# Patient Record
Sex: Female | Born: 1965
Health system: Southern US, Community
[De-identification: ages and names within clinical notes are randomized; demographics above are authoritative.]

## PROBLEM LIST (undated history)

## (undated) DIAGNOSIS — M329 Systemic lupus erythematosus, unspecified: Secondary | ICD-10-CM

## (undated) DIAGNOSIS — M069 Rheumatoid arthritis, unspecified: Secondary | ICD-10-CM

## (undated) DIAGNOSIS — D135 Benign neoplasm of extrahepatic bile ducts: Secondary | ICD-10-CM

## (undated) DIAGNOSIS — G43009 Migraine without aura, not intractable, without status migrainosus: Principal | ICD-10-CM

## (undated) DIAGNOSIS — R51 Headache: Secondary | ICD-10-CM

## (undated) DIAGNOSIS — M1990 Unspecified osteoarthritis, unspecified site: Secondary | ICD-10-CM

## (undated) DIAGNOSIS — R209 Unspecified disturbances of skin sensation: Secondary | ICD-10-CM

## (undated) DIAGNOSIS — R519 Headache, unspecified: Secondary | ICD-10-CM

## (undated) DIAGNOSIS — IMO0002 Reserved for concepts with insufficient information to code with codable children: Secondary | ICD-10-CM

## (undated) HISTORY — DX: Headache: R51

## (undated) HISTORY — PX: CARPAL TUNNEL RELEASE: SHX101

## (undated) HISTORY — DX: Unspecified osteoarthritis, unspecified site: M19.90

## (undated) HISTORY — DX: Headache, unspecified: R51.9

## (undated) HISTORY — DX: Rheumatoid arthritis, unspecified: M06.9

## (undated) HISTORY — DX: Migraine without aura, not intractable, without status migrainosus: G43.009

## (undated) HISTORY — DX: Benign neoplasm of extrahepatic bile ducts: D13.5

## (undated) HISTORY — DX: Unspecified disturbances of skin sensation: R20.9

---

## 2005-01-09 ENCOUNTER — Encounter: Admission: RE | Admit: 2005-01-09 | Discharge: 2005-01-09 | Payer: Self-pay | Admitting: Otolaryngology

## 2008-04-28 ENCOUNTER — Other Ambulatory Visit: Admission: RE | Admit: 2008-04-28 | Discharge: 2008-04-28 | Payer: Self-pay | Admitting: Gynecology

## 2012-09-19 ENCOUNTER — Ambulatory Visit (INDEPENDENT_AMBULATORY_CARE_PROVIDER_SITE_OTHER): Payer: PRIVATE HEALTH INSURANCE | Admitting: Family Medicine

## 2012-09-19 VITALS — BP 116/74 | HR 64 | Temp 97.6°F | Resp 16 | Ht 62.0 in | Wt 106.0 lb

## 2012-09-19 DIAGNOSIS — B009 Herpesviral infection, unspecified: Secondary | ICD-10-CM

## 2012-09-19 DIAGNOSIS — R21 Rash and other nonspecific skin eruption: Secondary | ICD-10-CM

## 2012-09-19 MED ORDER — VALACYCLOVIR HCL 1 G PO TABS: ORAL_TABLET | ORAL | Status: DC

## 2012-09-19 NOTE — Progress Notes (Signed)
  [facility]:  Office Visit  Chief Complaint:  Chief Complaint  Patient presents with  . Rash    bumps on lips x 2 days; painful    HPI: Kathleen Costa is a 47 y.o. female who complains of : Needs forms filled out for employment for health screening but declines bloodwork, scared of needles.  Painful vesicles, blisters on lips x 2 day. Started as 1 vesicles and now is all over upper and lower lips. Has had canker sores before inside mouth. Denies any genital herpes.   History reviewed. No pertinent past medical history. History reviewed. No pertinent past surgical history. History   Social History  . Marital Status: Single    Spouse Name: N/A    Number of Children: N/A  . Years of Education: N/A   Social History Main Topics  . Smoking status: Never Smoker   . Smokeless tobacco: None  . Alcohol Use: No  . Drug Use: No  . Sexually Active: None   Other Topics Concern  . None   Social History Narrative  . None   History reviewed. No pertinent family history. No Known Allergies Prior to Admission medications   Not on File     ROS: The patient denies fevers, chills, night sweats, unintentional weight loss, chest pain, palpitations, wheezing, dyspnea on exertion, nausea, vomiting, abdominal pain, dysuria, hematuria, melena, numbness, weakness, or tingling.   All other systems have been reviewed and were otherwise negative with the exception of those mentioned in the HPI and as above.    PHYSICAL EXAM: Filed Vitals:   09/19/12 1727  BP: 116/74  Pulse: 64  Temp: 97.6 F (36.4 C)  Resp: 16   Filed Vitals:   09/19/12 1727  Height: 5\' 2"  (1.575 m)  Weight: 106 lb (48.081 kg)   Body mass index is 19.39 kg/(m^2).  General: Alert, no acute distress HEENT:  Normocephalic, atraumatic, oropharynx patent.  Cardiovascular:  Regular rate and rhythm, no rubs murmurs or gallops.  No Carotid bruits, radial pulse intact. No pedal edema.  Respiratory:  Clear to auscultation bilaterally.  No wheezes, rales, or rhonchi.  No cyanosis, no use of accessory musculature GI: No organomegaly, abdomen is soft and non-tender, positive bowel sounds.  No masses. Skin: + fever blisters on upper and lower lip Neurologic: Facial musculature symmetric. Psychiatric: Patient is appropriate throughout our interaction. Lymphatic: No cervical lymphadenopathy Musculoskeletal: Gait intact.   LABS: No results found for this or any previous visit.   EKG/XRAY:   Primary read interpreted by Dr. Conley Rolls at [facility].   ASSESSMENT/PLAN: Encounter Diagnoses  Name Primary?  Marland Kitchen HSV-1 (herpes simplex virus 1) infection Yes  . Rash    Filled out form. Rx Valtrex OTC Abreva F/u prn     LE, THAO PHUONG, DO 09/19/2012 5:50 PM

## 2013-02-02 ENCOUNTER — Ambulatory Visit (INDEPENDENT_AMBULATORY_CARE_PROVIDER_SITE_OTHER): Payer: PRIVATE HEALTH INSURANCE | Admitting: Emergency Medicine

## 2013-02-02 VITALS — BP 90/60 | HR 88 | Temp 98.0°F | Resp 16 | Ht 61.0 in | Wt 99.0 lb

## 2013-02-02 DIAGNOSIS — M654 Radial styloid tenosynovitis [de Quervain]: Secondary | ICD-10-CM

## 2013-02-02 DIAGNOSIS — R112 Nausea with vomiting, unspecified: Secondary | ICD-10-CM

## 2013-02-02 LAB — COMPREHENSIVE METABOLIC PANEL
ALT: 14 U/L (ref 0–35)
AST: 21 U/L (ref 0–37)
Albumin: 4.4 g/dL (ref 3.5–5.2)
Alkaline Phosphatase: 84 U/L (ref 39–117)
BUN: 12 mg/dL (ref 6–23)
CO2: 23 mEq/L (ref 19–32)
Calcium: 9.1 mg/dL (ref 8.4–10.5)
Chloride: 105 mEq/L (ref 96–112)
Creat: 0.65 mg/dL (ref 0.50–1.10)
Glucose, Bld: 105 mg/dL — ABNORMAL HIGH (ref 70–99)
Potassium: 4.2 mEq/L (ref 3.5–5.3)
Sodium: 136 mEq/L (ref 135–145)
Total Bilirubin: 0.7 mg/dL (ref 0.3–1.2)
Total Protein: 8 g/dL (ref 6.0–8.3)

## 2013-02-02 LAB — POCT URINALYSIS DIPSTICK
Bilirubin, UA: NEGATIVE
Blood, UA: NEGATIVE
Glucose, UA: NEGATIVE
Ketones, UA: NEGATIVE
Leukocytes, UA: NEGATIVE
Nitrite, UA: NEGATIVE
Protein, UA: NEGATIVE
Spec Grav, UA: 1.02
Urobilinogen, UA: 0.2
pH, UA: 5.5

## 2013-02-02 LAB — POCT UA - MICROSCOPIC ONLY
Amorphous: POSITIVE
Bacteria, U Microscopic: NEGATIVE
Casts, Ur, LPF, POC: NEGATIVE
Crystals, Ur, HPF, POC: NEGATIVE
RBC, urine, microscopic: NEGATIVE
Yeast, UA: NEGATIVE

## 2013-02-02 LAB — POCT CBC
Granulocyte percent: 84.1 %G — AB (ref 37–80)
HCT, POC: 39.4 % (ref 37.7–47.9)
Hemoglobin: 12.4 g/dL (ref 12.2–16.2)
Lymph, poc: 1 (ref 0.6–3.4)
MCH, POC: 29.7 pg (ref 27–31.2)
MCHC: 31.5 g/dL — AB (ref 31.8–35.4)
MCV: 94.2 fL (ref 80–97)
MID (cbc): 0.3 (ref 0–0.9)
MPV: 8.6 fL (ref 0–99.8)
POC Granulocyte: 6.7 (ref 2–6.9)
POC LYMPH PERCENT: 12.6 %L (ref 10–50)
POC MID %: 3.3 %M (ref 0–12)
Platelet Count, POC: 212 10*3/uL (ref 142–424)
RBC: 4.18 M/uL (ref 4.04–5.48)
RDW, POC: 14 %
WBC: 8 10*3/uL (ref 4.6–10.2)

## 2013-02-02 LAB — TSH: TSH: 0.995 u[IU]/mL (ref 0.350–4.500)

## 2013-02-02 MED ORDER — MELOXICAM 15 MG PO TABS: 15.0000 mg | ORAL_TABLET | Freq: Every day | ORAL | Status: DC

## 2013-02-02 MED ORDER — ONDANSETRON 8 MG PO TBDP: 8.0000 mg | ORAL_TABLET | Freq: Three times a day (TID) | ORAL | Status: DC | PRN

## 2013-02-02 NOTE — Progress Notes (Signed)
[facility] [address] [place] [address] [phone]  Date:  02/02/2013   Name:  Kathleen Costa   DOB:  Mar 20, 1966   MRN:  [id number]  PCP:  No primary provider on file.    Chief Complaint: Nausea, Dizziness and Fatigue   History of Present Illness:  Kathleen Costa is a 47 y.o. very pleasant female patient who presents with the following:  Multiple concerns.  Has 3 week history of weakness, nausea, vomiting and fatigue.  No fever, chills, no food intolerance or indigestion.  No cough, wheezing or shortness of breath. No overuse of NSAID.  No abdominal distension or bloating.   Has pain in both wrists with lifting seen in past by :hand" specialist and given cortisone injections.  Now has pain in radial wrist with lifting.  No history of injury.  There are no active problems to display for this patient.   No past medical history on file.  No past surgical history on file.  History  Substance Use Topics  . Smoking status: Never Smoker   . Smokeless tobacco: Not on file  . Alcohol Use: No    No family history on file.  No Known Allergies  Medication list has been reviewed and updated.  Current Outpatient Prescriptions on File Prior to Visit  Medication Sig Dispense Refill  . valACYclovir (VALTREX) 1000 MG tablet Take 2 tabs PO every 12 hours, then stop.  4 tablet  0   No current facility-administered medications on file prior to visit.    Review of Systems:  As per HPI, otherwise negative.    Physical Examination: Filed Vitals:   02/02/13 1419  BP: 90/60  Pulse: 88  Temp: 98 F (36.7 C)  Resp: 16   Filed Vitals:   02/02/13 1419  Height: 5\' 1"  (1.549 m)  Weight: 99 lb (44.906 kg)   Body mass index is 18.72 kg/(m^2). Ideal Body Weight: Weight in (lb) to have BMI = 25: 132  GEN: WDWN, NAD, Non-toxic, A & O x 3 HEENT: Atraumatic, Normocephalic. Neck supple. No masses, No LAD. Ears and Nose: No external deformity. CV: RRR,  No M/G/R. No JVD. No thrill. No extra heart sounds. PULM: CTA B, no wheezes, crackles, rhonchi. No retractions. No resp. distress. No accessory muscle use. ABD: S, NT, ND, +BS. No rebound. No HSM. EXTR: No c/c/e.  Bilateral Positive Finklestein test. NEURO Normal gait.  PSYCH: Normally interactive. Conversant. Not depressed or anxious appearing.  Calm demeanor.    Assessment and Plan: De quervain's tenosynovitis Fatigue and weakness Labs Mobic   Signed,  Phillips Odor, MD   Results for orders placed in visit on 02/02/13  POCT CBC      Result Value Range   WBC 8.0  4.6 - 10.2 K/uL   Lymph, poc 1.0  0.6 - 3.4   POC LYMPH PERCENT 12.6  10 - 50 %L   MID (cbc) 0.3  0 - 0.9   POC MID % 3.3  0 - 12 %M   POC Granulocyte 6.7  2 - 6.9   Granulocyte percent 84.1 (*) 37 - 80 %G   RBC 4.18  4.04 - 5.48 M/uL   Hemoglobin 12.4  12.2 - 16.2 g/dL   HCT, POC 29.5  62.1 - 47.9 %   MCV 94.2  80 - 97 fL   MCH, POC 29.7  27 - 31.2 pg   MCHC 31.5 (*) 31.8 - 35.4 g/dL   RDW, POC 14.0  Platelet Count, POC 212  142 - 424 K/uL   MPV 8.6  0 - 99.8 fL  POCT UA - MICROSCOPIC ONLY      Result Value Range   WBC, Ur, HPF, POC 1-3     RBC, urine, microscopic neg     Bacteria, U Microscopic neg     Mucus, UA large     Epithelial cells, urine per micros 2-4     Crystals, Ur, HPF, POC neg     Casts, Ur, LPF, POC neg     Yeast, UA neg     Amorphous positive    POCT URINALYSIS DIPSTICK      Result Value Range   Color, UA yellow     Clarity, UA clear     Glucose, UA neg     Bilirubin, UA neg     Ketones, UA neg     Spec Grav, UA 1.020     Blood, UA neg     pH, UA 5.5     Protein, UA neg     Urobilinogen, UA 0.2     Nitrite, UA neg     Leukocytes, UA Negative

## 2013-02-02 NOTE — Patient Instructions (Addendum)
De Quervain's Tenosynovitis  De Quervain's tenosynovitis involves inflammation of one or two tendon linings (sheaths) or strain of one or two tendons to the thumb: extensor pollicis brevis (EPB), or abductor pollicis longus (APL). This causes pain on the side of the wrist and base of the thumb. Tendon sheaths secrete a fluid that lubricates the tendon, allowing the tendon to move smoothly. When the sheath becomes inflamed, the tendon cannot move freely in the sheath. Both the EPB and APL tendons are important for proper use of the hand. The EPB tendon is important for straightening the thumb. The APL tendon is important for moving the thumb away from the index finger (abducting). The two tendons pass through a small tube (canal) in the wrist, near the base of the thumb. When the tendons become inflamed, pain is usually felt in this area.  SYMPTOMS   · Pain, tenderness, swelling, warmth, or redness over the base of the thumb and thumb side of the wrist.  · Pain that gets worse when straightening the thumb.  · Pain that gets worse when moving the thumb away from the index finger, against resistance.  · Pain with pinching or gripping.  · Locking or catching of the thumb.  · Limited motion of the thumb.  · Crackling sound (crepitation) when the tendon or thumb is moved or touched.  · Fluid-filled cyst in the area of the base of the thumb.  CAUSES   · Tenosynovitis is often linked with overuse of the wrist.  · Tenosynovitis may be caused by repeated injury to the thumb muscle and tendon units, and with repeated motions of the hand and wrist, due to friction of the tendon within the lining (sheath).  · Tenosynovitis may also be due to a sudden increase in activity or change in activity.  RISK INCREASES WITH:  · Sports that involve repeated hand and wrist motions (golf, bowling, tennis, squash, racquetball).  · Heavy labor.  · Poor physical wrist strength and flexibility.  · Failure to warm up properly before practice or  play.  · Female gender.  · New mothers who hold their baby's head for long periods or lift infants with thumbs in the infant's armpit (axilla).  PREVENTION  · Warm up and stretch properly before practice or competition.  · Allow enough time for rest and recovery between practices and competition.  · Maintain appropriate conditioning:  · Cardiovascular fitness.  · Forearm, wrist, and hand flexibility.  · Muscle strength and endurance.  · Use proper exercise technique.  PROGNOSIS   This condition is usually curable within 6 weeks, if treated properly with non-surgical treatment and resting of the affected area.   RELATED COMPLICATIONS   · Longer healing time if not properly treated or if not given enough time to heal.  · Chronic inflammation, causing recurring symptoms of tenosynovitis. Permanent pain or restriction of movement.  · Risks of surgery: infection, bleeding, injury to nerves (numbness of the thumb), continued pain, incomplete release of the tendon sheath, recurring symptoms, cutting of the tendons, tendons sliding out of position, weakness of the thumb, thumb stiffness.  TREATMENT   First, treatment involves the use of medicine and ice, to reduce pain and inflammation. Patients are encouraged to stop or modify activities that aggravate the injury. Stretching and strengthening exercises may be advised. Exercises may be completed at home or with a therapist. You may be fitted with a brace or splint, to limit motion and allow the injury to heal. Your caregiver   may also choose to give you a corticosteroid injection, to reduce the pain and inflammation. If non-surgical treatment is not successful, surgery may be needed. Most tenosynovitis surgeries are done as outpatient procedures (you go home the same day). Surgery may involve local, regional (whole arm), or general anesthesia.   MEDICATION   · If pain medicine is needed, nonsteroidal anti-inflammatory medicines (aspirin and ibuprofen), or other minor pain  relievers (acetaminophen), are often advised.  · Do not take pain medicine for 7 days before surgery.  · Prescription pain relievers are often prescribed only after surgery. Use only as directed and only as much as you need.  · Corticosteroid injections may be given if your caregiver thinks they are needed. There is a limited number of times these injections may be given.  COLD THERAPY   · Cold treatment (icing) should be applied for 10 to 15 minutes every 2 to 3 hours for inflammation and pain, and immediately after activity that aggravates your symptoms. Use ice packs or an ice massage.  SEEK MEDICAL CARE IF:   · Symptoms get worse or do not improve in 2 to 4 weeks, despite treatment.  · You experience pain, numbness, or coldness in the hand.  · Blue, gray, or dark color appears in the fingernails.  · Any of the following occur after surgery: increased pain, swelling, redness, drainage of fluids, bleeding in the affected area, or signs of infection.  · New, unexplained symptoms develop. (Drugs used in treatment may produce side effects.)  Document Released: 08/13/2005 Document Revised: 11/05/2011 Document Reviewed: 11/25/2008  Exit[employer]® Patient Information ©2014 [employer].

## 2013-02-03 ENCOUNTER — Encounter: Payer: Self-pay | Admitting: *Deleted

## 2014-01-04 ENCOUNTER — Ambulatory Visit (INDEPENDENT_AMBULATORY_CARE_PROVIDER_SITE_OTHER): Payer: 59 | Admitting: Physician Assistant

## 2014-01-04 VITALS — BP 98/60 | HR 69 | Temp 98.1°F | Resp 16 | Ht 61.5 in | Wt 109.0 lb

## 2014-01-04 DIAGNOSIS — L237 Allergic contact dermatitis due to plants, except food: Secondary | ICD-10-CM

## 2014-01-04 DIAGNOSIS — L299 Pruritus, unspecified: Secondary | ICD-10-CM

## 2014-01-04 DIAGNOSIS — L255 Unspecified contact dermatitis due to plants, except food: Secondary | ICD-10-CM

## 2014-01-04 MED ORDER — METHYLPREDNISOLONE ACETATE 80 MG/ML IJ SUSP
80.0000 mg | Freq: Once | INTRAMUSCULAR | Status: AC
  Administered 2014-01-04: 80 mg via INTRAMUSCULAR

## 2014-01-04 NOTE — Progress Notes (Signed)
   Subjective:    Patient ID: Kathleen Costa, female    DOB: [birth year], 48 y.o.   MRN: [id number]  HPI Primary Physician: No primary provider on file.  Chief Complaint: Rash x 2 day  HPI: 48 y.o. female with history below presents with 2 day history of pruritic rash along the left inferior mandible, right abdomen, and left lateral chest wall. Patient was helping to pull weeds 2 days ago while a tree service was grinding a tree stump. Patient states she is known for a strong reaction to poison ivy and the only thing that works for her is "the shot." Has tried OTC lotions without success. Afebrile. No chills.    Past Medical History  Diagnosis Date  . Arthritis      Home Meds: Prior to Admission medications   Medication Sig Start Date End Date Taking? Authorizing Provider                       Enbrel  -She is uncertain of her dose  -Every 14 days  Allergies: No Known Allergies  History   Social History  . Marital Status: Single    Spouse Name: N/A    Number of Children: N/A  . Years of Education: N/A   Occupational History  . Not on file.   Social History Main Topics  . Smoking status: Never Smoker   . Smokeless tobacco: Not on file  . Alcohol Use: No  . Drug Use: No  . Sexual Activity: Not on file   Other Topics Concern  . Not on file   Social History Narrative  . No narrative on file     Review of Systems  Constitutional: Negative for fever, chills and fatigue.  Eyes: Negative for visual disturbance.  Respiratory: Negative for cough and choking.   Skin: Positive for rash. Negative for pallor and wound.       Objective:   Physical Exam  Physical Exam: Blood pressure 98/60, pulse 69, temperature 98.1 F (36.7 C), temperature source Oral, resp. rate 16, height 5' 1.5" (1.562 m), weight 109 lb (49.442 kg), SpO2 100.00%., Body mass index is 20.26 kg/(m^2). General: Well developed, well nourished, in no acute distress. Head: Normocephalic, atraumatic,  eyes without discharge, sclera non-icteric, nares are without discharge.    Neck: Supple. Full ROM. No lymphadenopathy. Lungs: Clear bilaterally to auscultation without wheezes, rales, or rhonchi. Breathing is unlabored. Heart: RRR with S1 S2. No murmurs, rubs, or gallops appreciated. Msk:  Strength and tone normal for age. Extremities/Skin: Warm and dry. No clubbing or cyanosis. No edema. Mildly erythematous rash along the left inferior aspect of the left mandible, right abdomen, and left lateral chest wall. No vesicles or pustules are present. No secondary infection.  Neuro: Alert and oriented X 3. Moves all extremities spontaneously. Gait is normal. CNII-XII grossly in tact. Psych:  Responds to questions appropriately with a normal affect.        Assessment & Plan:  48 year old female with poison ivy -DepoMedrol 80 mg IM -She has already washed herself and all clothes and linens  -RTC precautions -RTC prn   Christell Faith, MHS, [name] [facility] [address] [place], [place] [address] [place] 01/04/2014 6:14 PM

## 2014-02-25 ENCOUNTER — Encounter: Payer: Self-pay | Admitting: Podiatry

## 2014-02-25 ENCOUNTER — Ambulatory Visit (INDEPENDENT_AMBULATORY_CARE_PROVIDER_SITE_OTHER): Payer: 59 | Admitting: Podiatry

## 2014-02-25 ENCOUNTER — Telehealth: Payer: Self-pay | Admitting: Podiatry

## 2014-02-25 VITALS — Ht 61.0 in | Wt 108.0 lb

## 2014-02-25 DIAGNOSIS — L608 Other nail disorders: Secondary | ICD-10-CM

## 2014-02-25 NOTE — Telephone Encounter (Signed)
Mycotic nails sent to [facility] for a fungal culture/pas tain  [facility] [address]  [place], [place]  [phone]

## 2014-02-25 NOTE — Progress Notes (Signed)
   Subjective:    Patient ID: Kathleen Costa, female    DOB: 1965/11/21, 48 y.o.   MRN: [id number]  HPI Comments: Both great toenails are black, just noticed it after taken the nail polish off of it, its been a long time      Review of Systems  Musculoskeletal: Positive for back pain.  All other systems reviewed and are negative.      Objective:   Physical Exam: I have reviewed her past medical history medications allergies surgeries social history and review of systems. Pulses are palpable bilateral. Neurologic sensorium is intact per since once the monofilament. Deep tendon reflexes are intact bilateral. Muscle strength is 5 over 5 dorsiflexors plantar flexors inverters everters all intrinsic musculature is intact. Orthopedic evaluation demonstrates all joints distal to the ankle a full range of motion without crepitation. Cutaneous evaluation demonstrates dystrophic nails hallux bilateral. She has a artificial nail on the right hallux which does obscure were the underlying infection. The left foot demonstrates a greenish brown discoloration with a normal thickness nail plate.        Assessment & Plan:  Assessment: Dystrophic nails hallux bilateral rule out onychomycosis.  Plan: Samples of the skin a nails were taken today and sent for pathologic evaluation

## 2014-03-11 ENCOUNTER — Encounter: Payer: Self-pay | Admitting: Podiatry

## 2014-03-23 ENCOUNTER — Ambulatory Visit (INDEPENDENT_AMBULATORY_CARE_PROVIDER_SITE_OTHER): Payer: 59 | Admitting: Podiatry

## 2014-03-23 DIAGNOSIS — Z79899 Other long term (current) drug therapy: Secondary | ICD-10-CM

## 2014-03-23 LAB — HEPATIC FUNCTION PANEL
ALT: 21 U/L (ref 0–35)
AST: 26 U/L (ref 0–37)
Albumin: 4.2 g/dL (ref 3.5–5.2)
Alkaline Phosphatase: 79 U/L (ref 39–117)
Bilirubin, Direct: 0.1 mg/dL (ref 0.0–0.3)
Indirect Bilirubin: 0.4 mg/dL (ref 0.2–1.2)
Total Bilirubin: 0.5 mg/dL (ref 0.2–1.2)
Total Protein: 7.6 g/dL (ref 6.0–8.3)

## 2014-03-23 LAB — CBC WITH DIFFERENTIAL/PLATELET
Basophils Absolute: 0 10*3/uL (ref 0.0–0.1)
Basophils Relative: 1 % (ref 0–1)
Eosinophils Absolute: 0 10*3/uL (ref 0.0–0.7)
Eosinophils Relative: 1 % (ref 0–5)
HCT: 36.7 % (ref 36.0–46.0)
Hemoglobin: 12.5 g/dL (ref 12.0–15.0)
Lymphocytes Relative: 47 % — ABNORMAL HIGH (ref 12–46)
Lymphs Abs: 1.6 10*3/uL (ref 0.7–4.0)
MCH: 30 pg (ref 26.0–34.0)
MCHC: 34.1 g/dL (ref 30.0–36.0)
MCV: 88 fL (ref 78.0–100.0)
Monocytes Absolute: 0.3 10*3/uL (ref 0.1–1.0)
Monocytes Relative: 9 % (ref 3–12)
Neutro Abs: 1.5 10*3/uL — ABNORMAL LOW (ref 1.7–7.7)
Neutrophils Relative %: 42 % — ABNORMAL LOW (ref 43–77)
Platelets: 229 10*3/uL (ref 150–400)
RBC: 4.17 MIL/uL (ref 3.87–5.11)
RDW: 14.4 % (ref 11.5–15.5)
WBC: 3.5 10*3/uL — ABNORMAL LOW (ref 4.0–10.5)

## 2014-03-23 MED ORDER — TERBINAFINE HCL 250 MG PO TABS: 250.0000 mg | ORAL_TABLET | Freq: Every day | ORAL | Status: DC

## 2014-03-23 NOTE — Progress Notes (Signed)
She presents today for positive fungal culture.  Objective: Onychomycosis.  Assessment: Onychomycosis.  Plan: Start her on Lamisil 250 mg tablets 1 by mouth daily after liver profile was performed. I will followup with her should her blood work come back abnormal. Otherwise I will see her in one month.

## 2014-03-30 ENCOUNTER — Telehealth: Payer: Self-pay | Admitting: *Deleted

## 2014-03-30 NOTE — Telephone Encounter (Signed)
I called and left the patient a message with her daughter.  I asked her to tell her mother that her labwork was okay, continue current medication per Dr. Milinda Pointer.

## 2014-03-30 NOTE — Telephone Encounter (Signed)
Message copied by Lolita Rieger on Tue Mar 30, 2014  9:06 AM ------      Message from: Clovis Riley E      Created: Mon Mar 29, 2014  4:44 PM                   ----- Message -----         From: Garrel Ridgel, DPM         Sent: 03/24/2014   6:43 AM           To: Avanell Shackleton Prevette, PMAC            Blood work looks good and may continue current medication schedule. ------

## 2014-04-20 ENCOUNTER — Ambulatory Visit (INDEPENDENT_AMBULATORY_CARE_PROVIDER_SITE_OTHER): Payer: 59 | Admitting: Podiatry

## 2014-04-20 ENCOUNTER — Encounter: Payer: Self-pay | Admitting: *Deleted

## 2014-04-20 VITALS — BP 108/68 | HR 76 | Resp 16

## 2014-04-20 DIAGNOSIS — Z79899 Other long term (current) drug therapy: Secondary | ICD-10-CM

## 2014-04-20 MED ORDER — TERBINAFINE HCL 250 MG PO TABS: 250.0000 mg | ORAL_TABLET | Freq: Every day | ORAL | Status: DC

## 2014-04-20 NOTE — Progress Notes (Signed)
She presents today for followup of her Lamisil therapy states she had no problems taking the medication.  Objective: Vital systems is alert and oriented x3 onychomycosis.  Assessment onychomycosis.  Plan: Discussed etiology pathology conservative versus surgical therapies we sent her out for another liver profile and CBC. Lamisil was prescribed 250 mg tablets #91 by mouth daily. I will followup with her in 4 months.

## 2014-04-21 LAB — HEPATIC FUNCTION PANEL
ALT: 14 U/L (ref 0–35)
AST: 24 U/L (ref 0–37)
Albumin: 4.2 g/dL (ref 3.5–5.2)
Alkaline Phosphatase: 70 U/L (ref 39–117)
Bilirubin, Direct: 0.1 mg/dL (ref 0.0–0.3)
Indirect Bilirubin: 0.5 mg/dL (ref 0.2–1.2)
Total Bilirubin: 0.6 mg/dL (ref 0.2–1.2)
Total Protein: 7.4 g/dL (ref 6.0–8.3)

## 2014-04-22 ENCOUNTER — Telehealth: Payer: Self-pay | Admitting: *Deleted

## 2014-04-22 ENCOUNTER — Encounter: Payer: Self-pay | Admitting: *Deleted

## 2014-04-22 ENCOUNTER — Ambulatory Visit (INDEPENDENT_AMBULATORY_CARE_PROVIDER_SITE_OTHER): Payer: 59 | Admitting: Neurology

## 2014-04-22 ENCOUNTER — Encounter: Payer: Self-pay | Admitting: Neurology

## 2014-04-22 VITALS — BP 114/73 | HR 66 | Ht 62.0 in | Wt 104.0 lb

## 2014-04-22 DIAGNOSIS — G43009 Migraine without aura, not intractable, without status migrainosus: Secondary | ICD-10-CM

## 2014-04-22 DIAGNOSIS — G43019 Migraine without aura, intractable, without status migrainosus: Secondary | ICD-10-CM | POA: Insufficient documentation

## 2014-04-22 HISTORY — DX: Migraine without aura, not intractable, without status migrainosus: G43.009

## 2014-04-22 MED ORDER — ELETRIPTAN HYDROBROMIDE 40 MG PO TABS: 40.0000 mg | ORAL_TABLET | Freq: Two times a day (BID) | ORAL | Status: DC | PRN

## 2014-04-22 MED ORDER — ZONISAMIDE 50 MG PO CAPS: ORAL_CAPSULE | ORAL | Status: DC

## 2014-04-22 NOTE — Patient Instructions (Signed)

## 2014-04-22 NOTE — Progress Notes (Signed)
Reason for visit: Migraine headache  Kathleen Costa is a 48 y.o. female  History of present illness:  Kathleen Costa is a 48 year old Asian female right-handed, with a history of migraine headaches that began approximately one year ago. The patient denies any pre-existing history of headaches. The patient had 3 headaches last year, but over the last month or so, her headaches have become much more frequent, occurring 3 or 4 times a month. The patient indicates that the headaches begin in the right occipital area and then move forward. She denies any nausea or vomiting, but she does have photophobia and phonophobia, and she may have some blurring of vision with the headache. The patient denies any focal numbness or weakness of the face, arms, or legs. The patient works as a Glass blower/designer, and she indicates that the loud noises at work bother her, and can bring on the headache. The patient has a very prominent history of migraine running in the family, with headaches in the mother, and least 3 or 4 siblings. The patient has been given a trial on Topamax, but she could not tolerate this as it made her "tremor". The patient takes Relpax if the headache comes on, and this is helpful. The patient has had MRI evaluation of the brain which is brought for my review. The brain itself appears to be unremarkable. The scan was read out as having "empty sella syndrome".    Past Medical History  Diagnosis Date  . Arthritis   . Headache   . Disturbance of skin sensation   . Rheumatoid arthritis   . Migraine without aura, without mention of intractable migraine without mention of status migrainosus 04/22/2014    History reviewed. No pertinent past surgical history.  Family History  Problem Relation Age of Onset  . Migraines Mother   . Migraines Sister   . Dementia Brother   . Migraines Brother   . Migraines Brother   . Cancer - Lung Brother     Social history:  reports that she has never smoked. She  has never used smokeless tobacco. She reports that she does not drink alcohol or use illicit drugs.  Medications:  Current Outpatient Prescriptions on File Prior to Visit  Medication Sig Dispense Refill  . terbinafine (LAMISIL) 250 MG tablet Take 1 tablet (250 mg total) by mouth daily.  90 tablet  0  . valACYclovir (VALTREX) 1000 MG tablet Take 2 tabs PO every 12 hours, then stop.  4 tablet  0   No current facility-administered medications on file prior to visit.     No Known Allergies  ROS:  Out of a complete 14 system review of symptoms, the patient complains only of the following symptoms, and all other reviewed systems are negative.  Blurred vision Memory loss, headache, dizziness  Blood pressure 114/73, pulse 66, height 5\' 2"  (1.575 m), weight 104 lb (47.174 kg).  Physical Exam  General: The patient is alert and cooperative at the time of the examination.  Eyes: Pupils are equal, round, and reactive to light. Discs are flat bilaterally.  Neck: The neck is supple, no carotid bruits are noted.  Respiratory: The respiratory examination is clear.  Cardiovascular: The cardiovascular examination reveals a regular rate and rhythm, no obvious murmurs or rubs are noted.  Skin: Extremities are without significant edema.  Neurologic Exam  Mental status: The patient is alert and oriented x 3 at the time of the examination. The patient has apparent normal recent and remote memory,  with an apparently normal attention span and concentration ability.  Cranial nerves: Facial symmetry is present. There is good sensation of the face to pinprick and soft touch bilaterally. The strength of the facial muscles and the muscles to head turning and shoulder shrug are normal bilaterally. Speech is well enunciated, no aphasia or dysarthria is noted. Extraocular movements are full. Visual fields are full. The tongue is midline, and the patient has symmetric elevation of the soft palate. No obvious  hearing deficits are noted.  Motor: The motor testing reveals 5 over 5 strength of all 4 extremities. Good symmetric motor tone is noted throughout.  Sensory: Sensory testing is intact to pinprick, soft touch, vibration sensation, and position sense on all 4 extremities. No evidence of extinction is noted.  Coordination: Cerebellar testing reveals good finger-nose-finger and heel-to-shin bilaterally.  Gait and station: Gait is normal. Tandem gait is normal. Romberg is negative. No drift is seen.  Reflexes: Deep tendon reflexes are symmetric and normal bilaterally. Toes are downgoing bilaterally.   Assessment/Plan:  One. Migraine headache  The patient has a typical history for migraine, and she has a very prominent family history of migraine headache as well. The patient could not tolerate the Topamax. The patient will be given a trial on Zonegran. If the patient is already on a beta blocker, atenolol. He will followup in about 3 months. The patient appears to need better ear protection while at work to prevent the loud noises from bother her. The empty sella syndrome seen by MRI of the brain is likely of no significance.  Kathleen Alexanders MD 04/22/2014 8:08 PM  [facility] [address] [place], [place] [address]  Phone [phone] Fax [phone]

## 2014-04-22 NOTE — Telephone Encounter (Signed)
Message copied by Lolita Rieger on Thu Apr 22, 2014 12:17 PM ------      Message from: Clovis Riley E      Created: Wed Apr 21, 2014  5:08 PM                   ----- Message -----         From: Garrel Ridgel, DPM         Sent: 04/21/2014  12:07 PM           To: Avanell Shackleton Prevette, PMAC            Blood work looks good.  Continue medication. ------

## 2014-04-22 NOTE — Telephone Encounter (Signed)
I called and left her a message that labs look good, okay to continue medication.

## 2014-09-08 ENCOUNTER — Ambulatory Visit: Payer: 59 | Admitting: Neurology

## 2014-09-08 ENCOUNTER — Telehealth: Payer: Self-pay | Admitting: Neurology

## 2014-09-08 NOTE — Telephone Encounter (Signed)
This patient did not show for a revisit appointment today. 

## 2015-02-14 ENCOUNTER — Ambulatory Visit (INDEPENDENT_AMBULATORY_CARE_PROVIDER_SITE_OTHER): Payer: 59 | Admitting: Adult Health

## 2015-02-14 ENCOUNTER — Encounter: Payer: Self-pay | Admitting: Adult Health

## 2015-02-14 VITALS — BP 100/64 | HR 80 | Ht 62.0 in | Wt 105.0 lb

## 2015-02-14 DIAGNOSIS — G43019 Migraine without aura, intractable, without status migrainosus: Secondary | ICD-10-CM | POA: Diagnosis not present

## 2015-02-14 MED ORDER — NORTRIPTYLINE HCL 10 MG PO CAPS: 10.0000 mg | ORAL_CAPSULE | Freq: Every day | ORAL | Status: DC

## 2015-02-14 NOTE — Progress Notes (Signed)
PATIENT: Kathleen Costa DOB: [birth year]  REASON FOR VISIT: follow up- Migraine HISTORY FROM: patient  HISTORY OF PRESENT ILLNESS Kathleen Costa is a 49 year old Asian female with a history of migraines. She returns today for follow-up. The patient is currently taken zonegran 100 mg at bedtime. She states this has helped her migraines however she is very sleepy on this medication. She states that she has 2 headaches a week. Her headaches normally are associated with nausea and vomiting. She also has photophobia and phonophobia. In the environment that she works in the [place] make a very loud noises which makes her headaches occur or worse. She states that she does wear ear plugs but does not find that beneficial. She states that she will also have a buzzing that will occur in both ears intermittently. She is not sure if this is due to the noise level at her job. She states that she would like to try a different medication of than zonegran to help with her headaches. She returns today for an evaluation  HISTORY 04/22/14 [facility]): Kathleen Costa is a 49 year old Asian female right-handed, with a history of migraine headaches that began approximately one year ago. The patient denies any pre-existing history of headaches. The patient had 3 headaches last year, but over the last month or so, her headaches have become much more frequent, occurring 3 or 4 times a month. The patient indicates that the headaches begin in the right occipital area and then move forward. She denies any nausea or vomiting, but she does have photophobia and phonophobia, and she may have some blurring of vision with the headache. The patient denies any focal numbness or weakness of the face, arms, or legs. The patient works as a Glass blower/designer, and she indicates that the loud noises at work bother her, and can bring on the headache. The patient has a very prominent history of migraine running in the family, with headaches in the mother, and  least 3 or 4 siblings. The patient has been given a trial on Topamax, but she could not tolerate this as it made her "tremor". The patient takes Relpax if the headache comes on, and this is helpful. The patient has had MRI evaluation of the brain which is brought for my review. The brain itself appears to be unremarkable. The scan was read out as having "empty sella syndrome".  REVIEW OF SYSTEMS: Out of a complete 14 system review of symptoms, the patient complains only of the following symptoms, and all other reviewed systems are negative.  See history of present illness  ALLERGIES: No Known Allergies  HOME MEDICATIONS: Outpatient Prescriptions Prior to Visit  Medication Sig Dispense Refill  . Adalimumab 40 MG/0.8ML PSKT Inject 40 mg into the skin once.    Marland Kitchen atenolol (TENORMIN) 25 MG tablet Take 25 mg by mouth daily.    Marland Kitchen eletriptan (RELPAX) 40 MG tablet Take 1 tablet (40 mg total) by mouth 2 (two) times daily as needed. 10 tablet 5  . HYDROcodone-acetaminophen (NORCO/VICODIN) 5-325 MG per tablet Take 1 tablet by mouth every 6 (six) hours.    Marland Kitchen leflunomide (ARAVA) 20 MG tablet Take 20 mg by mouth daily.    Marland Kitchen oxyCODONE-acetaminophen (PERCOCET/ROXICET) 5-325 MG per tablet Take 1 tablet by mouth every 6 (six) hours.    . predniSONE (DELTASONE) 5 MG tablet Take 5 mg by mouth 2 (two) times daily with a meal.    . terbinafine (LAMISIL) 250 MG tablet Take 1 tablet (250 mg total)  by mouth daily. 90 tablet 0  . valACYclovir (VALTREX) 1000 MG tablet Take 2 tabs PO every 12 hours, then stop. 4 tablet 0  . zonisamide (ZONEGRAN) 50 MG capsule One capsule at night for 2 weeks, then take 2 tablet at night 60 capsule 3   No facility-administered medications prior to visit.    PAST MEDICAL HISTORY: Past Medical History  Diagnosis Date  . Arthritis   . Headache   . Disturbance of skin sensation   . Rheumatoid arthritis   . Migraine without aura, without mention of intractable migraine without mention  of status migrainosus 04/22/2014    PAST SURGICAL HISTORY: History reviewed. No pertinent past surgical history.  FAMILY HISTORY: Family History  Problem Relation Age of Onset  . Migraines Mother   . Migraines Sister   . Dementia Brother   . Migraines Brother   . Migraines Brother   . Cancer - Lung Brother     SOCIAL HISTORY: History   Social History  . Marital Status: Single    Spouse Name: N/A  . Number of Children: 2  . Years of Education: college-hs   Occupational History  . Airline pilot   Social History Main Topics  . Smoking status: Never Smoker   . Smokeless tobacco: Never Used  . Alcohol Use: No  . Drug Use: No  . Sexual Activity: Not on file   Other Topics Concern  . Not on file   Social History Narrative      PHYSICAL EXAM  Filed Vitals:   02/14/15 1511  BP: 100/64  Pulse: 80  Height: 5\' 2"  (1.575 m)  Weight: 105 lb (47.628 kg)   Body mass index is 19.2 kg/(m^2).  Generalized: Well developed, in no acute distress   Neurological examination  Mentation: Alert oriented to time, place, history taking. Follows all commands speech and language fluent Cranial nerve II-XII: Pupils were equal round reactive to light. Extraocular movements were full, visual field were full on confrontational test. Facial sensation and strength were normal. Uvula tongue midline. Head turning and shoulder shrug  were normal and symmetric. Motor: The motor testing reveals 5 over 5 strength of all 4 extremities. Good symmetric motor tone is noted throughout.  Sensory: Sensory testing is intact to soft touch on all 4 extremities. No evidence of extinction is noted.  Coordination: Cerebellar testing reveals good finger-nose-finger and heel-to-shin bilaterally.  Gait and station: Gait is normal. Tandem gait is normal. Romberg is negative. No drift is seen.  Reflexes: Deep tendon reflexes are symmetric and normal bilaterally.    DIAGNOSTIC DATA (LABS,  IMAGING, TESTING) - I reviewed patient records, labs, notes, testing and imaging myself where available.     ASSESSMENT AND PLAN 49 y.o. year old female  has a past medical history of Arthritis; Headache; Disturbance of skin sensation; Rheumatoid arthritis; and Migraine without aura, without mention of intractable migraine without mention of status migrainosus (04/22/2014). here with:  1. Migraine  Patient is having migraines twice a week. She states that the Anna makes her very sleepy. At this time I will wean the patient off the [place] and started her on nortriptyline. She will take nortriptyline 10 mg 1 tablet at bedtime. I have advised the patient to avoid taking nortriptyline with her pain medication. Have also advised the patient that if she experiences constipation she should let us know. Patient verbalized understanding. I also reviewed the side effects of nortriptyline with the patient and provided her with a handout. Patient  advised that if her symptoms do not improve she she'll let us know. Otherwise she will follow-up in 3 months with Dr. Jannifer Franklin.   Ward Givens, MSN, NP-C 02/14/2015, 3:15 PM [facility] [address], [place], [place] [address] [phone]  Note: This document was prepared with digital dictation and possible smart phrase technology. Any transcriptional errors that result from this process are unintentional.

## 2015-02-14 NOTE — Patient Instructions (Signed)
Wean off Zonegran- Decrease to 1 tablet at night for 1 week then stop this medication.  Start Nortriptyline once you have stopped Zonegran.  Avoid taking pain medication at the same time as nortriptyline.  If constipation occurs let us know.   Nortriptyline capsules What is this medicine? NORTRIPTYLINE (nor TRIP ti leen) is used to treat depression. This medicine may be used for other purposes; ask your health care provider or pharmacist if you have questions. COMMON BRAND NAME(S): Aventyl, Pamelor What should I tell my health care provider before I take this medicine? They need to know if you have any of these conditions: -an alcohol problem -bipolar disorder or schizophrenia -difficulty passing urine, prostate trouble -glaucoma -heart disease or recent heart attack -liver disease -over active thyroid -seizures -thoughts or plans of suicide or a previous suicide attempt or family history of suicide attempt -an unusual or allergic reaction to nortriptyline, other medicines, foods, dyes, or preservatives -pregnant or trying to get pregnant -breast-feeding How should I use this medicine? Take this medicine by mouth with a glass of water. Follow the directions on the prescription label. Take your doses at regular intervals. Do not take it more often than directed. Do not stop taking this medicine suddenly except upon the advice of your doctor. Stopping this medicine too quickly may cause serious side effects or your condition may worsen. A special MedGuide will be given to you by the pharmacist with each prescription and refill. Be sure to read this information carefully each time. Talk to your pediatrician regarding the use of this medicine in children. Special care may be needed. Overdosage: If you think you have taken too much of this medicine contact a poison control center or emergency room at once. NOTE: This medicine is only for you. Do not share this medicine with others. What if I  miss a dose? If you miss a dose, take it as soon as you can. If it is almost time for your next dose, take only that dose. Do not take double or extra doses. What may interact with this medicine? Do not take this medicine with any of the following medications: -arsenic trioxide -certain medicines medicines for irregular heart beat -cisapride -halofantrine -linezolid -MAOIs like Carbex, Eldepryl, Marplan, Nardil, and Parnate -methylene blue (injected into a vein) -other medicines for mental depression -phenothiazines like perphenazine, thioridazine and chlorpromazine -pimozide -probucol -procarbazine -sparfloxacin -St. John's Wort -ziprasidone This medicine may also interact with any of the following medications: -atropine and related drugs like hyoscyamine, scopolamine, tolterodine and others -barbiturate medicines for inducing sleep or treating seizures, such as phenobarbital -cimetidine -medicines for diabetes -medicines for seizures like carbamazepine or phenytoin -reserpine -thyroid medicine This list may not describe all possible interactions. Give your health care provider a list of all the medicines, herbs, non-prescription drugs, or dietary supplements you use. Also tell them if you smoke, drink alcohol, or use illegal drugs. Some items may interact with your medicine. What should I watch for while using this medicine? Tell your doctor if your symptoms do not get better or if they get worse. Visit your doctor or health care professional for regular checks on your progress. Because it may take several weeks to see the full effects of this medicine, it is important to continue your treatment as prescribed by your doctor. Patients and their families should watch out for new or worsening thoughts of suicide or depression. Also watch out for sudden changes in feelings such as feeling anxious, agitated, panicky, irritable,  hostile, aggressive, impulsive, severely restless, overly  excited and hyperactive, or not being able to sleep. If this happens, especially at the beginning of treatment or after a change in dose, call your health care professional. Dennis Bast may get drowsy or dizzy. Do not drive, use machinery, or do anything that needs mental alertness until you know how this medicine affects you. Do not stand or sit up quickly, especially if you are an older patient. This reduces the risk of dizzy or fainting spells. Alcohol may interfere with the effect of this medicine. Avoid alcoholic drinks. Do not treat yourself for coughs, colds, or allergies without asking your doctor or health care professional for advice. Some ingredients can increase possible side effects. Your mouth may get dry. Chewing sugarless gum or sucking hard candy, and drinking plenty of water may help. Contact your doctor if the problem does not go away or is severe. This medicine may cause dry eyes and blurred vision. If you wear contact lenses you may feel some discomfort. Lubricating drops may help. See your eye doctor if the problem does not go away or is severe. This medicine can cause constipation. Try to have a bowel movement at least every 2 to 3 days. If you do not have a bowel movement for 3 days, call your doctor or health care professional. This medicine can make you more sensitive to the sun. Keep out of the sun. If you cannot avoid being in the sun, wear protective clothing and use sunscreen. Do not use sun lamps or tanning beds/booths. What side effects may I notice from receiving this medicine? Side effects that you should report to your doctor or health care professional as soon as possible: -allergic reactions like skin rash, itching or hives, swelling of the face, lips, or tongue -abnormal production of milk in females -breast enlargement in both males and females -breathing problems -confusion, hallucinations -fever with increased sweating -irregular or fast, pounding heartbeat -muscle  stiffness, or spasms -pain or difficulty passing urine, loss of bladder control -seizures -suicidal thoughts or other mood changes -swelling of the testicles -tingling, pain, or numbness in the feet or hands -yellowing of the eyes or skin Side effects that usually do not require medical attention (report to your doctor or health care professional if they continue or are bothersome): -change in sex drive or performance -diarrhea -nausea, vomiting -weight gain or loss This list may not describe all possible side effects. Call your doctor for medical advice about side effects. You may report side effects to FDA at 1-800-FDA-1088. Where should I keep my medicine? Keep out of the reach of children. Store at room temperature between 15 and 30 degrees C (59 and 86 degrees F). Keep container tightly closed. Throw away any unused medicine after the expiration date. NOTE: This sheet is a summary. It may not cover all possible information. If you have questions about this medicine, talk to your doctor, pharmacist, or health care provider.  2015, [employer]. (2011-12-31 13:57:12)

## 2015-02-22 NOTE — Progress Notes (Signed)
I have reviewed and agreed above plan. 

## 2016-07-16 ENCOUNTER — Emergency Department (HOSPITAL_COMMUNITY)
Admission: EM | Admit: 2016-07-16 | Discharge: 2016-07-16 | Disposition: A | Discharge status: Home / Self Care | Payer: No Typology Code available for payment source | Attending: Emergency Medicine | Admitting: Emergency Medicine

## 2016-07-16 ENCOUNTER — Encounter (HOSPITAL_COMMUNITY): Payer: Self-pay | Admitting: Vascular Surgery

## 2016-07-16 DIAGNOSIS — Y999 Unspecified external cause status: Secondary | ICD-10-CM | POA: Insufficient documentation

## 2016-07-16 DIAGNOSIS — Y92410 Unspecified street and highway as the place of occurrence of the external cause: Secondary | ICD-10-CM | POA: Insufficient documentation

## 2016-07-16 DIAGNOSIS — Z79899 Other long term (current) drug therapy: Secondary | ICD-10-CM | POA: Diagnosis not present

## 2016-07-16 DIAGNOSIS — Y9389 Activity, other specified: Secondary | ICD-10-CM | POA: Diagnosis not present

## 2016-07-16 DIAGNOSIS — R51 Headache: Secondary | ICD-10-CM | POA: Diagnosis not present

## 2016-07-16 DIAGNOSIS — S199XXA Unspecified injury of neck, initial encounter: Secondary | ICD-10-CM | POA: Insufficient documentation

## 2016-07-16 DIAGNOSIS — S3992XA Unspecified injury of lower back, initial encounter: Secondary | ICD-10-CM | POA: Insufficient documentation

## 2016-07-16 MED ORDER — METHOCARBAMOL 500 MG PO TABS: 500.0000 mg | ORAL_TABLET | Freq: Two times a day (BID) | ORAL | 0 refills | Status: DC

## 2016-07-16 MED ORDER — NAPROXEN 500 MG PO TABS: 500.0000 mg | ORAL_TABLET | Freq: Two times a day (BID) | ORAL | 0 refills | Status: DC
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
## 2016-07-16 NOTE — Discharge Instructions (Signed)
Take your medications as prescribed as needed for pain relief. I also recommend applying ice and/or heat to affected area for 15-20 minutes 3-4 times daily for additional pain relief. Follow-up with your primary care provider in the next week if your symptoms have not improved. Please return to the Emergency Department if symptoms worsen or new onset of fever, numbness, tingling, saddle anesthesia, loss of bowel or bladder, weakness, abdominal pain, chest pain, difficulty breathing.

## 2016-07-16 NOTE — ED Notes (Signed)
D/C papers reviewed and patient verbalizes understanding

## 2017-08-29 ENCOUNTER — Encounter (HOSPITAL_COMMUNITY): Payer: Self-pay | Admitting: Emergency Medicine

## 2017-08-29 ENCOUNTER — Emergency Department (HOSPITAL_COMMUNITY)
Admission: EM | Admit: 2017-08-29 | Discharge: 2017-08-30 | Disposition: A | Discharge status: Home / Self Care | Payer: BLUE CROSS/BLUE SHIELD | Attending: Emergency Medicine | Admitting: Emergency Medicine

## 2017-08-29 DIAGNOSIS — Z79899 Other long term (current) drug therapy: Secondary | ICD-10-CM | POA: Insufficient documentation

## 2017-08-29 DIAGNOSIS — R51 Headache: Secondary | ICD-10-CM | POA: Insufficient documentation

## 2017-08-29 DIAGNOSIS — R519 Headache, unspecified: Secondary | ICD-10-CM

## 2017-08-29 DIAGNOSIS — H5712 Ocular pain, left eye: Secondary | ICD-10-CM | POA: Diagnosis present

## 2017-08-29 MED ORDER — DEXAMETHASONE SODIUM PHOSPHATE 10 MG/ML IJ SOLN
10.0000 mg | Freq: Once | INTRAMUSCULAR | Status: AC
  Administered 2017-08-29: 10 mg via INTRAVENOUS
  Filled 2017-08-29: qty 1

## 2017-08-29 MED ORDER — FLUORESCEIN SODIUM 1 MG OP STRP
1.0000 | ORAL_STRIP | Freq: Once | OPHTHALMIC | Status: AC
  Administered 2017-08-29: 1 via OPHTHALMIC
  Filled 2017-08-29: qty 1

## 2017-08-29 MED ORDER — TETRACAINE HCL 0.5 % OP SOLN
2.0000 [drp] | Freq: Once | OPHTHALMIC | Status: AC
  Administered 2017-08-29: 2 [drp] via OPHTHALMIC
  Filled 2017-08-29: qty 4

## 2017-08-29 MED ORDER — DIPHENHYDRAMINE HCL 50 MG/ML IJ SOLN
25.0000 mg | Freq: Once | INTRAMUSCULAR | Status: AC
  Administered 2017-08-29: 25 mg via INTRAVENOUS
  Filled 2017-08-29: qty 1

## 2017-08-29 MED ORDER — HYDROMORPHONE HCL 1 MG/ML IJ SOLN
1.0000 mg | Freq: Once | INTRAMUSCULAR | Status: AC
  Administered 2017-08-29: 1 mg via INTRAVENOUS
  Filled 2017-08-29: qty 1

## 2017-08-29 MED ORDER — IBUPROFEN 400 MG PO TABS
400.0000 mg | ORAL_TABLET | Freq: Once | ORAL | Status: AC
  Administered 2017-08-29: 400 mg via ORAL
  Filled 2017-08-29: qty 1

## 2017-08-29 MED ORDER — SODIUM CHLORIDE 0.9 % IV BOLUS (SEPSIS)
1000.0000 mL | Freq: Once | INTRAVENOUS | Status: AC
  Administered 2017-08-29: 1000 mL via INTRAVENOUS

## 2017-08-29 MED ORDER — KETOROLAC TROMETHAMINE 30 MG/ML IJ SOLN
30.0000 mg | Freq: Once | INTRAMUSCULAR | Status: AC
  Administered 2017-08-29: 30 mg via INTRAVENOUS
  Filled 2017-08-29: qty 1

## 2017-08-29 MED ORDER — TETRACAINE HCL 0.5 % OP SOLN
2.0000 [drp] | Freq: Once | OPHTHALMIC | Status: AC
  Administered 2017-08-29: 2 [drp] via OPHTHALMIC

## 2017-08-29 MED ORDER — PROCHLORPERAZINE EDISYLATE 5 MG/ML IJ SOLN
10.0000 mg | Freq: Once | INTRAMUSCULAR | Status: AC
  Administered 2017-08-29: 10 mg via INTRAVENOUS
  Filled 2017-08-29: qty 2

## 2017-08-29 NOTE — ED Provider Notes (Signed)
[place] EMERGENCY DEPARTMENT Provider Note   CSN: [id number] Arrival date & time: 08/29/17  1258     History   Chief Complaint Chief Complaint  Patient presents with  . Headache  . Eye Pain    HPI [name] is a 52 y.o. female with pmh migraine headaches and rheumatoid arthritis who presents with complaint of severe left eye pain and pain across her scalp.  This has been ongoing for the past 2 days.  She has associated photophobia, tearing and severe sharp pain across the entire left scalp.  It does not involve the right side.  She denies a history of problems with her eye.  She denies any contact lens wearing.  She states that this is nothing like her migraine headaches.  HPI  Past Medical History:  Diagnosis Date  . Arthritis   . Disturbance of skin sensation   . Headache   . Migraine without aura, without mention of intractable migraine without mention of status migrainosus 04/22/2014  . Rheumatoid arthritis [facility])     Patient Active Problem List   Diagnosis Date Noted  . Migraine without aura, without mention of intractable migraine without mention of status migrainosus 04/22/2014    History reviewed. No pertinent surgical history.  OB History    No data available       Home Medications    Prior to Admission medications   Medication Sig Start Date End Date Taking? Authorizing Provider  aspirin-acetaminophen-caffeine (EXCEDRIN MIGRAINE) [id number] MG tablet Take 2 tablets by mouth every 6 (six) hours as needed for headache.   Yes [provider]  doxycycline (VIBRAMYCIN) 100 MG capsule Take 100 mg by mouth 2 (two) times daily.   Yes [provider]  predniSONE (DELTASONE) 5 MG tablet Take by mouth See admin instructions. Prednisone 5mg  dose pack #48 pills 6 tablets for 4 days, 4 tablets for 4 days, 2 tablets for 4 days.   Yes [provider]  SUMAtriptan (IMITREX) 50 MG tablet Take 50 mg by mouth daily as  needed for headache. 1 tablet at onset then repeat in 2 hours No more than 2 tablets in 24 hours 07/29/17  Yes [provider]  tetrahydrozoline 0.05 % ophthalmic solution Place 1 drop into both eyes daily as needed (redness).   Yes [provider]  topiramate (TOPAMAX) 50 MG tablet Take 50 mg by mouth at bedtime. 07/28/17  Yes [provider]  atenolol (TENORMIN) 25 MG tablet Take 25 mg by mouth daily. 03/26/14 03/26/15  [provider]  eletriptan (RELPAX) 40 MG tablet Take 1 tablet (40 mg total) by mouth 2 (two) times daily as needed. Patient not taking: Reported on 08/29/2017 04/22/14   [name], MD  methocarbamol (ROBAXIN) 500 MG tablet Take 1 tablet (500 mg total) by mouth 2 (two) times daily. Patient not taking: Reported on 08/29/2017 07/16/16   Nona Dell, [name]  naproxen (NAPROSYN) 500 MG tablet Take 1 tablet (500 mg total) by mouth 2 (two) times daily. Patient not taking: Reported on 08/29/2017 07/16/16   Nona Dell, [name]  nortriptyline (PAMELOR) 10 MG capsule Take 1 capsule (10 mg total) by mouth at bedtime. Patient not taking: Reported on 08/29/2017 02/14/15   Ward Givens, NP  terbinafine (LAMISIL) 250 MG tablet Take 1 tablet (250 mg total) by mouth daily. Patient not taking: Reported on 08/29/2017 04/20/14   Tyson Dense T, DPM  valACYclovir (VALTREX) 1000 MG tablet Take 2 tabs PO every 12  hours, then stop. Patient not taking: Reported on 08/29/2017 09/19/12   Glenford Bayley, DO    Family History Family History  Problem Relation Age of Onset  . Migraines Mother   . Migraines Sister   . Dementia Brother   . Migraines Brother   . Migraines Brother   . Cancer - Lung Brother     Social History Social History   Tobacco Use  . Smoking status: Never Smoker  . Smokeless tobacco: Never Used  Substance Use Topics  . Alcohol use: No  . Drug use: No     Allergies   Patient has no known allergies.   Review of  Systems Review of Systems   Physical Exam Updated Vital Signs BP (!) 132/53 (BP Location: Right Arm)   Pulse 63   Temp 98.6 F (37 C) (Oral)   Resp 18   SpO2 99%   Physical Exam  Constitutional: She is oriented to person, place, and time. She appears well-developed and well-nourished. No distress.  HENT:  Head: Normocephalic and atraumatic.  Mouth/Throat: Oropharynx is clear and moist.  Eyes: EOM and lids are normal. Pupils are equal, round, and reactive to light. Right eye exhibits no chemosis. Left eye exhibits no chemosis. Right conjunctiva is not injected. Left conjunctiva is injected. No scleral icterus. Right eye exhibits normal extraocular motion. Left eye exhibits normal extraocular motion.  Slit lamp exam:      The right eye shows no corneal abrasion, no corneal flare, no foreign body and no fluorescein uptake.       The left eye shows no corneal abrasion, no corneal flare, no foreign body and no fluorescein uptake.  No horizontal, vertical or rotational nystagmus Pressure: OD: 14 OS 16  No dendritic lesions  No ciliary flush No evidence of Abrasion No pain with direct or indirect light exam  Neck: Normal range of motion. Neck supple.  Full active and passive ROM without pain No midline or paraspinal tenderness No nuchal rigidity or meningeal signs  Cardiovascular: Normal rate, regular rhythm and intact distal pulses.  Pulmonary/Chest: Effort normal and breath sounds normal. No respiratory distress. She has no wheezes. She has no rales.  Abdominal: Soft. Bowel sounds are normal. There is no tenderness. There is no rebound and no guarding.  Musculoskeletal: Normal range of motion.  Lymphadenopathy:    She has no cervical adenopathy.  Neurological: She is alert and oriented to person, place, and time. No cranial nerve deficit. She exhibits normal muscle tone. Coordination normal. GCS eye subscore is 4. GCS verbal subscore is 5. GCS motor subscore is 6.  Mental Status:   Alert, oriented, thought content appropriate. Speech fluent without evidence of aphasia. Able to follow 2 step commands without difficulty.  Cranial Nerves:  II:  Peripheral visual fields grossly normal, pupils equal, round, reactive to light III,IV, VI: ptosis not present, extra-ocular motions intact bilaterally  V,VII: smile symmetric, facial light touch sensation equal VIII: hearing grossly normal bilaterally  IX,X: midline uvula rise  XI: bilateral shoulder shrug equal and strong XII: midline tongue extension  Motor:  5/5 in upper and lower extremities bilaterally including strong and equal grip strength and dorsiflexion/plantar flexion Sensory: Pinprick and light touch normal in all extremities.  Cerebellar: normal finger-to-nose with bilateral upper extremities Gait: normal gait and balance CV: distal pulses palpable throughout   Skin: Skin is warm and dry. No rash noted. She is not diaphoretic.  Psychiatric: She has a normal mood and affect. Her behavior is  normal. Judgment and thought content normal.  Nursing note and vitals reviewed.    ED Treatments / Results  Labs (all labs ordered are listed, but only abnormal results are displayed) Labs Reviewed - No data to display  EKG  EKG Interpretation None       Radiology No results found.  Procedures Procedures (including critical care time)  Medications Ordered in ED Medications  ibuprofen (ADVIL,MOTRIN) tablet 400 mg (400 mg Oral Given 08/29/17 1402)     Initial Impression / Assessment and Plan / ED Course  I have reviewed the triage vital signs and the nursing notes.  Pertinent labs & imaging results that were available during my care of the patient were reviewed by me and considered in my medical decision making (see chart for details).  Clinical Course as of Aug 29 2329  Thu Aug 29, 2017  2330 Patient is now stating that her pain feels like her typical migraine headache.  Patient began vomiting is also  typical of her migraine.  [AH]    Clinical Course User Index [AH] [name], [name]   Patient's headache and nausea resolved.  Her eye continues to have some mild pain.  Although my examination does not reveal any abnormalities patient will be treated for corneal abrasion with erythromycin ointment as well as Valtrex for potential herpetic infection given the distribution of her pain.  Other differentials include uveitis given her history of rheumatoid arthritis.  Pressures are normal in the eye and I doubt acute angle-closure glaucoma.  Patient is advised to follow-up with her neurologist as well as ophthalmology.  She appears appropriate for discharge at this time  Final Clinical Impressions(s) / ED Diagnoses   Final diagnoses:  None    ED Discharge Orders    None       [name], [name] 08/30/17 0101    Malvin Johns, MD 08/31/17 [phone]

## 2017-08-29 NOTE — ED Notes (Addendum)
Pt c/o L eye pain, states that it feels like pins and needles sticking her in her eye; pt states that it started yesterday; pt states that pain 9/10, states that it "hurts really, really bad." Pt states that pain is in left side of her head as well.  Pt states that she attempted to flush eye but it hurt too bad when water hit eye

## 2017-08-29 NOTE — ED Triage Notes (Signed)
Pt presents to ED for assessment of left eye pain with associated headache.  No redness noted.  Hx of migraines.  No neuro deficits noted in triage, PERRL.  Pt c/o blurred vision in the left eye.

## 2017-08-30 MED ORDER — VALACYCLOVIR HCL 1 G PO TABS: 1000.0000 mg | ORAL_TABLET | Freq: Three times a day (TID) | ORAL | 0 refills | Status: DC

## 2017-08-30 MED ORDER — ERYTHROMYCIN 5 MG/GM OP OINT: TOPICAL_OINTMENT | OPHTHALMIC | 0 refills | Status: DC

## 2017-08-30 NOTE — ED Notes (Signed)
Pt given crackers and ginger ale; pt tolerated well

## 2017-08-30 NOTE — ED Notes (Signed)
Pt discharged from ED; instructions provided and scripts given; Pt encouraged to return to ED if symptoms worsen and to f/u with PCP; Pt verbalized understanding of all instructions 

## 2017-08-30 NOTE — Discharge Instructions (Signed)

## 2017-09-17 ENCOUNTER — Encounter: Payer: Self-pay | Admitting: Neurology

## 2017-09-17 ENCOUNTER — Ambulatory Visit: Payer: BLUE CROSS/BLUE SHIELD | Admitting: Neurology

## 2017-09-17 ENCOUNTER — Other Ambulatory Visit: Payer: Self-pay

## 2017-09-17 VITALS — BP 105/72 | HR 85 | Ht 60.0 in | Wt 104.0 lb

## 2017-09-17 DIAGNOSIS — G43019 Migraine without aura, intractable, without status migrainosus: Secondary | ICD-10-CM | POA: Diagnosis not present

## 2017-09-17 MED ORDER — PREDNISONE 5 MG PO TABS: ORAL_TABLET | ORAL | 0 refills | Status: DC

## 2017-09-17 MED ORDER — AMITRIPTYLINE HCL 25 MG PO TABS: 25.0000 mg | ORAL_TABLET | Freq: Every day | ORAL | 4 refills | Status: DC

## 2017-09-17 MED ORDER — SUMATRIPTAN SUCCINATE 100 MG PO TABS: 100.0000 mg | ORAL_TABLET | Freq: Two times a day (BID) | ORAL | 5 refills | Status: DC | PRN

## 2017-09-17 NOTE — Patient Instructions (Signed)
   We will increase the amitriptyline to 20 mg at night for one week, then start the 25 mg tablets, one at night.  We will start a prednisone taper for the next 6 days.  Call for any dose adjustments of the medications.

## 2017-09-17 NOTE — Progress Notes (Signed)
Reason for visit: Migraine headache  Referring physician: Dr. Velora Mediate Kathleen Costa is a 52 y.o. female  History of present illness:  Kathleen Costa is a 52 year old right-handed Asian female with a history of migraine headaches.  The patient was seen 3.5 years ago for intractable headaches.  The patient at that time indicated that she could not tolerate Topamax, she was switched to Zonegran but eventually was taken off this medication as well.  She was switched to amitriptyline in low-dose taking 10 mg at night.  She continues on this medication, she was having about 2 headaches a week in 2016, she returns at this point indicating that her headaches have converted to daily in nature over the last month or so.  The headaches are all over the head at times, sometimes just on the left side.  The patient may have some numb sensations in the neck.  She does have photophobia and phonophobia, she may have nausea and vomiting at times.  At times she has a unusual noises in her ears.  She has blurring of vision that may be significant.  She denies any numbness or weakness of the extremities.  She does have trouble with concentration and cognitive processing during the headache.  In the past she has had MRI evaluation of the brain that was essentially normal.  She returns to this office for an evaluation.  She is taking Imitrex for the headache with some benefit.  Past Medical History:  Diagnosis Date  . Arthritis   . Disturbance of skin sensation   . Headache   . Migraine without aura, without mention of intractable migraine without mention of status migrainosus 04/22/2014  . Rheumatoid arthritis ([place])     History reviewed. No pertinent surgical history.  Family History  Problem Relation Age of Onset  . Migraines Mother   . Migraines Sister   . Dementia Brother   . Migraines Brother   . Migraines Brother   . Cancer - Lung Brother     Social history:  reports that  has never smoked. she has  never used smokeless tobacco. She reports that she does not drink alcohol or use drugs.  Medications:  Prior to Admission medications   Medication Sig Start Date End Date Taking? Authorizing Provider  aspirin-acetaminophen-caffeine (EXCEDRIN MIGRAINE) [id number] MG tablet Take 2 tablets by mouth every 6 (six) hours as needed for headache.   Yes [provider]  doxycycline (VIBRAMYCIN) 100 MG capsule Take 100 mg by mouth 2 (two) times daily.   Yes [provider]  eletriptan (RELPAX) 40 MG tablet Take 1 tablet (40 mg total) by mouth 2 (two) times daily as needed. 04/22/14  Yes [name], MD  erythromycin ophthalmic ointment Place a 1/2 inch ribbon of ointment into the lower eyelid of the left eye for 5 days 2 x a day. 08/30/17  Yes Harris, Abigail, [name]  methocarbamol (ROBAXIN) 500 MG tablet Take 1 tablet (500 mg total) by mouth 2 (two) times daily. 07/16/16  Yes Nona Dell, [name]  naproxen (NAPROSYN) 500 MG tablet Take 1 tablet (500 mg total) by mouth 2 (two) times daily. 07/16/16  Yes Nona Dell, [name]  nortriptyline (PAMELOR) 10 MG capsule Take 1 capsule (10 mg total) by mouth at bedtime. 02/14/15  Yes Ward Givens, NP  predniSONE (DELTASONE) 5 MG tablet Take by mouth See admin instructions. Prednisone 5mg  dose pack #48 pills 6 tablets for 4 days, 4 tablets for 4 days, 2 tablets  for 4 days.   Yes [provider]  SUMAtriptan (IMITREX) 50 MG tablet Take 50 mg by mouth daily as needed for headache. 1 tablet at onset then repeat in 2 hours No more than 2 tablets in 24 hours 07/29/17  Yes [provider]  terbinafine (LAMISIL) 250 MG tablet Take 1 tablet (250 mg total) by mouth daily. 04/20/14  Yes Hyatt, Max T, DPM  tetrahydrozoline 0.05 % ophthalmic solution Place 1 drop into both eyes daily as needed (redness).   Yes [provider]  topiramate (TOPAMAX) 50 MG tablet Take 50 mg by mouth at bedtime. 07/28/17  Yes [provider]  valACYclovir (VALTREX) 1000 MG tablet Take 2 tabs PO every 12 hours, then stop. 09/19/12  Yes Le, [name], DO  valACYclovir (VALTREX) 1000 MG tablet Take 1 tablet (1,000 mg total) by mouth 3 (three) times daily. For 10 days 08/30/17  Yes Harris, Abigail, [name]  atenolol (TENORMIN) 25 MG tablet Take 25 mg by mouth daily. 03/26/14 03/26/15  [provider]     No Known Allergies  ROS:  Out of a complete 14 system review of symptoms, the patient complains only of the following symptoms, and all other reviewed systems are negative.  Joint pain Memory loss, headache  Blood pressure 105/72, pulse 85, height 5' (1.524 m), weight 104 lb (47.2 kg).  Physical Exam  General: The patient is alert and cooperative at the time of the examination.  Eyes: Pupils are equal, round, and reactive to light. Discs are flat bilaterally.  Neck: The neck is supple, no carotid bruits are noted.  Respiratory: The respiratory examination is clear.  Cardiovascular: The cardiovascular examination reveals a regular rate and rhythm, no obvious murmurs or rubs are noted.  Neuromuscular: Range of movement the neck is full.  The patient has some crepitus in the left temporomandibular joint.  Skin: Extremities are without significant edema.  Neurologic Exam  Mental status: The patient is alert and oriented x 3 at the time of the examination. The patient has apparent normal recent and remote memory, with an apparently normal attention span and concentration ability.  Cranial nerves: Facial symmetry is present. There is good sensation of the face to pinprick and soft touch bilaterally. The strength of the facial muscles and the muscles to head turning and shoulder shrug are normal bilaterally. Speech is well enunciated, no aphasia or dysarthria is noted. Extraocular movements are full. Visual fields are full. The tongue is midline, and the patient has symmetric elevation of the soft palate. No  obvious hearing deficits are noted.  Motor: The motor testing reveals 5 over 5 strength of all 4 extremities. Good symmetric motor tone is noted throughout.  Sensory: Sensory testing is intact to pinprick, soft touch, vibration sensation, and position sense on all 4 extremities. No evidence of extinction is noted.  Coordination: Cerebellar testing reveals good finger-nose-finger and heel-to-shin bilaterally.  Gait and station: Gait is normal. Tandem gait is normal. Romberg is negative. No drift is seen.  Reflexes: Deep tendon reflexes are symmetric and normal bilaterally. Toes are downgoing bilaterally.   Assessment/Plan:  1.  Migraine headache with converted migraine  The patient is now having daily headaches at this time.  We will put the patient on a prednisone Dosepak over the next 6 days, she will be increased on the amitriptyline up to 25 mg over the next week.  She was given a prescription for the Imitrex tablets, the 100 mg tablets.  If headaches  persist, she is contact our office, we will go up on the Topamax to 75 mg at night.  Otherwise, she will follow-up in 3 months.  Jill Alexanders MD 09/17/2017 3:13 PM  [facility] [address] [place], [place] [address]  Phone [phone] Fax [phone]

## 2017-09-28 ENCOUNTER — Encounter (HOSPITAL_COMMUNITY): Payer: Self-pay

## 2017-09-28 ENCOUNTER — Emergency Department (HOSPITAL_COMMUNITY): Payer: BLUE CROSS/BLUE SHIELD

## 2017-09-28 ENCOUNTER — Inpatient Hospital Stay (HOSPITAL_COMMUNITY): Payer: BLUE CROSS/BLUE SHIELD

## 2017-09-28 ENCOUNTER — Inpatient Hospital Stay (HOSPITAL_COMMUNITY)
Admission: EM | Admit: 2017-09-28 | Discharge: 2017-10-03 | DRG: 208 | Disposition: A | Discharge status: Home / Self Care | Payer: BLUE CROSS/BLUE SHIELD | Attending: Internal Medicine | Admitting: Internal Medicine

## 2017-09-28 DIAGNOSIS — R1084 Generalized abdominal pain: Secondary | ICD-10-CM | POA: Diagnosis not present

## 2017-09-28 DIAGNOSIS — D72819 Decreased white blood cell count, unspecified: Secondary | ICD-10-CM

## 2017-09-28 DIAGNOSIS — R202 Paresthesia of skin: Secondary | ICD-10-CM | POA: Diagnosis present

## 2017-09-28 DIAGNOSIS — R109 Unspecified abdominal pain: Secondary | ICD-10-CM

## 2017-09-28 DIAGNOSIS — E876 Hypokalemia: Secondary | ICD-10-CM | POA: Diagnosis present

## 2017-09-28 DIAGNOSIS — G43019 Migraine without aura, intractable, without status migrainosus: Secondary | ICD-10-CM | POA: Diagnosis present

## 2017-09-28 DIAGNOSIS — R7401 Elevation of levels of liver transaminase levels: Secondary | ICD-10-CM

## 2017-09-28 DIAGNOSIS — R410 Disorientation, unspecified: Secondary | ICD-10-CM | POA: Diagnosis not present

## 2017-09-28 DIAGNOSIS — R740 Nonspecific elevation of levels of transaminase and lactic acid dehydrogenase [LDH]: Secondary | ICD-10-CM | POA: Diagnosis present

## 2017-09-28 DIAGNOSIS — M329 Systemic lupus erythematosus, unspecified: Secondary | ICD-10-CM | POA: Diagnosis present

## 2017-09-28 DIAGNOSIS — R590 Localized enlarged lymph nodes: Secondary | ICD-10-CM | POA: Diagnosis present

## 2017-09-28 DIAGNOSIS — G9341 Metabolic encephalopathy: Secondary | ICD-10-CM | POA: Diagnosis not present

## 2017-09-28 DIAGNOSIS — B349 Viral infection, unspecified: Secondary | ICD-10-CM | POA: Diagnosis present

## 2017-09-28 DIAGNOSIS — J9601 Acute respiratory failure with hypoxia: Principal | ICD-10-CM | POA: Diagnosis present

## 2017-09-28 DIAGNOSIS — T398X5A Adverse effect of other nonopioid analgesics and antipyretics, not elsewhere classified, initial encounter: Secondary | ICD-10-CM | POA: Diagnosis present

## 2017-09-28 DIAGNOSIS — M791 Myalgia, unspecified site: Secondary | ICD-10-CM | POA: Diagnosis not present

## 2017-09-28 DIAGNOSIS — I959 Hypotension, unspecified: Secondary | ICD-10-CM | POA: Diagnosis present

## 2017-09-28 DIAGNOSIS — R748 Abnormal levels of other serum enzymes: Secondary | ICD-10-CM | POA: Diagnosis present

## 2017-09-28 DIAGNOSIS — R7982 Elevated C-reactive protein (CRP): Secondary | ICD-10-CM | POA: Diagnosis present

## 2017-09-28 DIAGNOSIS — Z9289 Personal history of other medical treatment: Secondary | ICD-10-CM

## 2017-09-28 DIAGNOSIS — R4182 Altered mental status, unspecified: Secondary | ICD-10-CM | POA: Diagnosis not present

## 2017-09-28 DIAGNOSIS — D696 Thrombocytopenia, unspecified: Secondary | ICD-10-CM | POA: Diagnosis present

## 2017-09-28 DIAGNOSIS — R21 Rash and other nonspecific skin eruption: Secondary | ICD-10-CM | POA: Diagnosis present

## 2017-09-28 DIAGNOSIS — A419 Sepsis, unspecified organism: Secondary | ICD-10-CM | POA: Diagnosis present

## 2017-09-28 DIAGNOSIS — M7989 Other specified soft tissue disorders: Secondary | ICD-10-CM | POA: Diagnosis present

## 2017-09-28 DIAGNOSIS — D709 Neutropenia, unspecified: Secondary | ICD-10-CM

## 2017-09-28 DIAGNOSIS — Y92009 Unspecified place in unspecified non-institutional (private) residence as the place of occurrence of the external cause: Secondary | ICD-10-CM

## 2017-09-28 DIAGNOSIS — M0600 Rheumatoid arthritis without rheumatoid factor, unspecified site: Secondary | ICD-10-CM | POA: Diagnosis present

## 2017-09-28 DIAGNOSIS — R591 Generalized enlarged lymph nodes: Secondary | ICD-10-CM

## 2017-09-28 DIAGNOSIS — R0603 Acute respiratory distress: Secondary | ICD-10-CM

## 2017-09-28 DIAGNOSIS — E872 Acidosis: Secondary | ICD-10-CM | POA: Diagnosis present

## 2017-09-28 DIAGNOSIS — T43015A Adverse effect of tricyclic antidepressants, initial encounter: Secondary | ICD-10-CM | POA: Diagnosis present

## 2017-09-28 DIAGNOSIS — J9690 Respiratory failure, unspecified, unspecified whether with hypoxia or hypercapnia: Secondary | ICD-10-CM

## 2017-09-28 DIAGNOSIS — M130 Polyarthritis, unspecified: Secondary | ICD-10-CM | POA: Diagnosis present

## 2017-09-28 DIAGNOSIS — G92 Toxic encephalopathy: Secondary | ICD-10-CM | POA: Diagnosis present

## 2017-09-28 DIAGNOSIS — M069 Rheumatoid arthritis, unspecified: Secondary | ICD-10-CM

## 2017-09-28 DIAGNOSIS — T404X5A Adverse effect of other synthetic narcotics, initial encounter: Secondary | ICD-10-CM | POA: Diagnosis present

## 2017-09-28 DIAGNOSIS — Z01818 Encounter for other preprocedural examination: Secondary | ICD-10-CM

## 2017-09-28 DIAGNOSIS — K828 Other specified diseases of gallbladder: Secondary | ICD-10-CM

## 2017-09-28 LAB — CBC
HCT: 33.4 % — ABNORMAL LOW (ref 36.0–46.0)
Hemoglobin: 11.3 g/dL — ABNORMAL LOW (ref 12.0–15.0)
MCH: 30.8 pg (ref 26.0–34.0)
MCHC: 33.8 g/dL (ref 30.0–36.0)
MCV: 91 fL (ref 78.0–100.0)
Platelets: 147 10*3/uL — ABNORMAL LOW (ref 150–400)
RBC: 3.67 MIL/uL — ABNORMAL LOW (ref 3.87–5.11)
RDW: 15.3 % (ref 11.5–15.5)
WBC: 4.2 10*3/uL (ref 4.0–10.5)

## 2017-09-28 LAB — CREATININE, SERUM
Creatinine, Ser: 0.59 mg/dL (ref 0.44–1.00)
GFR calc Af Amer: >60 mL/min (ref >60)
GFR calc non Af Amer: >60 mL/min (ref >60)

## 2017-09-28 LAB — BLOOD GAS, ARTERIAL
Acid-base deficit: 6.6 mmol/L — ABNORMAL HIGH (ref 0.0–2.0)
Bicarbonate: 17.4 mmol/L — ABNORMAL LOW (ref 20.0–28.0)
Drawn by: 33147
FIO2: 100
MECHVT: 360 mL
O2 Saturation: 99.5 %
PEEP: 5 cmH2O
Patient temperature: 102.9
RATE: 24 resp/min
pCO2 arterial: 35 mmHg (ref 32.0–48.0)
pH, Arterial: 7.331 — ABNORMAL LOW (ref 7.350–7.450)
pO2, Arterial: 454 mmHg — ABNORMAL HIGH (ref 83.0–108.0)

## 2017-09-28 LAB — I-STAT CG4 LACTIC ACID, ED
Lactic Acid, Venous: 3.06 mmol/L (ref 0.5–1.9)
Lactic Acid, Venous: 2.77 mmol/L (ref 0.5–1.9)

## 2017-09-28 LAB — CSF CELL COUNT WITH DIFFERENTIAL
RBC Count, CSF: 3950 /mm3 — ABNORMAL HIGH
RBC Count, CSF: 740 /mm3 — ABNORMAL HIGH
Tube #: 1
Tube #: 4
WBC, CSF: 2 /mm3 (ref 0–5)
WBC, CSF: 2 /mm3 (ref 0–5)

## 2017-09-28 LAB — PROTEIN, CSF: Total  Protein, CSF: 31 mg/dL (ref 15–45)

## 2017-09-28 LAB — URINALYSIS, ROUTINE W REFLEX MICROSCOPIC
Bacteria, UA: NONE SEEN
Bilirubin Urine: NEGATIVE
Glucose, UA: NEGATIVE mg/dL
Hgb urine dipstick: NEGATIVE
Ketones, ur: NEGATIVE mg/dL
Nitrite: NEGATIVE
Protein, ur: NEGATIVE mg/dL
Specific Gravity, Urine: 1.005 (ref 1.005–1.030)
pH: 6 (ref 5.0–8.0)

## 2017-09-28 LAB — COMPREHENSIVE METABOLIC PANEL
ALT: 253 U/L — ABNORMAL HIGH (ref 14–54)
AST: 315 U/L — ABNORMAL HIGH (ref 15–41)
Albumin: 3.9 g/dL (ref 3.5–5.0)
Alkaline Phosphatase: 176 U/L — ABNORMAL HIGH (ref 38–126)
Anion gap: 9 (ref 5–15)
BUN: 13 mg/dL (ref 6–20)
CO2: 24 mmol/L (ref 22–32)
Calcium: 9.1 mg/dL (ref 8.9–10.3)
Chloride: 102 mmol/L (ref 101–111)
Creatinine, Ser: 0.76 mg/dL (ref 0.44–1.00)
GFR calc Af Amer: >60 mL/min (ref >60)
GFR calc non Af Amer: >60 mL/min (ref >60)
Glucose, Bld: 128 mg/dL — ABNORMAL HIGH (ref 65–99)
Potassium: 4 mmol/L (ref 3.5–5.1)
Sodium: 135 mmol/L (ref 135–145)
Total Bilirubin: 1.4 mg/dL — ABNORMAL HIGH (ref 0.3–1.2)
Total Protein: 7.7 g/dL (ref 6.5–8.1)

## 2017-09-28 LAB — CK
Total CK: 119 U/L (ref 38–234)
Total CK: 365 U/L — ABNORMAL HIGH (ref 38–234)

## 2017-09-28 LAB — INFLUENZA PANEL BY PCR (TYPE A & B)
Influenza A By PCR: NEGATIVE
Influenza B By PCR: NEGATIVE

## 2017-09-28 LAB — CBC WITH DIFFERENTIAL/PLATELET
Basophils Absolute: 0 10*3/uL (ref 0.0–0.1)
Basophils Relative: 0 %
Eosinophils Absolute: 0 10*3/uL (ref 0.0–0.7)
Eosinophils Relative: 0 %
HCT: 39.2 % (ref 36.0–46.0)
Hemoglobin: 13.4 g/dL (ref 12.0–15.0)
Lymphocytes Relative: 36 %
Lymphs Abs: 0.7 10*3/uL (ref 0.7–4.0)
MCH: 32.1 pg (ref 26.0–34.0)
MCHC: 34.2 g/dL (ref 30.0–36.0)
MCV: 93.8 fL (ref 78.0–100.0)
Monocytes Absolute: 0 10*3/uL — ABNORMAL LOW (ref 0.1–1.0)
Monocytes Relative: 2 %
Neutro Abs: 1.1 10*3/uL — ABNORMAL LOW (ref 1.7–7.7)
Neutrophils Relative %: 62 %
Platelets: 203 10*3/uL (ref 150–400)
RBC: 4.18 MIL/uL (ref 3.87–5.11)
RDW: 15.6 % — ABNORMAL HIGH (ref 11.5–15.5)
WBC: 1.8 10*3/uL — ABNORMAL LOW (ref 4.0–10.5)

## 2017-09-28 LAB — SALICYLATE LEVEL: Salicylate Lvl: <7 mg/dL (ref 2.8–30.0)

## 2017-09-28 LAB — GLUCOSE, CSF: Glucose, CSF: 75 mg/dL — ABNORMAL HIGH (ref 40–70)

## 2017-09-28 LAB — D-DIMER, QUANTITATIVE: D-Dimer, Quant: 5.18 ug/mL-FEU — ABNORMAL HIGH (ref 0.00–0.50)

## 2017-09-28 LAB — TSH: TSH: 1.419 u[IU]/mL (ref 0.350–4.500)

## 2017-09-28 LAB — MAGNESIUM: Magnesium: 1.8 mg/dL (ref 1.7–2.4)

## 2017-09-28 LAB — T4, FREE: Free T4: 0.99 ng/dL (ref 0.61–1.12)

## 2017-09-28 LAB — I-STAT TROPONIN, ED: Troponin i, poc: 0 ng/mL (ref 0.00–0.08)

## 2017-09-28 LAB — ETHANOL: Alcohol, Ethyl (B): <10 mg/dL (ref <10)

## 2017-09-28 LAB — MRSA PCR SCREENING: MRSA by PCR: NEGATIVE

## 2017-09-28 LAB — ACETAMINOPHEN LEVEL: Acetaminophen (Tylenol), Serum: <10 ug/mL — ABNORMAL LOW (ref 10–30)

## 2017-09-28 MED ORDER — MIDAZOLAM HCL 2 MG/2ML IJ SOLN: 2.0000 mg | INTRAMUSCULAR | Status: DC | PRN

## 2017-09-28 MED ORDER — SODIUM CHLORIDE 0.9 % IV SOLN
0.0000 ug/h | INTRAVENOUS | Status: DC
  Administered 2017-09-28: 50 ug/h via INTRAVENOUS
  Filled 2017-09-28: qty 50

## 2017-09-28 MED ORDER — MIDAZOLAM HCL 2 MG/2ML IJ SOLN
2.0000 mg | INTRAMUSCULAR | Status: AC | PRN
  Administered 2017-09-28 (×3): 2 mg via INTRAVENOUS
  Filled 2017-09-28 (×2): qty 2

## 2017-09-28 MED ORDER — ASPIRIN 81 MG PO CHEW: 324.0000 mg | CHEWABLE_TABLET | ORAL | Status: AC

## 2017-09-28 MED ORDER — DEXTROSE 5 % IV SOLN
1.0000 g | INTRAVENOUS | Status: DC
  Administered 2017-09-28 – 2017-09-29 (×2): 1 g via INTRAVENOUS
  Filled 2017-09-28 (×2): qty 10

## 2017-09-28 MED ORDER — MIDAZOLAM HCL 2 MG/2ML IJ SOLN
INTRAMUSCULAR | Status: AC
  Administered 2017-09-28: 2 mg via INTRAVENOUS
  Filled 2017-09-28: qty 2

## 2017-09-28 MED ORDER — SODIUM CHLORIDE 0.9 % IV BOLUS (SEPSIS)
1000.0000 mL | Freq: Once | INTRAVENOUS | Status: AC
  Administered 2017-09-28: 1000 mL via INTRAVENOUS

## 2017-09-28 MED ORDER — VANCOMYCIN HCL IN DEXTROSE 1-5 GM/200ML-% IV SOLN
1000.0000 mg | INTRAVENOUS | Status: DC
  Administered 2017-09-29: 1000 mg via INTRAVENOUS
  Filled 2017-09-28 (×2): qty 200

## 2017-09-28 MED ORDER — FENTANYL CITRATE (PF) 100 MCG/2ML IJ SOLN
75.0000 ug | Freq: Once | INTRAMUSCULAR | Status: AC
  Administered 2017-09-28: 75 ug via INTRAVENOUS
  Filled 2017-09-28: qty 2

## 2017-09-28 MED ORDER — ASPIRIN 300 MG RE SUPP
300.0000 mg | RECTAL | Status: AC
  Filled 2017-09-28: qty 1

## 2017-09-28 MED ORDER — LACTATED RINGERS IV SOLN
INTRAVENOUS | Status: DC
  Administered 2017-09-28 – 2017-09-29 (×2): via INTRAVENOUS

## 2017-09-28 MED ORDER — LACTATED RINGERS IV BOLUS (SEPSIS)
500.0000 mL | Freq: Once | INTRAVENOUS | Status: AC
  Administered 2017-09-28: 500 mL via INTRAVENOUS

## 2017-09-28 MED ORDER — ENOXAPARIN SODIUM 30 MG/0.3ML ~~LOC~~ SOLN
30.0000 mg | SUBCUTANEOUS | Status: DC
  Administered 2017-09-28 – 2017-10-01 (×3): 30 mg via SUBCUTANEOUS
  Filled 2017-09-28 (×3): qty 0.3

## 2017-09-28 MED ORDER — HYDROCORTISONE NA SUCCINATE PF 100 MG IJ SOLR
50.0000 mg | Freq: Four times a day (QID) | INTRAMUSCULAR | Status: DC
  Administered 2017-09-28 – 2017-09-30 (×7): 50 mg via INTRAVENOUS
  Filled 2017-09-28 (×7): qty 2

## 2017-09-28 MED ORDER — FENTANYL CITRATE (PF) 100 MCG/2ML IJ SOLN
25.0000 ug | Freq: Once | INTRAMUSCULAR | Status: AC
  Administered 2017-09-28: 25 ug via INTRAVENOUS
  Filled 2017-09-28: qty 2

## 2017-09-28 MED ORDER — PIPERACILLIN-TAZOBACTAM 3.375 G IVPB 30 MIN
3.3750 g | Freq: Once | INTRAVENOUS | Status: AC
  Administered 2017-09-28: 3.375 g via INTRAVENOUS
  Filled 2017-09-28: qty 50

## 2017-09-28 MED ORDER — ORAL CARE MOUTH RINSE
15.0000 mL | Freq: Four times a day (QID) | OROMUCOSAL | Status: DC
  Administered 2017-09-28 – 2017-09-29 (×4): 15 mL via OROMUCOSAL

## 2017-09-28 MED ORDER — SODIUM CHLORIDE 0.9 % IV SOLN: 250.0000 mL | INTRAVENOUS | Status: DC | PRN

## 2017-09-28 MED ORDER — ACETAMINOPHEN 160 MG/5ML PO SOLN
650.0000 mg | Freq: Four times a day (QID) | ORAL | Status: DC | PRN
Start: 2017-09-28 — End: 2017-09-30
  Administered 2017-09-28: 650 mg

## 2017-09-28 MED ORDER — PANTOPRAZOLE SODIUM 40 MG IV SOLR
40.0000 mg | Freq: Every day | INTRAVENOUS | Status: DC
  Administered 2017-09-28 – 2017-09-29 (×2): 40 mg via INTRAVENOUS
  Filled 2017-09-28 (×2): qty 40

## 2017-09-28 MED ORDER — IOPAMIDOL (ISOVUE-370) INJECTION 76%
INTRAVENOUS | Status: AC
  Filled 2017-09-28: qty 100

## 2017-09-28 MED ORDER — FENTANYL CITRATE (PF) 100 MCG/2ML IJ SOLN
50.0000 ug | Freq: Once | INTRAMUSCULAR | Status: AC
  Administered 2017-09-28: 50 ug via INTRAVENOUS
  Filled 2017-09-28: qty 2

## 2017-09-28 MED ORDER — IBUPROFEN 100 MG/5ML PO SUSP
600.0000 mg | Freq: Once | ORAL | Status: AC
  Administered 2017-09-28: 600 mg via ORAL
  Filled 2017-09-28: qty 30

## 2017-09-28 MED ORDER — VANCOMYCIN HCL IN DEXTROSE 1-5 GM/200ML-% IV SOLN
1000.0000 mg | Freq: Once | INTRAVENOUS | Status: AC
  Administered 2017-09-28: 1000 mg via INTRAVENOUS
  Filled 2017-09-28: qty 200

## 2017-09-28 MED ORDER — SODIUM CHLORIDE 0.9 % IV SOLN
6.0000 mg/h | INTRAVENOUS | Status: DC
  Administered 2017-09-28: 2 mg/h via INTRAVENOUS
  Administered 2017-09-28: 6 mg/h via INTRAVENOUS
  Filled 2017-09-28 (×3): qty 10

## 2017-09-28 MED ORDER — FENTANYL BOLUS VIA INFUSION
50.0000 ug | INTRAVENOUS | Status: DC | PRN
  Administered 2017-09-29: 50 ug via INTRAVENOUS
  Filled 2017-09-28: qty 50

## 2017-09-28 MED ORDER — IOPAMIDOL (ISOVUE-370) INJECTION 76%
100.0000 mL | Freq: Once | INTRAVENOUS | Status: AC | PRN
  Administered 2017-09-28: 100 mL via INTRAVENOUS

## 2017-09-28 MED ORDER — CHLORHEXIDINE GLUCONATE 0.12% ORAL RINSE (MEDLINE KIT)
15.0000 mL | Freq: Two times a day (BID) | OROMUCOSAL | Status: DC
  Administered 2017-09-28 – 2017-09-29 (×3): 15 mL via OROMUCOSAL

## 2017-09-28 MED ORDER — SODIUM CHLORIDE 0.9 % IV SOLN
25.0000 ug/h | INTRAVENOUS | Status: DC
  Administered 2017-09-28: 25 ug/h via INTRAVENOUS
  Filled 2017-09-28: qty 50

## 2017-09-28 NOTE — Progress Notes (Signed)
Placed pt on previous settings from [facility] long hospital of Vt 360, RR 24, fio2 40% and PEEP +5.

## 2017-09-28 NOTE — H&P (Signed)
PULMONARY / CRITICAL CARE MEDICINE   Name: Kathleen Costa MRN: [id number] DOB: 06-21-1966    ADMISSION DATE:  09/28/2017 CONSULTATION DATE: September 28, 2017  REFERRING MD: ED staff  CHIEF COMPLAINT: Altered mental status generalized weakness and sepsis  HISTORY OF PRESENT ILLNESS:   History was obtained by the family patient is intubated very agitated not able to give any history 52 years old with history of rheumatoid arthritis migraine was twisting to her 3 grandkids called her daughter around 2 in the morning after she took amitriptyline she started feeling very sick and became unresponsive was brought to the ED with severe shortness of breath agitation was intubated for airway protection patient had a history of sinusitis 2 weeks ago she was given Valtrex according to the daughter patient had a history of rheumatoid arthritis has been controlled family is denying that the patient has history of alcohol drug abuse.  There was no prodrome before the patient went to sleep.  She is migrated from [place] 30 years ago  Advance :  She  has a past medical history of Arthritis, Disturbance of skin sensation, Headache, Migraine without aura, without mention of intractable migraine without mention of status migrainosus (04/22/2014), and Rheumatoid arthritis ([place]).  PAST SURGICAL HISTORY: She  has no past surgical history on file.  No Known Allergies  No current facility-administered medications on file prior to encounter.    Current Outpatient Medications on File Prior to Encounter  Medication Sig  . amitriptyline (ELAVIL) 25 MG tablet Take 1 tablet (25 mg total) by mouth at bedtime.  Marland Kitchen aspirin-acetaminophen-caffeine (EXCEDRIN MIGRAINE) 250-250-65 MG tablet Take 2 tablets by mouth every 6 (six) hours as needed for headache.  . Chlorphen-PE-Acetaminophen (NOREL AD) 4-10-325 MG TABS Take 1 tablet by mouth daily as needed (headache).  . eletriptan (RELPAX) 40 MG tablet Take 1 tablet  (40 mg total) by mouth 2 (two) times daily as needed. (Patient taking differently: Take 40 mg by mouth 2 (two) times daily as needed for migraine. )  . erythromycin ophthalmic ointment Place a 1/2 inch ribbon of ointment into the lower eyelid of the left eye for 5 days 2 x a day.  . ibuprofen (ADVIL,MOTRIN) 200 MG tablet Take 400 mg by mouth every 6 (six) hours as needed for mild pain.  . predniSONE (STERAPRED UNI-PAK 21 TAB) 10 MG (21) TBPK tablet Take 20 mg by mouth daily.  . SUMAtriptan (IMITREX) 50 MG tablet Take 50 mg by mouth every 2 (two) hours as needed for migraine. May repeat in 2 hours if headache persists or recurs.  . methocarbamol (ROBAXIN) 500 MG tablet Take 1 tablet (500 mg total) by mouth 2 (two) times daily. (Patient not taking: Reported on 09/28/2017)  . naproxen (NAPROSYN) 500 MG tablet Take 1 tablet (500 mg total) by mouth 2 (two) times daily. (Patient not taking: Reported on 09/28/2017)  . predniSONE (DELTASONE) 5 MG tablet Begin taking 6 tablets daily, taper by one tablet daily until off the medication. (Patient not taking: Reported on 09/28/2017)  . SUMAtriptan (IMITREX) 100 MG tablet Take 1 tablet (100 mg total) by mouth 2 (two) times daily as needed for migraine. (Patient not taking: Reported on 09/28/2017)  . terbinafine (LAMISIL) 250 MG tablet Take 1 tablet (250 mg total) by mouth daily. (Patient not taking: Reported on 09/28/2017)  . valACYclovir (VALTREX) 1000 MG tablet Take 2 tabs PO every 12 hours, then stop. (Patient not taking: Reported on 09/28/2017)  . valACYclovir (VALTREX) 1000 MG  tablet Take 1 tablet (1,000 mg total) by mouth 3 (three) times daily. For 10 days (Patient not taking: Reported on 09/28/2017)    FAMILY HISTORY:  Her indicated that her mother is deceased. She indicated that her father is deceased. She indicated that her sister is alive. She indicated that three of her four brothers are alive.   SOCIAL HISTORY: She  reports that  has never smoked. she has never  used smokeless tobacco. She reports that she does not drink alcohol or use drugs.  REVIEW OF SYSTEMS:   Unable to obtain due to patient's conditions  SUBJECTIVE:  Unable to obtain due to patient's conditions  VITAL SIGNS: BP 106/69   Pulse (!) 148   Temp (!) 102.9 F (39.4 C) (Rectal)   Resp (!) 22   SpO2 100%   HEMODYNAMICS:    VENTILATOR SETTINGS: Vent Mode: PRVC FiO2 (%):  [100 %] 100 % Set Rate:  [24 bmp] 24 bmp Vt Set:  [360 mL] 360 mL PEEP:  [5 cmH20] 5 cmH20 Plateau Pressure:  [24 cmH20] 24 cmH20  INTAKE / OUTPUT: No intake/output data recorded.  PHYSICAL EXAMINATION: General: Very agitated intubated unresponsive in severe distress Neuro: Moving upper and lower extremities without limitation HEENT:  atraumatic , no jaundice , dry mucous membranes  Cardiovascular:  Irregular irregular , ESM 2/6 in the aortic area  Lungs:  CTA bilateral , no wheezing or crackles  Abdomen:  Soft lax +BS , no tenderness . Musculoskeletal:  WNL , normal pulses  Skin:  N generalized hyperemic rash blanchable   LABS:  BMET Recent Labs  Lab 09/28/17 0745  NA 135  K 4.0  CL 102  CO2 24  BUN 13  CREATININE 0.76  GLUCOSE 128*    Electrolytes Recent Labs  Lab 09/28/17 0745  CALCIUM 9.1  MG 1.8    CBC Recent Labs  Lab 09/28/17 0745  WBC 1.8*  HGB 13.4  HCT 39.2  PLT 203    Coag's No results for input(s): APTT, INR in the last 168 hours.  Sepsis Markers Recent Labs  Lab 09/28/17 0802 09/28/17 0946  LATICACIDVEN 3.06* 2.77*    ABG No results for input(s): PHART, PCO2ART, PO2ART in the last 168 hours.  Liver Enzymes Recent Labs  Lab 09/28/17 0745  AST 315*  ALT 253*  ALKPHOS 176*  BILITOT 1.4*  ALBUMIN 3.9    Cardiac Enzymes No results for input(s): TROPONINI, PROBNP in the last 168 hours.  Glucose No results for input(s): GLUCAP in the last 168 hours.  Imaging Ct Angio Chest Pe W And/or Wo Contrast  Result Date: 09/28/2017 CLINICAL  DATA:  Sudden weakness. Elevated D-dimer. Severe headache. EXAM: CT ANGIOGRAPHY CHEST CT ABDOMEN AND PELVIS WITH CONTRAST TECHNIQUE: Multidetector CT imaging of the chest was performed using the standard protocol during bolus administration of intravenous contrast. Multiplanar CT image reconstructions and MIPs were obtained to evaluate the vascular anatomy. Multidetector CT imaging of the abdomen and pelvis was performed using the standard protocol during bolus administration of intravenous contrast. CONTRAST:  119mL ISOVUE-370 IOPAMIDOL (ISOVUE-370) INJECTION 76% COMPARISON:  None. FINDINGS: CTA CHEST FINDINGS Cardiovascular: Satisfactory opacification of the pulmonary arteries to the segmental level. No evidence of pulmonary embolism. Normal heart size. No pericardial effusion. Mediastinum/Nodes: No enlarged mediastinal, or hilar lymph nodes. Thyroid gland, trachea, and esophagus demonstrate no significant findings. Bilateral prominent axillary lymph nodes. Lungs/Pleura: Lungs are clear. No pleural effusion or pneumothorax. Musculoskeletal: No chest wall abnormality. No acute or significant osseous  findings. Review of the MIP images confirms the above findings. CT ABDOMEN and PELVIS FINDINGS Hepatobiliary: Normal appearance of the liver. Focal hyperenhancement and wall thickening of the counter dependent inferior portion of the gallbladder. Pancreas: Unremarkable. No pancreatic ductal dilatation or surrounding inflammatory changes. Spleen: Normal in size without focal abnormality. Adrenals/Urinary Tract: Adrenal glands are unremarkable. Kidneys are normal, without renal calculi, focal lesion, or hydronephrosis. Bladder is unremarkable. Stomach/Bowel: Stomach is within normal limits. Appendix appears normal. No evidence of bowel wall thickening, distention, or inflammatory changes. Vascular/Lymphatic: No significant vascular findings are present. No enlarged abdominal or pelvic lymph nodes. Reproductive: Uterus and  bilateral adnexa are unremarkable. Other: No abdominal wall hernia or abnormality. No abdominopelvic ascites. Musculoskeletal: No acute or significant osseous findings. Review of the MIP images confirms the above findings. IMPRESSION: No evidence of pulmonary embolus. Moderate bilateral axillary lymphadenopathy, left greater than right. Further evaluation with focused bilateral axillary ultrasound may be considered to better characterize the morphology of the lymph nodes. Focal gallbladder wall thickening of the apex of the gallbladder. Right upper quadrant ultrasound may be considered for visualization of the gallbladder. Electronically Signed   By: Fidela Salisbury M.D.   On: 09/28/2017 10:35   Ct Abdomen Pelvis W Contrast  Result Date: 09/28/2017 CLINICAL DATA:  Sudden weakness. Elevated D-dimer. Severe headache. EXAM: CT ANGIOGRAPHY CHEST CT ABDOMEN AND PELVIS WITH CONTRAST TECHNIQUE: Multidetector CT imaging of the chest was performed using the standard protocol during bolus administration of intravenous contrast. Multiplanar CT image reconstructions and MIPs were obtained to evaluate the vascular anatomy. Multidetector CT imaging of the abdomen and pelvis was performed using the standard protocol during bolus administration of intravenous contrast. CONTRAST:  128mL ISOVUE-370 IOPAMIDOL (ISOVUE-370) INJECTION 76% COMPARISON:  None. FINDINGS: CTA CHEST FINDINGS Cardiovascular: Satisfactory opacification of the pulmonary arteries to the segmental level. No evidence of pulmonary embolism. Normal heart size. No pericardial effusion. Mediastinum/Nodes: No enlarged mediastinal, or hilar lymph nodes. Thyroid gland, trachea, and esophagus demonstrate no significant findings. Bilateral prominent axillary lymph nodes. Lungs/Pleura: Lungs are clear. No pleural effusion or pneumothorax. Musculoskeletal: No chest wall abnormality. No acute or significant osseous findings. Review of the MIP images confirms the above  findings. CT ABDOMEN and PELVIS FINDINGS Hepatobiliary: Normal appearance of the liver. Focal hyperenhancement and wall thickening of the counter dependent inferior portion of the gallbladder. Pancreas: Unremarkable. No pancreatic ductal dilatation or surrounding inflammatory changes. Spleen: Normal in size without focal abnormality. Adrenals/Urinary Tract: Adrenal glands are unremarkable. Kidneys are normal, without renal calculi, focal lesion, or hydronephrosis. Bladder is unremarkable. Stomach/Bowel: Stomach is within normal limits. Appendix appears normal. No evidence of bowel wall thickening, distention, or inflammatory changes. Vascular/Lymphatic: No significant vascular findings are present. No enlarged abdominal or pelvic lymph nodes. Reproductive: Uterus and bilateral adnexa are unremarkable. Other: No abdominal wall hernia or abnormality. No abdominopelvic ascites. Musculoskeletal: No acute or significant osseous findings. Review of the MIP images confirms the above findings. IMPRESSION: No evidence of pulmonary embolus. Moderate bilateral axillary lymphadenopathy, left greater than right. Further evaluation with focused bilateral axillary ultrasound may be considered to better characterize the morphology of the lymph nodes. Focal gallbladder wall thickening of the apex of the gallbladder. Right upper quadrant ultrasound may be considered for visualization of the gallbladder. Electronically Signed   By: Fidela Salisbury M.D.   On: 09/28/2017 10:35   Dg Chest Port 1 View  Result Date: 09/28/2017 CLINICAL DATA:  Post intubation. EXAM: PORTABLE CHEST 1 VIEW COMPARISON:  CT of the  chest 09/28/2017 FINDINGS: Post intubation. Endotracheal tube 2.6 cm above the carina. Enteric catheter with tip within the expected location of gastric body, side hole however slightly above the GE junction. Cardiomediastinal silhouette is normal. Mediastinal contours appear intact. There is no evidence of focal airspace  consolidation, pleural effusion or pneumothorax. Osseous structures are without acute abnormality. Soft tissues are grossly normal. IMPRESSION: Post intubation and appropriate positioning of the endotracheal tube. Enteric catheter side hole slightly above the expected location of the GE junction. Advancement may be considered. Electronically Signed   By: Fidela Salisbury M.D.   On: 09/28/2017 13:37   US Abdomen Limited Ruq  Result Date: 09/28/2017 CLINICAL DATA:  Right upper quadrant abdomen, chest and back pain since 3 a.m. today. Focal wall thickening and enhancement involving the apex of the gallbladder on an abdomen and pelvis CT earlier today. EXAM: ULTRASOUND ABDOMEN LIMITED RIGHT UPPER QUADRANT COMPARISON:  Abdomen and pelvis CT obtained earlier today. FINDINGS: Gallbladder: Diffuse gallbladder wall thickening with areas of comet tail shadowing. The maximum wall thickness is 3.5 mm. No gallstones or pericholecystic fluid. No sonographic Murphy sign. Common bile duct: Diameter: 2.6 mm Liver: No focal lesion identified. Within normal limits in parenchymal echogenicity. Portal vein is patent on color Doppler imaging with normal direction of blood flow towards the liver. IMPRESSION: Diffuse gallbladder wall thickening with, diffuse gallbladder wall thickening with areas of comet tail shadowing. This is compatible with adenomyomatosis. Otherwise, normal examination. Electronically Signed   By: Claudie Revering M.D.   On: 09/28/2017 13:16     STUDIES:  CT scan head pending CT scan abdomen pelvis chest with axillary lymphadenopathy gallbladder thickening no cholecystitis  CULTURES: Pending after the CT scan planning to do LP  ANTIBIOTICS: Broad-spectrum antibiotic target coverage for meningitis  SIGNIFICANT EVENTS: Intubated affect her sudden weakness  LINES/TUBES: Endotracheal tube for NG tube  DISCUSSION: 52 year old Asian female relatively previously healthy with rheumatoid arthritis  migraine had sudden onset of headache and sepsis today with altered mental status and ability to protect her airways for stat CT scan and possible LP  ASSESSMENT / PLAN:  PULMONARY A: Intubated because of respiratory insufficiency respiratory distress P:   VAP protocol no evidence of pneumonia or PE continue the vent support of the patient is responsive  CARDIOVASCULAR Hemodynamically was hypotensive responded to IV fluids  P:  We will hold off pressors for now  RENAL A:   IV hydration avoid nephrotoxic agents P:   Aggressive IV hydration especially with antibiotic use  GASTROINTESTINAL PPI coverage Transaminitis we will check hepatitis profile and Tylenol level  HEMATOLOGIC Leukopenia with  axillary lymphadenopathy HIV RPR and toxoplasmosis titers were sent patient does not have cats need to monitor her blood work this could be due to severe sepsis and viral illness INFECTIOUS Sepsis of unknown etiology CT scan ruled out with LP transferred to [facility] broad-spectrum antibiotic    NEUROLOGIC Picture is concerning for your related to viral illness will do LP after CAT scan she might need EMG or EEG if the picture does not improve we will send protein level for possibility of Guillain-Barr syndrome.  45 minutes.    Pulmonary and [place] Pager: [phone]  09/28/2017, 2:07 PM

## 2017-09-28 NOTE — Progress Notes (Signed)
Pharmacy Antibiotic Note  Kathleen Costa is a 52 y.o. female admitted on 09/28/2017 with sepsis.  Pharmacy has been consulted for Vancomycin dosing. Probable acalculous cholecystitis on CT. Intubated in ED  Patient has received Zosyn 3.375gm x1 at 11:00 Vancomycin 1gm ordered, not yet received  D dimer elevated: 5.18, ASA 324mg  ordered x1 Salicylate/ETOH/Acetaminophen levels ordered Hepatitis panel ordered  Plan: [facility] aware, further abx orders per [facility]     Temp (24hrs), Avg:100.1 F (37.8 C), Min:97.6 F (36.4 C), Max:102.9 F (39.4 C)  Recent Labs  Lab 09/28/17 0745 09/28/17 0802 09/28/17 0946  WBC 1.8*  --   --   CREATININE 0.76  --   --   LATICACIDVEN  --  3.06* 2.77*    Estimated Creatinine Clearance: 58.4 mL/min (by C-G formula based on SCr of 0.76 mg/dL).    No Known Allergies  Antimicrobials this admission: 2/2 Vancomcyin >>  2/2 Zosyn >>   Dose adjustments this admission:  Microbiology results: 2/2 BCx: sent       Flu panel PCR: needs collection       GC/Chlamydia probe: needs collection       Toxoplasma Ab IgG/IgM: needs collection   Thank you for allowing pharmacy to be a part of this patient's care.  Minda Ditto PharmD Pager [phone] 09/28/2017, 2:19 PM

## 2017-09-28 NOTE — ED Notes (Signed)
Pt. Placed on bedpan, when finished family removed bedpan and reported the contents had spilled onto the bed. I offered to change the linens on the bed and clean her but patient family refused saying "we can't move her she is in too much pain. We placed a towel underneath her so I think she is ok for now..."

## 2017-09-28 NOTE — Procedures (Signed)
Procedure lumbar puncture  Indication altered mental status with sepsis with headache generalized weakness   Consent is in the chart obtained by the daughter   reviewed labs CAT scan  Patient was put on her left side with help from the nurse at L4-L5 area identified anatomically needle was introduced into that space CSF return was red amber colored xanthochromia color with a not cloudy pressure was 19 total of 10 cc collected no complications to report patient tolerated the procedure well area was draped and prepped in a sterile fashion for the procedure.  Labs ordered.

## 2017-09-28 NOTE — ED Notes (Signed)
Bed: WA09 Expected date:  Expected time:  Means of arrival:  Comments: Move room 4 to 9

## 2017-09-28 NOTE — ED Notes (Signed)
OG tube advanced 2 cm per x-ray

## 2017-09-28 NOTE — ED Notes (Signed)
[facility] called  

## 2017-09-28 NOTE — Consult Note (Signed)
Neurology Consultation Reason for Consult: muscle pain, ams Referring Physician: Dr Milon Dikes   History is obtained from:Daughter and chart review  HPI: Kathleen Costa is a 52 y.o. female history of rheumatoid arthritis not on any immunomodulating medications, migraine headaches on amitriptyline presented with weakness, numbness and severe pain all over her body.  Daughter states at around midnight she got a call and patient states she was not feeling well, had numbness of both legs. Around 3 am daughter was asked to come over and patient complained of severe body pain and was taken to ER. Pain includes all over limbs on movement and also chest and abdominal pain. Vitals stable on arrival. CT abd and [place] Abd showed possible cholecystitis.  ED course  She then started to deteriorate after becoming febrile,tachycardic and tachypneic and hypotensive. Intubated for airway protection. Started on Vanc, Zosyn. Gen surgery consulted - no acute intervention, recommended HIDA scan and patient admitted to ICU. Marland Kitchen Patient underwent LP.     ROS:  Unable to obtain due to altered mental status.   Past Medical History:  Diagnosis Date  . Arthritis   . Disturbance of skin sensation   . Headache   . Migraine without aura, without mention of intractable migraine without mention of status migrainosus 04/22/2014  . Rheumatoid arthritis ([place])      Family History  Problem Relation Age of Onset  . Migraines Mother   . Migraines Sister   . Dementia Brother   . Migraines Brother   . Migraines Brother   . Cancer - Lung Brother     Social History:  reports that  has never smoked. she has never used smokeless tobacco. She reports that she does not drink alcohol or use drugs.   Exam: Current vital signs: BP (!) 100/58   Pulse (!) 135   Temp (!) 101.3 F (38.5 C)   Resp 19   Ht 5' (1.524 m)   Wt 53.6 kg (118 lb 2.7 oz)   SpO2 100%   BMI 23.08 kg/m  Vital signs in last 24 hours: Temp:  [97.6 F  (36.4 C)-102.9 F (39.4 C)] 101.3 F (38.5 C) (02/02 1745) Pulse Rate:  [77-150] 135 (02/02 1745) Resp:  [0-42] 19 (02/02 1745) BP: (87-145)/(53-86) 100/58 (02/02 1745) SpO2:  [89 %-100 %] 100 % (02/02 1745) FiO2 (%):  [40 %-100 %] 40 % (02/02 1645) Weight:  [53.6 kg (118 lb 2.7 oz)] 53.6 kg (118 lb 2.7 oz) (02/02 1722)   Physical Exam  Constitutional: Appears well-developed and well-nourished.  Psych: Affect appropriate to situation Eyes: No scleral injection HENT: No OP obstrucion Head: Normocephalic.  Cardiovascular: Normal rate and regular rhythm.  Respiratory: Effort normal, non-labored breathing GI: Soft.  No distension. There is no tenderness.  Skin: WDI  Neuro: intubated and on sedation with versed Mental Status: Patient does not respond to verbal stimuli.  Opens eyes to deep sternal rub.  Does not follow commands.   Cranial Nerves: II: patient does not respond confrontation bilaterally, pupils right bilaterally equal and reactive III,IV,VI: doll's present V,VII: corneal reflex: did not assess VIII: patient does not respond to verbal stimuli IX,X: gag reflex + XII: tongue strength unable to test Motor: Localizes and moves briskly to all 4 extremities Sensory:  respond to noxious stimuli in all extremities Deep Tendon Reflexes:  2+ biceps bilaterally, unable to elicit patellar and ankle reflexes Plantars: mute b/l Cerebellar: Unable to perform      I have reviewed labs in epic and the  results pertinent to this consultation are:   I have reviewed the images obtained:CT head    ASSESSMENT AND PLAN  12y F who presented with severe pain and numbness all over her body and arrived at 5 am to ER. Subsequently developed fever, tachycardia, tachypnea around 8-9 am,  initially was hypertensive but then hypotensive. Intubated around 12 pm and started on sedation. Receiving antibiotics for septic shock, patient also appears hyperemic. New medications include Relpax,  patient also on prednisone, amitripyline and ? Valtrex.   On examination, patient on sedation however withdrawing briskly to all four extremities, reflexes in lower extremities diminished possibly from sedation.    Impression:  Differential broad at this time and includes Septic Shock  Meningitis - LP results pending Serotonin syndrome from drug interactions ( amitriptyline, Relpax) - however not hypertensive, reflexes not brisk and normal CK Drug adverse reaction   Recommendation F/u LP results Rexam off sedation If mentation does not improve, will need MRI brain w/wo contrast F/u blood cx

## 2017-09-28 NOTE — ED Notes (Signed)
Family at bedside. 

## 2017-09-28 NOTE — Progress Notes (Signed)
Pt intubated in ED for WOB.

## 2017-09-28 NOTE — ED Triage Notes (Signed)
Pt's daughter states that the patient suddenly stated that she couldn't feel her arms or legs and everything was numb, pt is normally very mobile and independent Pt also reports a severe headache

## 2017-09-28 NOTE — Progress Notes (Signed)
A consult was received from an ED physician for Zosyn per pharmacy dosing.  The patient's profile has been reviewed for ht/wt/allergies/indication/available labs.   A one time order has been placed for Zosyn 3.375g.    Further antibiotics/pharmacy consults should be ordered by admitting physician if indicated.                       Thank you, Peggyann Juba, PharmD, BCPS 09/28/2017  10:49 AM

## 2017-09-28 NOTE — Consult Note (Signed)
Reason for Consult:  Possible acalculous cholecystitis Referring Physician: [place] - EDP  Kathleen Costa is an 52 y.o. female.  HPI: This is a 52 yo Asian female with a history of migraine headaches and rheumatoid arthritis who presented this morning with decreased function and sensation in her extremities, severe headache, and diffuse pain.  She was being evaluated by the EDP when she required intubation for increased work of breathing.  She had a CT scan of the chest/ abd/ pelvis that raised the question of some thickening of the gallbladder wall.  Kathleen Costa was then performed.  The patient has a very low WBC and slightly elevated LFT's.  She has some unexplained lymphadenopathy as well.  She is being evaluated by CCM and they are pursuing a neurologic work-up beginning with a head CT scan.  Past Medical History:  Diagnosis Date  . Arthritis   . Disturbance of skin sensation   . Headache   . Migraine without aura, without mention of intractable migraine without mention of status migrainosus 04/22/2014  . Rheumatoid arthritis ([place])     History reviewed. No pertinent surgical history.  Family History  Problem Relation Age of Onset  . Migraines Mother   . Migraines Sister   . Dementia Brother   . Migraines Brother   . Migraines Brother   . Cancer - Lung Brother     Social History:  reports that  has never smoked. she has never used smokeless tobacco. She reports that she does not drink alcohol or use drugs.  Allergies: No Known Allergies  Medications:  Prior to Admission medications   Medication Sig Start Date End Date Taking? Authorizing Provider  amitriptyline (ELAVIL) 25 MG tablet Take 1 tablet (25 mg total) by mouth at bedtime. 09/17/17  Yes [name], MD  aspirin-acetaminophen-caffeine (EXCEDRIN MIGRAINE) [phone] MG tablet Take 2 tablets by mouth every 6 (six) hours as needed for headache.   Yes [provider]  Chlorphen-PE-Acetaminophen (NOREL AD) 4-10-325 MG  TABS Take 1 tablet by mouth daily as needed (headache).   Yes [provider]  eletriptan (RELPAX) 40 MG tablet Take 1 tablet (40 mg total) by mouth 2 (two) times daily as needed. Patient taking differently: Take 40 mg by mouth 2 (two) times daily as needed for migraine.  04/22/14  Yes [name], MD  erythromycin ophthalmic ointment Place a 1/2 inch ribbon of ointment into the lower eyelid of the left eye for 5 days 2 x a day. 08/30/17  Yes Harris, Abigail, [name]  ibuprofen (ADVIL,MOTRIN) 200 MG tablet Take 400 mg by mouth every 6 (six) hours as needed for mild pain.   Yes [provider]  predniSONE (STERAPRED UNI-PAK 21 TAB) 10 MG (21) TBPK tablet Take 20 mg by mouth daily. 09/05/17  Yes [provider]  SUMAtriptan (IMITREX) 50 MG tablet Take 50 mg by mouth every 2 (two) hours as needed for migraine. May repeat in 2 hours if headache persists or recurs.   Yes [provider]  methocarbamol (ROBAXIN) 500 MG tablet Take 1 tablet (500 mg total) by mouth 2 (two) times daily. Patient not taking: Reported on 09/28/2017 07/16/16   Kathleen Dell, [name]  naproxen (NAPROSYN) 500 MG tablet Take 1 tablet (500 mg total) by mouth 2 (two) times daily. Patient not taking: Reported on 09/28/2017 07/16/16   Kathleen Dell, [name]  predniSONE (DELTASONE) 5 MG tablet Begin taking 6 tablets daily, taper by one tablet daily until off the medication. Patient  not taking: Reported on 09/28/2017 09/17/17   [name], MD  SUMAtriptan (IMITREX) 100 MG tablet Take 1 tablet (100 mg total) by mouth 2 (two) times daily as needed for migraine. Patient not taking: Reported on 09/28/2017 09/17/17   [name], MD  terbinafine (LAMISIL) 250 MG tablet Take 1 tablet (250 mg total) by mouth daily. Patient not taking: Reported on 09/28/2017 04/20/14   Garrel Ridgel, DPM  valACYclovir (VALTREX) 1000 MG tablet Take 2 tabs PO every 12 hours, then stop. Patient not taking: Reported  on 09/28/2017 09/19/12   [name], [name], DO  valACYclovir (VALTREX) 1000 MG tablet Take 1 tablet (1,000 mg total) by mouth 3 (three) times daily. For 10 days Patient not taking: Reported on 09/28/2017 08/30/17   Kathleen Mail, [name]     Results for orders placed or performed during the hospital encounter of 09/28/17 (from the past 48 hour(s))  CBC with Differential/Platelet     Status: Abnormal   Collection Time: 09/28/17  7:45 AM  Result Value Ref Range   WBC 1.8 (L) 4.0 - 10.5 K/uL   RBC 4.18 3.87 - 5.11 MIL/uL   Hemoglobin 13.4 12.0 - 15.0 g/dL   HCT 39.2 36.0 - 46.0 %   MCV 93.8 78.0 - 100.0 fL   MCH 32.1 26.0 - 34.0 pg   MCHC 34.2 30.0 - 36.0 g/dL   RDW 15.6 (H) 11.5 - 15.5 %   Platelets 203 150 - 400 K/uL   Neutrophils Relative % 62 %   Neutro Abs 1.1 (L) 1.7 - 7.7 K/uL   Lymphocytes Relative 36 %   Lymphs Abs 0.7 0.7 - 4.0 K/uL   Monocytes Relative 2 %   Monocytes Absolute 0.0 (L) 0.1 - 1.0 K/uL   Eosinophils Relative 0 %   Eosinophils Absolute 0.0 0.0 - 0.7 K/uL   Basophils Relative 0 %   Basophils Absolute 0.0 0.0 - 0.1 K/uL    Comment: Performed at [facility], [place] [address], [place], [place] [address]  Comprehensive metabolic panel     Status: Abnormal   Collection Time: 09/28/17  7:45 AM  Result Value Ref Range   Sodium 135 135 - 145 mmol/L   Potassium 4.0 3.5 - 5.1 mmol/L   Chloride 102 101 - 111 mmol/L   CO2 24 22 - 32 mmol/L   Glucose, Bld 128 (H) 65 - 99 mg/dL   BUN 13 6 - 20 mg/dL   Creatinine, Ser 0.76 0.44 - 1.00 mg/dL   Calcium 9.1 8.9 - 10.3 mg/dL   Total Protein 7.7 6.5 - 8.1 g/dL   Albumin 3.9 3.5 - 5.0 g/dL   AST 315 (H) 15 - 41 U/L   ALT 253 (H) 14 - 54 U/L   Alkaline Phosphatase 176 (H) 38 - 126 U/L   Total Bilirubin 1.4 (H) 0.3 - 1.2 mg/dL   GFR calc non Af Amer >60 >60 mL/min   GFR calc Af Amer >60 >60 mL/min    Comment: (NOTE) The eGFR has been calculated using the CKD EPI equation. This calculation has not been validated in  all clinical situations. eGFR's persistently <60 mL/min signify possible Chronic Kidney Disease.    Anion gap 9 5 - 15    Comment: Performed at [facility], [place] [address] [place], [place] [address]  Magnesium     Status: None   Collection Time: 09/28/17  7:45 AM  Result Value Ref Range   Magnesium 1.8  1.7 - 2.4 mg/dL    Comment: Performed at [facility], [place] [address] [place], [place] [address]  Urinalysis, Routine w reflex microscopic     Status: Abnormal   Collection Time: 09/28/17  7:45 AM  Result Value Ref Range   Color, Urine STRAW (A) YELLOW   APPearance CLEAR CLEAR   Specific Gravity, Urine 1.005 1.005 - 1.030   pH 6.0 5.0 - 8.0   Glucose, UA NEGATIVE NEGATIVE mg/dL   Hgb urine dipstick NEGATIVE NEGATIVE   Bilirubin Urine NEGATIVE NEGATIVE   Ketones, ur NEGATIVE NEGATIVE mg/dL   Protein, ur NEGATIVE NEGATIVE mg/dL   Nitrite NEGATIVE NEGATIVE   Leukocytes, UA SMALL (A) NEGATIVE   RBC / HPF 0-5 0 - 5 RBC/hpf   WBC, UA 0-5 0 - 5 WBC/hpf   Bacteria, UA NONE SEEN NONE SEEN   Squamous Epithelial / LPF 0-5 (A) NONE SEEN    Comment: Performed at [facility], [place] [address], [place], [place] [address]  D-dimer, quantitative (not at [facility])     Status: Abnormal   Collection Time: 09/28/17  7:45 AM  Result Value Ref Range   D-Dimer, Quant 5.18 (H) 0.00 - 0.50 ug/mL-FEU    Comment: (NOTE) At the manufacturer cut-off of 0.50 ug/mL FEU, this assay has been documented to exclude PE with a sensitivity and negative predictive value of 97 to 99%.  At this time, this assay has not been approved by the FDA to exclude DVT/VTE. Results should be correlated with clinical presentation. Performed at [facility], [place] [address], [place], [place] [address]   CK     Status: None   Collection Time: 09/28/17  7:45 AM  Result Value Ref Range   Total CK 119 38 - 234 U/L    Comment: Performed at [facility], [place] [address], [place], [place] [address]  I-Stat Troponin, ED (not at [facility])     Status: None   Collection Time: 09/28/17  8:00 AM  Result Value Ref Range   Troponin i, poc 0.00 0.00 - 0.08 ng/mL   Comment 3            Comment: Due to the release kinetics of cTnI, a negative result within the first hours of the onset of symptoms does not rule out myocardial infarction with certainty. If myocardial infarction is still suspected, repeat the test at appropriate intervals.   TSH     Status: None   Collection Time: 09/28/17  8:00 AM  Result Value Ref Range   TSH 1.419 0.350 - 4.500 uIU/mL    Comment: Performed by a 3rd Generation assay with a functional sensitivity of <=0.01 uIU/mL. Performed at [facility], [place] [address] [place], [place] [address]   I-Stat CG4 Lactic Acid, ED     Status: Abnormal   Collection Time: 09/28/17  8:02 AM  Result Value Ref Range   Lactic Acid, Venous 3.06 (HH) 0.5 - 1.9 mmol/L   Comment NOTIFIED PHYSICIAN   I-Stat CG4 Lactic Acid, ED     Status: Abnormal   Collection Time: 09/28/17  9:46 AM  Result Value Ref Range   Lactic Acid, Venous 2.77 (HH) 0.5 - 1.9 mmol/L   Comment NOTIFIED PHYSICIAN     Ct Angio Chest Pe W And/or Wo Contrast  Result Date: 09/28/2017 CLINICAL DATA:  Sudden weakness. Elevated D-dimer. Severe headache. EXAM: CT ANGIOGRAPHY CHEST CT ABDOMEN AND PELVIS WITH CONTRAST TECHNIQUE: Multidetector CT imaging  of the chest was performed using the standard protocol during bolus administration of intravenous contrast. Multiplanar CT image reconstructions and MIPs were obtained to evaluate the vascular anatomy. Multidetector CT imaging of the abdomen and pelvis was performed using the standard protocol during bolus administration of intravenous contrast. CONTRAST:  176m ISOVUE-370 IOPAMIDOL (ISOVUE-370) INJECTION 76% COMPARISON:  None. FINDINGS: CTA CHEST FINDINGS Cardiovascular: Satisfactory opacification of  the pulmonary arteries to the segmental level. No evidence of pulmonary embolism. Normal heart size. No pericardial effusion. Mediastinum/Nodes: No enlarged mediastinal, or hilar lymph nodes. Thyroid gland, trachea, and esophagus demonstrate no significant findings. Bilateral prominent axillary lymph nodes. Lungs/Pleura: Lungs are clear. No pleural effusion or pneumothorax. Musculoskeletal: No chest wall abnormality. No acute or significant osseous findings. Review of the MIP images confirms the above findings. CT ABDOMEN and PELVIS FINDINGS Hepatobiliary: Normal appearance of the liver. Focal hyperenhancement and wall thickening of the counter dependent inferior portion of the gallbladder. Pancreas: Unremarkable. No pancreatic ductal dilatation or surrounding inflammatory changes. Spleen: Normal in size without focal abnormality. Adrenals/Urinary Tract: Adrenal glands are unremarkable. Kidneys are normal, without renal calculi, focal lesion, or hydronephrosis. Bladder is unremarkable. Stomach/Bowel: Stomach is within normal limits. Appendix appears normal. No evidence of bowel wall thickening, distention, or inflammatory changes. Vascular/Lymphatic: No significant vascular findings are present. No enlarged abdominal or pelvic lymph nodes. Reproductive: Uterus and bilateral adnexa are unremarkable. Other: No abdominal wall hernia or abnormality. No abdominopelvic ascites. Musculoskeletal: No acute or significant osseous findings. Review of the MIP images confirms the above findings. IMPRESSION: No evidence of pulmonary embolus. Moderate bilateral axillary lymphadenopathy, left greater than right. Further evaluation with focused bilateral axillary ultrasound may be considered to better characterize the morphology of the lymph nodes. Focal gallbladder wall thickening of the apex of the gallbladder. Right upper quadrant ultrasound may be considered for visualization of the gallbladder. Electronically Signed   By:  DFidela SalisburyM.D.   On: 09/28/2017 10:35   Ct Abdomen Pelvis W Contrast  Result Date: 09/28/2017 CLINICAL DATA:  Sudden weakness. Elevated D-dimer. Severe headache. EXAM: CT ANGIOGRAPHY CHEST CT ABDOMEN AND PELVIS WITH CONTRAST TECHNIQUE: Multidetector CT imaging of the chest was performed using the standard protocol during bolus administration of intravenous contrast. Multiplanar CT image reconstructions and MIPs were obtained to evaluate the vascular anatomy. Multidetector CT imaging of the abdomen and pelvis was performed using the standard protocol during bolus administration of intravenous contrast. CONTRAST:  1080mISOVUE-370 IOPAMIDOL (ISOVUE-370) INJECTION 76% COMPARISON:  None. FINDINGS: CTA CHEST FINDINGS Cardiovascular: Satisfactory opacification of the pulmonary arteries to the segmental level. No evidence of pulmonary embolism. Normal heart size. No pericardial effusion. Mediastinum/Nodes: No enlarged mediastinal, or hilar lymph nodes. Thyroid gland, trachea, and esophagus demonstrate no significant findings. Bilateral prominent axillary lymph nodes. Lungs/Pleura: Lungs are clear. No pleural effusion or pneumothorax. Musculoskeletal: No chest wall abnormality. No acute or significant osseous findings. Review of the MIP images confirms the above findings. CT ABDOMEN and PELVIS FINDINGS Hepatobiliary: Normal appearance of the liver. Focal hyperenhancement and wall thickening of the counter dependent inferior portion of the gallbladder. Pancreas: Unremarkable. No pancreatic ductal dilatation or surrounding inflammatory changes. Spleen: Normal in size without focal abnormality. Adrenals/Urinary Tract: Adrenal glands are unremarkable. Kidneys are normal, without renal calculi, focal lesion, or hydronephrosis. Bladder is unremarkable. Stomach/Bowel: Stomach is within normal limits. Appendix appears normal. No evidence of bowel wall thickening, distention, or inflammatory changes. Vascular/Lymphatic:  No significant vascular findings are present. No enlarged abdominal or pelvic lymph nodes. Reproductive:  Uterus and bilateral adnexa are unremarkable. Other: No abdominal wall hernia or abnormality. No abdominopelvic ascites. Musculoskeletal: No acute or significant osseous findings. Review of the MIP images confirms the above findings. IMPRESSION: No evidence of pulmonary embolus. Moderate bilateral axillary lymphadenopathy, left greater than right. Further evaluation with focused bilateral axillary ultrasound may be considered to better characterize the morphology of the lymph nodes. Focal gallbladder wall thickening of the apex of the gallbladder. Right upper quadrant ultrasound may be considered for visualization of the gallbladder. Electronically Signed   By: Fidela Salisbury M.D.   On: 09/28/2017 10:35   Dg Chest Port 1 View  Result Date: 09/28/2017 CLINICAL DATA:  Post intubation. EXAM: PORTABLE CHEST 1 VIEW COMPARISON:  CT of the chest 09/28/2017 FINDINGS: Post intubation. Endotracheal tube 2.6 cm above the carina. Enteric catheter with tip within the expected location of gastric body, side hole however slightly above the GE junction. Cardiomediastinal silhouette is normal. Mediastinal contours appear intact. There is no evidence of focal airspace consolidation, pleural effusion or pneumothorax. Osseous structures are without acute abnormality. Soft tissues are grossly normal. IMPRESSION: Post intubation and appropriate positioning of the endotracheal tube. Enteric catheter side hole slightly above the expected location of the GE junction. Advancement may be considered. Electronically Signed   By: Fidela Salisbury M.D.   On: 09/28/2017 13:37   Kathleen Costa Abdomen Limited Ruq  Result Date: 09/28/2017 CLINICAL DATA:  Right upper quadrant abdomen, chest and back pain since 3 a.m. today. Focal wall thickening and enhancement involving the apex of the gallbladder on an abdomen and pelvis CT earlier today.  EXAM: ULTRASOUND ABDOMEN LIMITED RIGHT UPPER QUADRANT COMPARISON:  Abdomen and pelvis CT obtained earlier today. FINDINGS: Gallbladder: Diffuse gallbladder wall thickening with areas of comet tail shadowing. The maximum wall thickness is 3.5 mm. No gallstones or pericholecystic fluid. No sonographic Murphy sign. Common bile duct: Diameter: 2.6 mm Liver: No focal lesion identified. Within normal limits in parenchymal echogenicity. Portal vein is patent on color Doppler imaging with normal direction of blood flow towards the liver. IMPRESSION: Diffuse gallbladder wall thickening with, diffuse gallbladder wall thickening with areas of comet tail shadowing. This is compatible with adenomyomatosis. Otherwise, normal examination. Electronically Signed   By: Claudie Revering M.D.   On: 09/28/2017 13:16    Review of Systems  Unable to perform ROS: Intubated   Blood pressure (!) 142/73, pulse (!) 144, temperature 99.9 F (37.7 C), temperature source Rectal, resp. rate (!) 39, SpO2 90 %. Physical Exam WDWN, intubated, sedated; no reaction to stimuli Eyes:  Pupils equal, round; sclera anicteric HENT:  Oral mucosa moist; good dentition  Neck:  No masses palpated, no thyromegaly Lungs:  CTA bilaterally; normal respiratory effort CV:  Regular rate and rhythm; no murmurs; extremities well-perfused with no edema Abd:  Soft, no palpable masses; no apparent tenderness to palpation Skin:  Warm, dry; no sign of jaundice Psychiatric - unable to examine due to intubation/ sedation  Assessment/Plan: Unknown etiology of generalized weakness, pain No sign of cholelithiasis Diffuse GB wall thickening - HIDA scan would help rule out acute acalculous cholecystitis, but I would not rush to get this test today.  Continue neurologic/ sepsis work-up per CCM.  We will follow along.    Imogene Burn Tsuei 09/28/2017, 1:47 PM

## 2017-09-28 NOTE — ED Provider Notes (Signed)
[facility] DEPT Provider Note  CSN: [id number] Arrival date & time: 09/28/17 1505  Chief Complaint(s) Weakness  HPI Kathleen Costa is a 52 y.o. female with a history of rheumatoid arthritis not on any immunomodulating medications, migraine headaches on amitriptyline and triptan's who presents to the emergency department with progressively worsening generalized myalgia that began several hours ago.  Patient and family report that she intermittently gets migraine headaches with lower extremity paresthesias that tend to resolve within hours to a day.  This episode is the most severe episode that she has had.  She is endorsing severe upper and lower extremity pain, chest pain, abdominal pain.  Pain is exacerbated with movement and palpation of her entire body.  Alleviated by lying still.  No other alleviating or aggravating factors.  She is also endorsing typical migraine headache.  She denies any recent ingestions other than her prescribed medications which she takes as directed.  She endorses use of Tylenol but only takes 1-2 tablets a day.  She also takes 1-2 tablets of Excedrin a day.  She denies any trauma.  Endorses working out in the yard but does not handle soil.  Denies any use of organophosphates.  She denies any recent infections.  Denies any fevers, nausea, vomiting, diarrhea.  HPI  Past Medical History Past Medical History:  Diagnosis Date  . Arthritis   . Disturbance of skin sensation   . Headache   . Migraine without aura, without mention of intractable migraine without mention of status migrainosus 04/22/2014  . Rheumatoid arthritis [facility])    Patient Active Problem List   Diagnosis Date Noted  . Sepsis ([place]) 09/28/2017  . Leukopenia 09/28/2017  . Lymphadenopathy 09/28/2017  . Altered mental status 09/28/2017  . Abdominal pain   . Common migraine with intractable migraine 04/22/2014   Home Medication(s) Prior to Admission medications     Medication Sig Start Date End Date Taking? Authorizing Provider  amitriptyline (ELAVIL) 25 MG tablet Take 1 tablet (25 mg total) by mouth at bedtime. 09/17/17  Yes [name], MD  aspirin-acetaminophen-caffeine (EXCEDRIN MIGRAINE) [phone] MG tablet Take 2 tablets by mouth every 6 (six) hours as needed for headache.   Yes [provider]  Chlorphen-PE-Acetaminophen (NOREL AD) 4-10-325 MG TABS Take 1 tablet by mouth daily as needed (headache).   Yes [provider]  eletriptan (RELPAX) 40 MG tablet Take 1 tablet (40 mg total) by mouth 2 (two) times daily as needed. Patient taking differently: Take 40 mg by mouth 2 (two) times daily as needed for migraine.  04/22/14  Yes [name], MD  erythromycin ophthalmic ointment Place a 1/2 inch ribbon of ointment into the lower eyelid of the left eye for 5 days 2 x a day. 08/30/17  Yes Harris, Abigail, [name]  ibuprofen (ADVIL,MOTRIN) 200 MG tablet Take 400 mg by mouth every 6 (six) hours as needed for mild pain.   Yes [provider]  predniSONE (STERAPRED UNI-PAK 21 TAB) 10 MG (21) TBPK tablet Take 20 mg by mouth daily. 09/05/17  Yes [provider]  SUMAtriptan (IMITREX) 50 MG tablet Take 50 mg by mouth every 2 (two) hours as needed for migraine. May repeat in 2 hours if headache persists or recurs.   Yes [provider]  methocarbamol (ROBAXIN) 500 MG tablet Take 1 tablet (500 mg total) by mouth 2 (two) times daily. Patient not taking: Reported on 09/28/2017 07/16/16   Nona Dell, [name]  naproxen (NAPROSYN) 500 MG tablet  Take 1 tablet (500 mg total) by mouth 2 (two) times daily. Patient not taking: Reported on 09/28/2017 07/16/16   Nona Dell, [name]  predniSONE (DELTASONE) 5 MG tablet Begin taking 6 tablets daily, taper by one tablet daily until off the medication. Patient not taking: Reported on 09/28/2017 09/17/17   [name], MD  SUMAtriptan (IMITREX) 100 MG tablet Take 1  tablet (100 mg total) by mouth 2 (two) times daily as needed for migraine. Patient not taking: Reported on 09/28/2017 09/17/17   [name], MD  terbinafine (LAMISIL) 250 MG tablet Take 1 tablet (250 mg total) by mouth daily. Patient not taking: Reported on 09/28/2017 04/20/14   Garrel Ridgel, DPM  valACYclovir (VALTREX) 1000 MG tablet Take 2 tabs PO every 12 hours, then stop. Patient not taking: Reported on 09/28/2017 09/19/12   [name], [name], DO  valACYclovir (VALTREX) 1000 MG tablet Take 1 tablet (1,000 mg total) by mouth 3 (three) times daily. For 10 days Patient not taking: Reported on 09/28/2017 08/30/17   [name], [name]                                                                                                                                    Past Surgical History History reviewed. No pertinent surgical history. Family History Family History  Problem Relation Age of Onset  . Migraines Mother   . Migraines Sister   . Dementia Brother   . Migraines Brother   . Migraines Brother   . Cancer - Lung Brother     Social History Social History   Tobacco Use  . Smoking status: Never Smoker  . Smokeless tobacco: Never Used  Substance Use Topics  . Alcohol use: No  . Drug use: No   Allergies Patient has no known allergies.  Review of Systems Review of Systems All other systems are reviewed and are negative for acute change except as noted in the HPI  Physical Exam Vital Signs  I have reviewed the triage vital signs BP (!) 87/62  Pulse 77  Temp 99.9 F (37.7 C) (Rectal)   Resp (!) 22  SpO2 99%   Physical Exam  Constitutional: She is oriented to person, place, and time. She appears well-developed and well-nourished. No distress.  In obvious discomfort  HENT:  Head: Normocephalic and atraumatic.  Nose: Nose normal.  Eyes: Conjunctivae and EOM are normal. Pupils are equal, round, and reactive to light. Right eye exhibits no discharge. Left eye exhibits no discharge.  No scleral icterus.  Neck: Normal range of motion. Neck supple.  Cardiovascular: Normal rate and regular rhythm. Exam reveals no gallop and no friction rub.  No murmur heard. Pulmonary/Chest: Effort normal and breath sounds normal. No stridor. No respiratory distress. She has no rales.  Abdominal: Soft. She exhibits no distension. There is no tenderness.  Musculoskeletal: She exhibits no edema or tenderness.  Pain out of proportion,  and tenderness to palpation throughout her entire body.  Patient is able to move her extremities but it is painful.  No evidence of spasms.  Neurological: She is alert and oriented to person, place, and time.  Skin: Skin is warm and dry. No rash noted. She is not diaphoretic. No erythema.  Psychiatric: She has a normal mood and affect.  Vitals reviewed.   ED Results and Treatments Labs (all labs ordered are listed, but only abnormal results are displayed) Labs Reviewed  CBC WITH DIFFERENTIAL/PLATELET - Abnormal; Notable for the following components:      Result Value   WBC 1.8 (*)    RDW 15.6 (*)    Neutro Abs 1.1 (*)    Monocytes Absolute 0.0 (*)    All other components within normal limits  COMPREHENSIVE METABOLIC PANEL - Abnormal; Notable for the following components:   Glucose, Bld 128 (*)    AST 315 (*)    ALT 253 (*)    Alkaline Phosphatase 176 (*)    Total Bilirubin 1.4 (*)    All other components within normal limits  URINALYSIS, ROUTINE W REFLEX MICROSCOPIC - Abnormal; Notable for the following components:   Color, Urine STRAW (*)    Leukocytes, UA SMALL (*)    Squamous Epithelial / LPF 0-5 (*)    All other components within normal limits  D-DIMER, QUANTITATIVE (NOT AT [facility]) - Abnormal; Notable for the following components:   D-Dimer, Quant 5.18 (*)    All other components within normal limits  BLOOD GAS, ARTERIAL - Abnormal; Notable for the following components:   pH, Arterial 7.331 (*)    pO2, Arterial 454 (*)    Bicarbonate 17.4 (*)     Acid-base deficit 6.6 (*)    All other components within normal limits  ACETAMINOPHEN LEVEL - Abnormal; Notable for the following components:   Acetaminophen (Tylenol), Serum <10 (*)    All other components within normal limits  CBC - Abnormal; Notable for the following components:   RBC 3.67 (*)    Hemoglobin 11.3 (*)    HCT 33.4 (*)    Platelets 147 (*)    All other components within normal limits  I-STAT CG4 LACTIC ACID, ED - Abnormal; Notable for the following components:   Lactic Acid, Venous 3.06 (*)    All other components within normal limits  I-STAT CG4 LACTIC ACID, ED - Abnormal; Notable for the following components:   Lactic Acid, Venous 2.77 (*)    All other components within normal limits  CULTURE, BLOOD (ROUTINE X 2)  CULTURE, BLOOD (ROUTINE X 2)  MAGNESIUM  CK  TSH  ETHANOL  SALICYLATE LEVEL  CREATININE, SERUM  T4, FREE  INFLUENZA PANEL BY PCR (TYPE A & B)  TOXOPLASMA ANTIBODIES- IGG AND  IGM  RPR  HEPATITIS PANEL, ACUTE  HIV ANTIBODY (ROUTINE TESTING)  I-STAT TROPONIN, ED  GC/CHLAMYDIA PROBE AMP ([place]) NOT AT [facility]  EKG  EKG Interpretation  Date/Time:  Saturday September 28 2017 07:17:34 EST Ventricular Rate:  84 PR Interval:    QRS Duration: 87 QT Interval:  367 QTC Calculation: 434 R Axis:   34 Text Interpretation:  Sinus rhythm Borderline short PR interval NO STEMI No old tracing to compare Confirmed by Addison Lank [phone]) on 09/28/2017 9:08:21 AM      Radiology Ct Head Wo Contrast  Result Date: 09/28/2017 CLINICAL DATA:  ams  Had contrasted scan this am EXAM: CT HEAD WITHOUT CONTRAST TECHNIQUE: Contiguous axial images were obtained from the base of the skull through the vertex without intravenous contrast. COMPARISON:  None. FINDINGS: Brain: There is no intra or extra-axial fluid collection or mass lesion. The basilar  cisterns and ventricles have a normal appearance. There is no CT evidence for acute infarction or hemorrhage. There are bilateral basal ganglia calcifications. Vascular: No hyperdense vessel or unexpected calcification. Skull: Normal. Negative for fracture or focal lesion. Sinuses/Orbits: There is dense opacity throughout the paranasal sinuses. Mastoid air cells are normally aerated. Orbits are unremarkable. Other: None IMPRESSION: 1.  No evidence for acute intracranial abnormality. 2. Bilateral basal ganglia calcifications. 3. Paranasal sinus disease, likely chronic. Electronically Signed   By: Nolon Nations M.D.   On: 09/28/2017 15:59   Ct Angio Chest Pe W And/or Wo Contrast  Result Date: 09/28/2017 CLINICAL DATA:  Sudden weakness. Elevated D-dimer. Severe headache. EXAM: CT ANGIOGRAPHY CHEST CT ABDOMEN AND PELVIS WITH CONTRAST TECHNIQUE: Multidetector CT imaging of the chest was performed using the standard protocol during bolus administration of intravenous contrast. Multiplanar CT image reconstructions and MIPs were obtained to evaluate the vascular anatomy. Multidetector CT imaging of the abdomen and pelvis was performed using the standard protocol during bolus administration of intravenous contrast. CONTRAST:  124m ISOVUE-370 IOPAMIDOL (ISOVUE-370) INJECTION 76% COMPARISON:  None. FINDINGS: CTA CHEST FINDINGS Cardiovascular: Satisfactory opacification of the pulmonary arteries to the segmental level. No evidence of pulmonary embolism. Normal heart size. No pericardial effusion. Mediastinum/Nodes: No enlarged mediastinal, or hilar lymph nodes. Thyroid gland, trachea, and esophagus demonstrate no significant findings. Bilateral prominent axillary lymph nodes. Lungs/Pleura: Lungs are clear. No pleural effusion or pneumothorax. Musculoskeletal: No chest wall abnormality. No acute or significant osseous findings. Review of the MIP images confirms the above findings. CT ABDOMEN and PELVIS FINDINGS  Hepatobiliary: Normal appearance of the liver. Focal hyperenhancement and wall thickening of the counter dependent inferior portion of the gallbladder. Pancreas: Unremarkable. No pancreatic ductal dilatation or surrounding inflammatory changes. Spleen: Normal in size without focal abnormality. Adrenals/Urinary Tract: Adrenal glands are unremarkable. Kidneys are normal, without renal calculi, focal lesion, or hydronephrosis. Bladder is unremarkable. Stomach/Bowel: Stomach is within normal limits. Appendix appears normal. No evidence of bowel wall thickening, distention, or inflammatory changes. Vascular/Lymphatic: No significant vascular findings are present. No enlarged abdominal or pelvic lymph nodes. Reproductive: Uterus and bilateral adnexa are unremarkable. Other: No abdominal wall hernia or abnormality. No abdominopelvic ascites. Musculoskeletal: No acute or significant osseous findings. Review of the MIP images confirms the above findings. IMPRESSION: No evidence of pulmonary embolus. Moderate bilateral axillary lymphadenopathy, left greater than right. Further evaluation with focused bilateral axillary ultrasound may be considered to better characterize the morphology of the lymph nodes. Focal gallbladder wall thickening of the apex of the gallbladder. Right upper quadrant ultrasound may be considered for visualization of the gallbladder. Electronically Signed   By: DFidela SalisburyM.D.   On: 09/28/2017 10:35   Ct Abdomen Pelvis W Contrast  Result Date:  09/28/2017 CLINICAL DATA:  Sudden weakness. Elevated D-dimer. Severe headache. EXAM: CT ANGIOGRAPHY CHEST CT ABDOMEN AND PELVIS WITH CONTRAST TECHNIQUE: Multidetector CT imaging of the chest was performed using the standard protocol during bolus administration of intravenous contrast. Multiplanar CT image reconstructions and MIPs were obtained to evaluate the vascular anatomy. Multidetector CT imaging of the abdomen and pelvis was performed using the  standard protocol during bolus administration of intravenous contrast. CONTRAST:  186m ISOVUE-370 IOPAMIDOL (ISOVUE-370) INJECTION 76% COMPARISON:  None. FINDINGS: CTA CHEST FINDINGS Cardiovascular: Satisfactory opacification of the pulmonary arteries to the segmental level. No evidence of pulmonary embolism. Normal heart size. No pericardial effusion. Mediastinum/Nodes: No enlarged mediastinal, or hilar lymph nodes. Thyroid gland, trachea, and esophagus demonstrate no significant findings. Bilateral prominent axillary lymph nodes. Lungs/Pleura: Lungs are clear. No pleural effusion or pneumothorax. Musculoskeletal: No chest wall abnormality. No acute or significant osseous findings. Review of the MIP images confirms the above findings. CT ABDOMEN and PELVIS FINDINGS Hepatobiliary: Normal appearance of the liver. Focal hyperenhancement and wall thickening of the counter dependent inferior portion of the gallbladder. Pancreas: Unremarkable. No pancreatic ductal dilatation or surrounding inflammatory changes. Spleen: Normal in size without focal abnormality. Adrenals/Urinary Tract: Adrenal glands are unremarkable. Kidneys are normal, without renal calculi, focal lesion, or hydronephrosis. Bladder is unremarkable. Stomach/Bowel: Stomach is within normal limits. Appendix appears normal. No evidence of bowel wall thickening, distention, or inflammatory changes. Vascular/Lymphatic: No significant vascular findings are present. No enlarged abdominal or pelvic lymph nodes. Reproductive: Uterus and bilateral adnexa are unremarkable. Other: No abdominal wall hernia or abnormality. No abdominopelvic ascites. Musculoskeletal: No acute or significant osseous findings. Review of the MIP images confirms the above findings. IMPRESSION: No evidence of pulmonary embolus. Moderate bilateral axillary lymphadenopathy, left greater than right. Further evaluation with focused bilateral axillary ultrasound may be considered to better  characterize the morphology of the lymph nodes. Focal gallbladder wall thickening of the apex of the gallbladder. Right upper quadrant ultrasound may be considered for visualization of the gallbladder. Electronically Signed   By: DFidela SalisburyM.D.   On: 09/28/2017 10:35   Dg Chest Port 1 View  Result Date: 09/28/2017 CLINICAL DATA:  Post intubation. EXAM: PORTABLE CHEST 1 VIEW COMPARISON:  CT of the chest 09/28/2017 FINDINGS: Post intubation. Endotracheal tube 2.6 cm above the carina. Enteric catheter with tip within the expected location of gastric body, side hole however slightly above the GE junction. Cardiomediastinal silhouette is normal. Mediastinal contours appear intact. There is no evidence of focal airspace consolidation, pleural effusion or pneumothorax. Osseous structures are without acute abnormality. Soft tissues are grossly normal. IMPRESSION: Post intubation and appropriate positioning of the endotracheal tube. Enteric catheter side hole slightly above the expected location of the GE junction. Advancement may be considered. Electronically Signed   By: DFidela SalisburyM.D.   On: 09/28/2017 13:37   [place]dom[place] Limited Ruq  Result Date: 09/28/2017 CLINICAL DATA:  Right upper quadrant abdomen, chest and back pain since 3 a.m. today. Focal wall thickening and enhancement involving the apex of the gallbladder on an abdomen and pelvis CT earlier today. EXAM: ULTRASOUND ABDOMEN LIMITED RIGHT UPPER QUADRANT COMPARISON:  Abdomen and pelvis CT obtained earlier today. FINDINGS: Gallbladder: Diffuse gallbladder wall thickening with areas of comet tail shadowing. The maximum wall thickness is 3.5 mm. No gallstones or pericholecystic fluid. No sonographic Murphy sign. Common bile duct: Diameter: 2.6 mm Liver: No focal lesion identified. Within normal limits in parenchymal echogenicity. Portal vein is patent on color Doppler imaging  with normal direction of blood flow towards the liver. IMPRESSION:  Diffuse gallbladder wall thickening with, diffuse gallbladder wall thickening with areas of comet tail shadowing. This is compatible with adenomyomatosis. Otherwise, normal examination. Electronically Signed   By: Claudie Revering M.D.   On: 09/28/2017 13:16   Pertinent labs & imaging results that were available during my care of the patient were reviewed by me and considered in my medical decision making (see chart for details).  Medications Ordered in ED Medications  iopamidol (ISOVUE-370) 76 % injection (not administered)  fentaNYL (SUBLIMAZE) bolus via infusion 50 mcg (not administered)  midazolam (VERSED) injection 2 mg (not administered)  midazolam (VERSED) 50 mg in sodium chloride 0.9 % 50 mL (1 mg/mL) infusion (6 mg/hr Intravenous New Bag/Given 09/28/17 1434)  fentaNYL (SUBLIMAZE) 2,500 mcg in sodium chloride 0.9 % 250 mL (10 mcg/mL) infusion (50 mcg/hr Intravenous New Bag/Given 09/28/17 1330)  cefTRIAXone (ROCEPHIN) 1 g in dextrose 5 % 50 mL IVPB (not administered)  0.9 %  sodium chloride infusion (not administered)  aspirin chewable tablet 324 mg (324 mg Oral Not Given 09/28/17 1345)    Or  aspirin suppository 300 mg ( Rectal See Alternative 09/28/17 1345)  pantoprazole (PROTONIX) injection 40 mg (not administered)  enoxaparin (LOVENOX) injection 30 mg (not administered)  lactated ringers infusion ( Intravenous New Bag/Given 09/28/17 1438)  hydrocortisone sodium succinate (SOLU-CORTEF) 100 MG injection 50 mg (50 mg Intravenous Given 09/28/17 1507)  sodium chloride 0.9 % bolus 1,000 mL (0 mLs Intravenous Stopped 09/28/17 0935)  sodium chloride 0.9 % bolus 1,000 mL (0 mLs Intravenous Stopped 09/28/17 0935)  fentaNYL (SUBLIMAZE) injection 25 mcg (25 mcg Intravenous Given 09/28/17 0911)  iopamidol (ISOVUE-370) 76 % injection 100 mL (100 mLs Intravenous Contrast Given 09/28/17 1008)  fentaNYL (SUBLIMAZE) injection 75 mcg (75 mcg Intravenous Given 09/28/17 1051)  sodium chloride 0.9 % bolus 1,000 mL (0 mLs  Intravenous Stopped 09/28/17 1343)  piperacillin-tazobactam (ZOSYN) IVPB 3.375 g (0 g Intravenous Stopped 09/28/17 1138)  fentaNYL (SUBLIMAZE) injection 50 mcg (50 mcg Intravenous Given 09/28/17 1300)  midazolam (VERSED) injection 2 mg (2 mg Intravenous Given 09/28/17 1430)  vancomycin (VANCOCIN) IVPB 1000 mg/200 mL premix (0 mg Intravenous Stopped 09/28/17 1537)  ibuprofen (ADVIL,MOTRIN) 100 MG/5ML suspension 600 mg (600 mg Oral Given 09/28/17 1524)                                                                                                                                    Procedures Procedure Name: Intubation Date/Time: 09/28/2017 12:56 PM Performed by: Fatima Blank, MD Pre-anesthesia Checklist: Patient identified, Patient being monitored, Emergency Drugs available, Timeout performed and Suction available Oxygen Delivery Method: Non-rebreather mask Preoxygenation: Pre-oxygenation with 100% oxygen Induction Type: Rapid sequence Ventilation: Mask ventilation without difficulty Laryngoscope Size: Mac and 4 Grade View: Grade III Tube size: 7.5 mm Number of attempts: 1 Placement Confirmation: ETT inserted through vocal cords under direct vision,  CO2 detector and Breath sounds checked- equal and bilateral Secured at: 22 cm Tube secured with: ETT holder Difficulty Due To: Difficulty was anticipated      (including critical care time)   CRITICAL CARE Performed by: Grayce Sessions Cardama Total critical care time: 85 minutes Critical care time was exclusive of separately billable procedures and treating other patients. Critical care was necessary to treat or prevent imminent or life-threatening deterioration. Critical care was time spent personally by me on the following activities: development of treatment plan with patient and/or surrogate as well as nursing, discussions with consultants, evaluation of patient's response to treatment, examination of patient, obtaining history from  patient or surrogate, ordering and performing treatments and interventions, ordering and review of laboratory studies, ordering and review of radiographic studies, pulse oximetry and re-evaluation of patient's condition.   Medical Decision Making / ED Course I have reviewed the nursing notes for this encounter and the patient's prior records (if available in EHR or on provided paperwork).  Clinical Course as of Sep 29 1619  Sat Sep 28, 2017  0240 Considered amitriptyline or triptan side effect, tetany, toxidrome.  [PC]  0804 Lactic acid elevated. Believe this is due hypovolemia from dehydration. She is afebrile w/o source of infection at this time. Will reassess and expand treatment if needed.  [PC]  0940 Labs with leukopenia and transaminitis. Given the abd pain will order CT. Will also obtain CT PE study due to chest pain and SOB with elevated dimer.  [PC]  1042 CT scan revealed bilateral axillary LAD with gallbladder wall thickening concerning for acute acalculous cholecystitis.  Will start on empiric antibiotics at this time and confirm with a right upper quadrant ultrasound. Code sepsis will be initiated. She has already received 30cc/kg of IVF, and BP has improved. Will continue IVF.  [PC]  1215 Patient continues to deteriorate with regard to increased WOB and tachycardia. Discussed code status and she is a full code.  [PC]  1230 [place] image available and confirms the gallbladder wall thickening.  No stones noted.  This is concerning for acalculous cholecystitis.  Will discuss case with surgery.  Given her deterioration and possible neoplastic features, we will also discuss case with critical care  [PC]  1250 Patient was intubated due to increased work of breathing and respiratory decline.  I discussed the case with intensivist who accepted the patient to the ICU.  Awaiting surgery consult  [PC]  2 Spoke with Dr. Georgette Dover, from general surgery  who will consult on the patient.  His recommendation  at this time is medical resuscitation and antibiotic treatment.  [PC]    Clinical Course User Index [PC] Cardama, Grayce Sessions, MD     Final Clinical Impression(s) / ED Diagnoses Final diagnoses:  Abdominal pain  Myalgia  Transaminitis  Axillary lymphadenopathy  Respiratory distress  Neutropenia, unspecified type ([place])      This chart was dictated using voice recognition software.  Despite best efforts to proofread,  errors can occur which can change the documentation meaning.   Fatima Blank, MD 09/28/17 1622

## 2017-09-29 ENCOUNTER — Inpatient Hospital Stay (HOSPITAL_COMMUNITY): Payer: BLUE CROSS/BLUE SHIELD

## 2017-09-29 DIAGNOSIS — R410 Disorientation, unspecified: Secondary | ICD-10-CM

## 2017-09-29 DIAGNOSIS — R591 Generalized enlarged lymph nodes: Secondary | ICD-10-CM

## 2017-09-29 DIAGNOSIS — D72819 Decreased white blood cell count, unspecified: Secondary | ICD-10-CM

## 2017-09-29 DIAGNOSIS — R1084 Generalized abdominal pain: Secondary | ICD-10-CM

## 2017-09-29 DIAGNOSIS — A419 Sepsis, unspecified organism: Secondary | ICD-10-CM

## 2017-09-29 LAB — COMPREHENSIVE METABOLIC PANEL
ALT: 526 U/L — ABNORMAL HIGH (ref 14–54)
AST: 646 U/L — ABNORMAL HIGH (ref 15–41)
Albumin: 2.2 g/dL — ABNORMAL LOW (ref 3.5–5.0)
Alkaline Phosphatase: 135 U/L — ABNORMAL HIGH (ref 38–126)
Anion gap: 8 (ref 5–15)
BUN: 15 mg/dL (ref 6–20)
CO2: 17 mmol/L — ABNORMAL LOW (ref 22–32)
Calcium: 7.1 mg/dL — ABNORMAL LOW (ref 8.9–10.3)
Chloride: 107 mmol/L (ref 101–111)
Creatinine, Ser: 0.71 mg/dL (ref 0.44–1.00)
GFR calc Af Amer: >60 mL/min (ref >60)
GFR calc non Af Amer: >60 mL/min (ref >60)
Glucose, Bld: 151 mg/dL — ABNORMAL HIGH (ref 65–99)
Potassium: 4.1 mmol/L (ref 3.5–5.1)
Sodium: 132 mmol/L — ABNORMAL LOW (ref 135–145)
Total Bilirubin: 1.8 mg/dL — ABNORMAL HIGH (ref 0.3–1.2)
Total Protein: 5.2 g/dL — ABNORMAL LOW (ref 6.5–8.1)

## 2017-09-29 LAB — GLUCOSE, CAPILLARY
Glucose-Capillary: 106 mg/dL — ABNORMAL HIGH (ref 65–99)
Glucose-Capillary: 110 mg/dL — ABNORMAL HIGH (ref 65–99)
Glucose-Capillary: 114 mg/dL — ABNORMAL HIGH (ref 65–99)
Glucose-Capillary: 115 mg/dL — ABNORMAL HIGH (ref 65–99)
Glucose-Capillary: 118 mg/dL — ABNORMAL HIGH (ref 65–99)
Glucose-Capillary: 139 mg/dL — ABNORMAL HIGH (ref 65–99)

## 2017-09-29 LAB — CBC WITH DIFFERENTIAL/PLATELET
Basophils Absolute: 0 10*3/uL (ref 0.0–0.1)
Basophils Relative: 0 %
Eosinophils Absolute: 0 10*3/uL (ref 0.0–0.7)
Eosinophils Relative: 1 %
HCT: 32.7 % — ABNORMAL LOW (ref 36.0–46.0)
Hemoglobin: 11 g/dL — ABNORMAL LOW (ref 12.0–15.0)
Lymphocytes Relative: 8 %
Lymphs Abs: 0.3 10*3/uL — ABNORMAL LOW (ref 0.7–4.0)
MCH: 31.1 pg (ref 26.0–34.0)
MCHC: 33.6 g/dL (ref 30.0–36.0)
MCV: 92.4 fL (ref 78.0–100.0)
Monocytes Absolute: 0.3 10*3/uL (ref 0.1–1.0)
Monocytes Relative: 7 %
Neutro Abs: 3.1 10*3/uL (ref 1.7–7.7)
Neutrophils Relative %: 84 %
Platelets: 124 10*3/uL — ABNORMAL LOW (ref 150–400)
RBC: 3.54 MIL/uL — ABNORMAL LOW (ref 3.87–5.11)
RDW: 15.5 % (ref 11.5–15.5)
WBC: 3.7 10*3/uL — ABNORMAL LOW (ref 4.0–10.5)

## 2017-09-29 LAB — BLOOD GAS, ARTERIAL
Acid-base deficit: 3.9 mmol/L — ABNORMAL HIGH (ref 0.0–2.0)
Bicarbonate: 20.3 mmol/L (ref 20.0–28.0)
Drawn by: 51191
FIO2: 40
MECHVT: 360 mL
O2 Saturation: 99.4 %
PEEP: 5 cmH2O
Patient temperature: 98.6
RATE: 24 resp/min
pCO2 arterial: 34.9 mmHg (ref 32.0–48.0)
pH, Arterial: 7.382 (ref 7.350–7.450)
pO2, Arterial: 179 mmHg — ABNORMAL HIGH (ref 83.0–108.0)

## 2017-09-29 LAB — HEPATITIS PANEL, ACUTE
HCV Ab: <0.1 s/co ratio (ref 0.0–0.9)
Hep A IgM: NEGATIVE
Hep B C IgM: NEGATIVE
Hepatitis B Surface Ag: NEGATIVE

## 2017-09-29 LAB — STREP PNEUMONIAE URINARY ANTIGEN: Strep Pneumo Urinary Antigen: NEGATIVE

## 2017-09-29 LAB — RESPIRATORY PANEL BY PCR

## 2017-09-29 LAB — TOXOPLASMA ANTIBODIES- IGG AND  IGM
Toxoplasma Antibody- IgM: <3 AU/mL (ref 0.0–7.9)
Toxoplasma IgG Ratio: <3 IU/mL (ref 0.0–7.1)

## 2017-09-29 LAB — PHOSPHORUS: Phosphorus: 2.9 mg/dL (ref 2.5–4.6)

## 2017-09-29 LAB — RPR: RPR Ser Ql: NONREACTIVE

## 2017-09-29 LAB — LACTIC ACID, PLASMA: Lactic Acid, Venous: 1.7 mmol/L (ref 0.5–1.9)

## 2017-09-29 LAB — PROTIME-INR
INR: 1.52
Prothrombin Time: 18.2 seconds — ABNORMAL HIGH (ref 11.4–15.2)

## 2017-09-29 LAB — MAGNESIUM: Magnesium: 1.3 mg/dL — ABNORMAL LOW (ref 1.7–2.4)

## 2017-09-29 LAB — AMMONIA: Ammonia: 51 umol/L — ABNORMAL HIGH (ref 9–35)

## 2017-09-29 LAB — APTT: aPTT: 51 seconds — ABNORMAL HIGH (ref 24–36)

## 2017-09-29 LAB — PROCALCITONIN: Procalcitonin: 21.74 ng/mL

## 2017-09-29 LAB — HIV ANTIBODY (ROUTINE TESTING W REFLEX): HIV Screen 4th Generation wRfx: NONREACTIVE

## 2017-09-29 MED ORDER — FENTANYL CITRATE (PF) 100 MCG/2ML IJ SOLN
50.0000 ug | INTRAMUSCULAR | Status: DC | PRN
  Administered 2017-09-29 (×2): 50 ug via INTRAVENOUS
  Filled 2017-09-29 (×2): qty 2

## 2017-09-29 MED ORDER — INSULIN ASPART 100 UNIT/ML ~~LOC~~ SOLN
2.0000 [IU] | SUBCUTANEOUS | Status: DC
  Administered 2017-09-29: 2 [IU] via SUBCUTANEOUS

## 2017-09-29 MED ORDER — ORAL CARE MOUTH RINSE
15.0000 mL | Freq: Two times a day (BID) | OROMUCOSAL | Status: DC
  Administered 2017-09-30 – 2017-10-03 (×2): 15 mL via OROMUCOSAL

## 2017-09-29 MED ORDER — MAGNESIUM SULFATE 4 GM/100ML IV SOLN
4.0000 g | Freq: Once | INTRAVENOUS | Status: AC
  Administered 2017-09-29: 4 g via INTRAVENOUS
  Filled 2017-09-29: qty 100

## 2017-09-29 NOTE — Progress Notes (Addendum)
E-link made aware of patient's blood pressure; was told MAP above 60 is goal. No new orders received. Will continue to monitor.

## 2017-09-29 NOTE — Progress Notes (Addendum)
Subjective: Patient sedated and fentanyl and Versed and intubated in the ICU.  Not currently following commands but withdraws to noxious stimulation.  Currently not able to follow commands  Exam: Vitals:   09/29/17 1308 09/29/17 1315  BP: 112/68 101/62  Pulse: 97 100  Resp: (!) 24 (!) 24  Temp: 98.6 F (37 C) 98.6 F (37 C)  SpO2: 100% 100%    Physical Exam   HEENT-  Normocephalic, no lesions, without obvious abnormality.   Cardiovascular- S1-S2 audible, pulses palpable throughout   Lungs-ventilation breath sounds -patient currently intubated  abdomen- All 4 quadrants palpated and nontender Extremities- Warm, dry and intact Musculoskeletal-no joint tenderness, deformity or swelling Skin-warm and dry, no hyperpigmentation, vitiligo, or suspicious lesions    Neuro:  Mental Status: Patient intubated and sedated with fentanyl and Versed in the ICU. Opens eyes to voice and follows some commands. Cranial Nerves: II: Discs flat bilaterally; Visual fields grossly normal,  III,IV, VI: pupils sluggish equal, round, reactive to light and accommodation  Motor: moves all 4 extremities with good strength     Tone and bulk:normal tone throughout; no atrophy noted Sensory: Unable to accurately accurately assess  Deep Tendon Reflexes: 1+ and symmetric throughout Plantars: Right: No movement  left: No movement Cerebellar: Unable to accurately assess Gait: normal gait and station    Medications:  Current Facility-Administered Medications:  .  0.9 %  sodium chloride infusion, 250 mL, Intravenous, PRN, Etheleen Nicks, MD .  acetaminophen (TYLENOL) solution 650 mg, 650 mg, Per Tube, Q6H PRN, Etheleen Nicks, MD, 650 mg at 09/28/17 1723 .  cefTRIAXone (ROCEPHIN) 1 g in dextrose 5 % 50 mL IVPB, 1 g, Intravenous, Q24H, Etheleen Nicks, MD, Stopped at 09/28/17 1939 .  chlorhexidine gluconate (MEDLINE KIT) (PERIDEX) 0.12 % solution 15 mL, 15 mL, Mouth Rinse, BID, Etheleen Nicks, MD, 15 mL at  09/29/17 0731 .  enoxaparin (LOVENOX) injection 30 mg, 30 mg, Subcutaneous, Q24H, Etheleen Nicks, MD, 30 mg at 09/28/17 2313 .  fentaNYL (SUBLIMAZE) 2,500 mcg in sodium chloride 0.9 % 250 mL (10 mcg/mL) infusion, 0-400 mcg/hr, Intravenous, Continuous, Etheleen Nicks, MD, Last Rate: 7.5 mL/hr at 09/29/17 1000, 75 mcg/hr at 09/29/17 1000 .  fentaNYL (SUBLIMAZE) bolus via infusion 50 mcg, 50 mcg, Intravenous, Q1H PRN, Cardama, Grayce Sessions, MD, 50 mcg at 09/29/17 0354 .  hydrocortisone sodium succinate (SOLU-CORTEF) 100 MG injection 50 mg, 50 mg, Intravenous, Q6H, Etheleen Nicks, MD, 50 mg at 09/29/17 1006 .  insulin aspart (novoLOG) injection 2-6 Units, 2-6 Units, Subcutaneous, Q4H, Eubanks, Danie Chandler, NP .  lactated ringers infusion, , Intravenous, Continuous, Etheleen Nicks, MD, Last Rate: 100 mL/hr at 09/29/17 1000 .  MEDLINE mouth rinse, 15 mL, Mouth Rinse, QID, Etheleen Nicks, MD, 15 mL at 09/29/17 1255 .  midazolam (VERSED) 50 mg in sodium chloride 0.9 % 50 mL (1 mg/mL) infusion, 6 mg/hr, Intravenous, Continuous, Etheleen Nicks, MD, Last Rate: 1 mL/hr at 09/29/17 1000, 1 mg/hr at 09/29/17 1000 .  midazolam (VERSED) injection 2 mg, 2 mg, Intravenous, Q2H PRN, Cardama, Grayce Sessions, MD .  pantoprazole (PROTONIX) injection 40 mg, 40 mg, Intravenous, QHS, Etheleen Nicks, MD, 40 mg at 09/28/17 2127 .  vancomycin (VANCOCIN) IVPB 1000 mg/200 mL premix, 1,000 mg, Intravenous, Q24H, Etheleen Nicks, MD, Last Rate: 200 mL/hr at 09/29/17 1312, 1,000 mg at 09/29/17 1312  Pertinent Labs/Diagnostics:    Mr Brain Wo Contrast 09/29/2017 IMPRESSION:  1. No acute intracranial  abnormality.  2. Partially empty sella, otherwise unremarkable appearance of the brain  US Abdomen Limited Ruq 09/28/2017  Diffuse gallbladder wall thickening with, diffuse gallbladder wall thickening with areas of comet tail shadowing. This is compatible with adenomyomatosis. Otherwise, normal examination.   Assessment:  1.   Toxic/metabolic encephalopathy: (adverse Interaction of amitriptyline, Relpax) R/o meningitis and Guillian Barr - LP unremarkable with normal WBCs, protein and  supernatant with RBCs in CSF being most consistent with the bloody tap.  MRI brain without contrast did not show any acute intracranial abnormalities. 2.  Cholecystitis-HIDA scan pending 3.  Sepsis  Plan  MRI brain with and without contrast - unremarkable -- Wean sedation and extubate -- F/U liver enzymes, CK  --Blood cultures pending --Continue aggressive critical care/ sepsis management   [facility] Team [phone] 09/29/2017, 2:19 PM  NEUROHOSPITALIST ADDENDUM Seen and examined the patient today. Formulated plan as documented above by ARNP with few edits made.  I agree with recommendations as above. Will follow.  Karena Addison Aroor MD [facility] [phone]  If 7pm to 7am, please call on call as listed on AMION.

## 2017-09-29 NOTE — Progress Notes (Signed)
Pt transported to MRI with no complications noted. Pt returned to 4N20. RT notified of pt's return.

## 2017-09-29 NOTE — Progress Notes (Signed)
CCM made aware of redness of patient's skin and swelling. No new orders received, will continue to monitor.

## 2017-09-29 NOTE — Progress Notes (Signed)
PULMONARY / CRITICAL CARE MEDICINE   Name: Kathleen Costa MRN: [id number] DOB: 23-Jan-1966    ADMISSION DATE:  09/28/2017  CHIEF COMPLAINT:  Altered mental status generalized weakness and sepsis  HISTORY OF PRESENT ILLNESS:   History was obtained by the family patient is intubated very agitated not able to give any history 52 years old with history of rheumatoid arthritis migraine was twisting to her 3 grandkids called her daughter around 2 in the morning after she took amitriptyline she started feeling very sick and became unresponsive was brought to the ED with severe shortness of breath agitation was intubated for airway protection patient had a history of sinusitis 2 weeks ago she was given Valtrex according to the daughter patient had a history of rheumatoid arthritis has been controlled family is denying that the patient has history of alcohol drug abuse.  There was no prodrome before the patient went to sleep.  She is migrated from [place] 30 years ago  Patient underwent an LP yesterday. Results so far are negative. CT head unremarkable. Scheduled for MRI today.  Currently intubated, and sedated. On versed at 1mg /hr, Fentanyl at 23mcg/kg/hour. RASS is -2.  PAST MEDICAL HISTORY :  She  has a past medical history of Arthritis, Disturbance of skin sensation, Headache, Migraine without aura, without mention of intractable migraine without mention of status migrainosus (04/22/2014), and Rheumatoid arthritis ([place]).  PAST SURGICAL HISTORY: She  has no past surgical history on file.  No Known Allergies  No current facility-administered medications on file prior to encounter.    Current Outpatient Medications on File Prior to Encounter  Medication Sig  . amitriptyline (ELAVIL) 25 MG tablet Take 1 tablet (25 mg total) by mouth at bedtime.  Marland Kitchen aspirin-acetaminophen-caffeine (EXCEDRIN MIGRAINE) 250-250-65 MG tablet Take 2 tablets by mouth every 6 (six) hours as needed for headache.  .  Chlorphen-PE-Acetaminophen (NOREL AD) 4-10-325 MG TABS Take 1 tablet by mouth daily as needed (headache).  . eletriptan (RELPAX) 40 MG tablet Take 1 tablet (40 mg total) by mouth 2 (two) times daily as needed. (Patient taking differently: Take 40 mg by mouth 2 (two) times daily as needed for migraine. )  . erythromycin ophthalmic ointment Place a 1/2 inch ribbon of ointment into the lower eyelid of the left eye for 5 days 2 x a day.  . ibuprofen (ADVIL,MOTRIN) 200 MG tablet Take 400 mg by mouth every 6 (six) hours as needed for mild pain.  . predniSONE (STERAPRED UNI-PAK 21 TAB) 10 MG (21) TBPK tablet Take 20 mg by mouth daily.  . SUMAtriptan (IMITREX) 50 MG tablet Take 50 mg by mouth every 2 (two) hours as needed for migraine. May repeat in 2 hours if headache persists or recurs.  . methocarbamol (ROBAXIN) 500 MG tablet Take 1 tablet (500 mg total) by mouth 2 (two) times daily. (Patient not taking: Reported on 09/28/2017)  . naproxen (NAPROSYN) 500 MG tablet Take 1 tablet (500 mg total) by mouth 2 (two) times daily. (Patient not taking: Reported on 09/28/2017)  . predniSONE (DELTASONE) 5 MG tablet Begin taking 6 tablets daily, taper by one tablet daily until off the medication. (Patient not taking: Reported on 09/28/2017)  . SUMAtriptan (IMITREX) 100 MG tablet Take 1 tablet (100 mg total) by mouth 2 (two) times daily as needed for migraine. (Patient not taking: Reported on 09/28/2017)  . terbinafine (LAMISIL) 250 MG tablet Take 1 tablet (250 mg total) by mouth daily. (Patient not taking: Reported on 09/28/2017)  . valACYclovir (VALTREX)  1000 MG tablet Take 2 tabs PO every 12 hours, then stop. (Patient not taking: Reported on 09/28/2017)  . valACYclovir (VALTREX) 1000 MG tablet Take 1 tablet (1,000 mg total) by mouth 3 (three) times daily. For 10 days (Patient not taking: Reported on 09/28/2017)    FAMILY HISTORY:  Her indicated that her mother is deceased. She indicated that her father is deceased. She indicated  that her sister is alive. She indicated that three of her four brothers are alive.   SOCIAL HISTORY: She  reports that  has never smoked. she has never used smokeless tobacco. She reports that she does not drink alcohol or use drugs.  REVIEW OF SYSTEMS:   Cannot be obtained.  SUBJECTIVE:  Cannot be obtained.  VITAL SIGNS: BP 107/65   Pulse (!) 105   Temp 98.4 F (36.9 C)   Resp (!) 24   Ht 5' (1.524 m)   Wt 53.3 kg (117 lb 8.1 oz)   SpO2 100%   BMI 22.95 kg/m   HEMODYNAMICS:    VENTILATOR SETTINGS: Vent Mode: PRVC FiO2 (%):  [40 %-100 %] 40 % Set Rate:  [24 bmp] 24 bmp Vt Set:  [360 mL] 360 mL PEEP:  [5 cmH20] 5 cmH20 Plateau Pressure:  [14 cmH20-24 cmH20] 14 cmH20  INTAKE / OUTPUT: I/O last 3 completed shifts: In: 2422.1 [I.V.:1778.8; Other:10; IV Piggyback:633.3] Out: 1060 [Urine:1060]  PHYSICAL EXAMINATION: General:  Sedated, RASS of -2. Neuro:  Withdraws extremities to tactile stimulation. Bilateral reflexes +1 in all extremities. PERL, approximately 2-3 mm. No fasciulations or tonic clonic activity. HEENT:  ETT, OGT, no icterus. No jvd. Cardiovascular:  Normal s1s2, regular rhythm. No murmur, gallop or rub. Lungs: bilateral breath sounds. Clear to auscultation. No wheezes, crackles or rhonchi. Abdomen:  Soft, non tender. No organmegaly or mass. Negative Murphy's sign. + bowel sounds. Musculoskeletal:  No tenderness edema. +2 peripheral pulses.  Skin: no rash or lesion. No edema.  LABS:  BMET Recent Labs  Lab 09/28/17 0745 09/28/17 1402 09/29/17 0013  NA 135  --  132*  K 4.0  --  4.1  CL 102  --  107  CO2 24  --  17*  BUN 13  --  15  CREATININE 0.76 0.59 0.71  GLUCOSE 128*  --  151*    Electrolytes Recent Labs  Lab 09/28/17 0745 09/29/17 0013  CALCIUM 9.1 7.1*  MG 1.8 1.3*    CBC Recent Labs  Lab 09/28/17 0745 09/28/17 1402 09/29/17 0013  WBC 1.8* 4.2 3.7*  HGB 13.4 11.3* 11.0*  HCT 39.2 33.4* 32.7*  PLT 203 147* 124*     Coag's Recent Labs  Lab 09/29/17 0013  APTT 51*  INR 1.52    Sepsis Markers Recent Labs  Lab 09/28/17 0802 09/28/17 0946 09/29/17 0013  LATICACIDVEN 3.06* 2.77* 1.7  PROCALCITON  --   --  21.74    ABG Recent Labs  Lab 09/28/17 1422 09/29/17 0355  PHART 7.331* 7.382  PCO2ART 35.0 34.9  PO2ART 454* 179*    Liver Enzymes Recent Labs  Lab 09/28/17 0745 09/29/17 0013  AST 315* 646*  ALT 253* 526*  ALKPHOS 176* 135*  BILITOT 1.4* 1.8*  ALBUMIN 3.9 2.2*    Cardiac Enzymes No results for input(s): TROPONINI, PROBNP in the last 168 hours.  Glucose Recent Labs  Lab 09/29/17 0431 09/29/17 0815  GLUCAP 114* 110*    Imaging Ct Head Wo Contrast  Result Date: 09/28/2017 CLINICAL DATA:  ams  Had contrasted  scan this am EXAM: CT HEAD WITHOUT CONTRAST TECHNIQUE: Contiguous axial images were obtained from the base of the skull through the vertex without intravenous contrast. COMPARISON:  None. FINDINGS: Brain: There is no intra or extra-axial fluid collection or mass lesion. The basilar cisterns and ventricles have a normal appearance. There is no CT evidence for acute infarction or hemorrhage. There are bilateral basal ganglia calcifications. Vascular: No hyperdense vessel or unexpected calcification. Skull: Normal. Negative for fracture or focal lesion. Sinuses/Orbits: There is dense opacity throughout the paranasal sinuses. Mastoid air cells are normally aerated. Orbits are unremarkable. Other: None IMPRESSION: 1.  No evidence for acute intracranial abnormality. 2. Bilateral basal ganglia calcifications. 3. Paranasal sinus disease, likely chronic. Electronically Signed   By: Nolon Nations M.D.   On: 09/28/2017 15:59   Ct Angio Chest Pe W And/or Wo Contrast  Result Date: 09/28/2017 CLINICAL DATA:  Sudden weakness. Elevated D-dimer. Severe headache. EXAM: CT ANGIOGRAPHY CHEST CT ABDOMEN AND PELVIS WITH CONTRAST TECHNIQUE: Multidetector CT imaging of the chest was  performed using the standard protocol during bolus administration of intravenous contrast. Multiplanar CT image reconstructions and MIPs were obtained to evaluate the vascular anatomy. Multidetector CT imaging of the abdomen and pelvis was performed using the standard protocol during bolus administration of intravenous contrast. CONTRAST:  162mL ISOVUE-370 IOPAMIDOL (ISOVUE-370) INJECTION 76% COMPARISON:  None. FINDINGS: CTA CHEST FINDINGS Cardiovascular: Satisfactory opacification of the pulmonary arteries to the segmental level. No evidence of pulmonary embolism. Normal heart size. No pericardial effusion. Mediastinum/Nodes: No enlarged mediastinal, or hilar lymph nodes. Thyroid gland, trachea, and esophagus demonstrate no significant findings. Bilateral prominent axillary lymph nodes. Lungs/Pleura: Lungs are clear. No pleural effusion or pneumothorax. Musculoskeletal: No chest wall abnormality. No acute or significant osseous findings. Review of the MIP images confirms the above findings. CT ABDOMEN and PELVIS FINDINGS Hepatobiliary: Normal appearance of the liver. Focal hyperenhancement and wall thickening of the counter dependent inferior portion of the gallbladder. Pancreas: Unremarkable. No pancreatic ductal dilatation or surrounding inflammatory changes. Spleen: Normal in size without focal abnormality. Adrenals/Urinary Tract: Adrenal glands are unremarkable. Kidneys are normal, without renal calculi, focal lesion, or hydronephrosis. Bladder is unremarkable. Stomach/Bowel: Stomach is within normal limits. Appendix appears normal. No evidence of bowel wall thickening, distention, or inflammatory changes. Vascular/Lymphatic: No significant vascular findings are present. No enlarged abdominal or pelvic lymph nodes. Reproductive: Uterus and bilateral adnexa are unremarkable. Other: No abdominal wall hernia or abnormality. No abdominopelvic ascites. Musculoskeletal: No acute or significant osseous findings.  Review of the MIP images confirms the above findings. IMPRESSION: No evidence of pulmonary embolus. Moderate bilateral axillary lymphadenopathy, left greater than right. Further evaluation with focused bilateral axillary ultrasound may be considered to better characterize the morphology of the lymph nodes. Focal gallbladder wall thickening of the apex of the gallbladder. Right upper quadrant ultrasound may be considered for visualization of the gallbladder. Electronically Signed   By: Fidela Salisbury M.D.   On: 09/28/2017 10:35   Ct Abdomen Pelvis W Contrast  Result Date: 09/28/2017 CLINICAL DATA:  Sudden weakness. Elevated D-dimer. Severe headache. EXAM: CT ANGIOGRAPHY CHEST CT ABDOMEN AND PELVIS WITH CONTRAST TECHNIQUE: Multidetector CT imaging of the chest was performed using the standard protocol during bolus administration of intravenous contrast. Multiplanar CT image reconstructions and MIPs were obtained to evaluate the vascular anatomy. Multidetector CT imaging of the abdomen and pelvis was performed using the standard protocol during bolus administration of intravenous contrast. CONTRAST:  148mL ISOVUE-370 IOPAMIDOL (ISOVUE-370) INJECTION 76% COMPARISON:  None.  FINDINGS: CTA CHEST FINDINGS Cardiovascular: Satisfactory opacification of the pulmonary arteries to the segmental level. No evidence of pulmonary embolism. Normal heart size. No pericardial effusion. Mediastinum/Nodes: No enlarged mediastinal, or hilar lymph nodes. Thyroid gland, trachea, and esophagus demonstrate no significant findings. Bilateral prominent axillary lymph nodes. Lungs/Pleura: Lungs are clear. No pleural effusion or pneumothorax. Musculoskeletal: No chest wall abnormality. No acute or significant osseous findings. Review of the MIP images confirms the above findings. CT ABDOMEN and PELVIS FINDINGS Hepatobiliary: Normal appearance of the liver. Focal hyperenhancement and wall thickening of the counter dependent inferior  portion of the gallbladder. Pancreas: Unremarkable. No pancreatic ductal dilatation or surrounding inflammatory changes. Spleen: Normal in size without focal abnormality. Adrenals/Urinary Tract: Adrenal glands are unremarkable. Kidneys are normal, without renal calculi, focal lesion, or hydronephrosis. Bladder is unremarkable. Stomach/Bowel: Stomach is within normal limits. Appendix appears normal. No evidence of bowel wall thickening, distention, or inflammatory changes. Vascular/Lymphatic: No significant vascular findings are present. No enlarged abdominal or pelvic lymph nodes. Reproductive: Uterus and bilateral adnexa are unremarkable. Other: No abdominal wall hernia or abnormality. No abdominopelvic ascites. Musculoskeletal: No acute or significant osseous findings. Review of the MIP images confirms the above findings. IMPRESSION: No evidence of pulmonary embolus. Moderate bilateral axillary lymphadenopathy, left greater than right. Further evaluation with focused bilateral axillary ultrasound may be considered to better characterize the morphology of the lymph nodes. Focal gallbladder wall thickening of the apex of the gallbladder. Right upper quadrant ultrasound may be considered for visualization of the gallbladder. Electronically Signed   By: Fidela Salisbury M.D.   On: 09/28/2017 10:35   Dg Chest Port 1 View  Result Date: 09/28/2017 CLINICAL DATA:  Post intubation. EXAM: PORTABLE CHEST 1 VIEW COMPARISON:  CT of the chest 09/28/2017 FINDINGS: Post intubation. Endotracheal tube 2.6 cm above the carina. Enteric catheter with tip within the expected location of gastric body, side hole however slightly above the GE junction. Cardiomediastinal silhouette is normal. Mediastinal contours appear intact. There is no evidence of focal airspace consolidation, pleural effusion or pneumothorax. Osseous structures are without acute abnormality. Soft tissues are grossly normal. IMPRESSION: Post intubation and  appropriate positioning of the endotracheal tube. Enteric catheter side hole slightly above the expected location of the GE junction. Advancement may be considered. Electronically Signed   By: Fidela Salisbury M.D.   On: 09/28/2017 13:37   US Abdomen Limited Ruq  Result Date: 09/28/2017 CLINICAL DATA:  Right upper quadrant abdomen, chest and back pain since 3 a.m. today. Focal wall thickening and enhancement involving the apex of the gallbladder on an abdomen and pelvis CT earlier today. EXAM: ULTRASOUND ABDOMEN LIMITED RIGHT UPPER QUADRANT COMPARISON:  Abdomen and pelvis CT obtained earlier today. FINDINGS: Gallbladder: Diffuse gallbladder wall thickening with areas of comet tail shadowing. The maximum wall thickness is 3.5 mm. No gallstones or pericholecystic fluid. No sonographic Murphy sign. Common bile duct: Diameter: 2.6 mm Liver: No focal lesion identified. Within normal limits in parenchymal echogenicity. Portal vein is patent on color Doppler imaging with normal direction of blood flow towards the liver. IMPRESSION: Diffuse gallbladder wall thickening with, diffuse gallbladder wall thickening with areas of comet tail shadowing. This is compatible with adenomyomatosis. Otherwise, normal examination. Electronically Signed   By: Claudie Revering M.D.   On: 09/28/2017 13:16     STUDIES:  CT Head 2/2 DG Chest [facility]  Final Result    CT HEAD WO CONTRAST  Final Result    DG Chest [facility] 1 [address]  Final Result    US Abdomen Limited RUQ  Final Result    CT Abdomen Pelvis W Contrast  Final Result    CT Angio Chest PE W and/or Wo Contrast  Final Result    MR BRAIN WO CONTRAST    (Results Pending)  NM Hepatobiliary Liver Func    (Results Pending)     CULTURES: Negative to date.  ANTIBIOTICS: Rocephin Day #2 Vancomycin Day #2  SIGNIFICANT EVENTS: none  LINES/TUBES: ETT 2/2 OGT 2/2  DISCUSSION: 52 year old Asian female relatively previously healthy with rheumatoid arthritis  migraine had sudden onset of headache and sepsis today with altered mental status and ability to protect her airways for stat CT scan and possible LP   ASSESSMENT / PLAN:  PULMONARY A: Intubated because of respiratory insufficiency respiratory distress P:   VAP protocol no evidence of pneumonia or PE. Assess respiratory status after MRI  CARDIOVASCULAR Hemodynamically stable.   P:  We will hold off pressors for now  RENAL A:   IV hydration avoid nephrotoxic agents P:   Aggressive IV hydration especially with antibiotic use  GASTROINTESTINAL PPI coverage LFT elevation; continue to monitor For HIDA scan Hepatitis A, B and C are negative. Acetaminophen level was negative.  HEMATOLOGIC HIV is non reactive WBC improved to 3.7 Continue to monitor the cbc   INFECTIOUS Sepsis of unknown etiology CT scan ruled out with LP transferred to [facility] broad-spectrum antibiotic Cultures are pending. On empiric antibiotic coverage.   ENDOCRINE A:   None  P:   Blood sugars being monitored.  NEUROLOGIC A:   Encephalopathy P:   RASS goal: -2 MRI   FAMILY  - Updates: discussed care plan at the bedside with the son.   CRITICAL CARE Performed by: Jesus Genera   Total critical care time: 40 minutes  Critical care time was exclusive of separately billable procedures and treating other patients.  Critical care was necessary to treat or prevent imminent or life-threatening deterioration.  Critical care was time spent personally by me on the following activities: development of treatment plan with patient and/or surrogate as well as nursing, discussions with consultants, evaluation of patient's response to treatment, examination of patient, obtaining history from patient or surrogate, ordering and performing treatments and interventions, ordering and review of laboratory studies, ordering and review of radiographic studies, pulse oximetry and re-evaluation of  patient's condition.    Pulmonary and [place] Pager: [phone]  09/29/2017, 8:25 AM

## 2017-09-29 NOTE — Progress Notes (Signed)
Subjective/Chief Complaint: Remains on the vent, sedated   Objective: Vital signs in last 24 hours: Temp:  [98.1 F (36.7 C)-102.9 F (39.4 C)] 98.4 F (36.9 C) (02/03 0700) Pulse Rate:  [99-150] 105 (02/03 0739) Resp:  [0-42] 24 (02/03 0739) BP: (83-145)/(53-86) 107/65 (02/03 0739) SpO2:  [89 %-100 %] 100 % (02/03 0739) FiO2 (%):  [40 %-100 %] 40 % (02/03 0739) Weight:  [53.3 kg (117 lb 8.1 oz)-53.6 kg (118 lb 2.7 oz)] 53.3 kg (117 lb 8.1 oz) (02/03 0439)    Intake/Output from previous day: 02/02 0701 - 02/03 0700 In: 2422.1 [I.V.:1778.8; IV Piggyback:633.3] Out: 1060 [Urine:1060] Intake/Output this shift: No intake/output data recorded.  Exam: Intubated and sedated Abdomen soft, with questionable guarding in the RUQ  Lab Results:  Recent Labs    09/28/17 1402 09/29/17 0013  WBC 4.2 3.7*  HGB 11.3* 11.0*  HCT 33.4* 32.7*  PLT 147* 124*   BMET Recent Labs    09/28/17 0745 09/28/17 1402 09/29/17 0013  NA 135  --  132*  K 4.0  --  4.1  CL 102  --  107  CO2 24  --  17*  GLUCOSE 128*  --  151*  BUN 13  --  15  CREATININE 0.76 0.59 0.71  CALCIUM 9.1  --  7.1*   PT/INR Recent Labs    09/29/17 0013  LABPROT 18.2*  INR 1.52   ABG Recent Labs    09/28/17 1422 09/29/17 0355  PHART 7.331* 7.382  HCO3 17.4* 20.3    Studies/Results: Ct Head Wo Contrast  Result Date: 09/28/2017 CLINICAL DATA:  ams  Had contrasted scan this am EXAM: CT HEAD WITHOUT CONTRAST TECHNIQUE: Contiguous axial images were obtained from the base of the skull through the vertex without intravenous contrast. COMPARISON:  None. FINDINGS: Brain: There is no intra or extra-axial fluid collection or mass lesion. The basilar cisterns and ventricles have a normal appearance. There is no CT evidence for acute infarction or hemorrhage. There are bilateral basal ganglia calcifications. Vascular: No hyperdense vessel or unexpected calcification. Skull: Normal. Negative for fracture or focal  lesion. Sinuses/Orbits: There is dense opacity throughout the paranasal sinuses. Mastoid air cells are normally aerated. Orbits are unremarkable. Other: None IMPRESSION: 1.  No evidence for acute intracranial abnormality. 2. Bilateral basal ganglia calcifications. 3. Paranasal sinus disease, likely chronic. Electronically Signed   By: Nolon Nations M.D.   On: 09/28/2017 15:59   Ct Angio Chest Pe W And/or Wo Contrast  Result Date: 09/28/2017 CLINICAL DATA:  Sudden weakness. Elevated D-dimer. Severe headache. EXAM: CT ANGIOGRAPHY CHEST CT ABDOMEN AND PELVIS WITH CONTRAST TECHNIQUE: Multidetector CT imaging of the chest was performed using the standard protocol during bolus administration of intravenous contrast. Multiplanar CT image reconstructions and MIPs were obtained to evaluate the vascular anatomy. Multidetector CT imaging of the abdomen and pelvis was performed using the standard protocol during bolus administration of intravenous contrast. CONTRAST:  130mL ISOVUE-370 IOPAMIDOL (ISOVUE-370) INJECTION 76% COMPARISON:  None. FINDINGS: CTA CHEST FINDINGS Cardiovascular: Satisfactory opacification of the pulmonary arteries to the segmental level. No evidence of pulmonary embolism. Normal heart size. No pericardial effusion. Mediastinum/Nodes: No enlarged mediastinal, or hilar lymph nodes. Thyroid gland, trachea, and esophagus demonstrate no significant findings. Bilateral prominent axillary lymph nodes. Lungs/Pleura: Lungs are clear. No pleural effusion or pneumothorax. Musculoskeletal: No chest wall abnormality. No acute or significant osseous findings. Review of the MIP images confirms the above findings. CT ABDOMEN and PELVIS FINDINGS Hepatobiliary: Normal appearance  of the liver. Focal hyperenhancement and wall thickening of the counter dependent inferior portion of the gallbladder. Pancreas: Unremarkable. No pancreatic ductal dilatation or surrounding inflammatory changes. Spleen: Normal in size without  focal abnormality. Adrenals/Urinary Tract: Adrenal glands are unremarkable. Kidneys are normal, without renal calculi, focal lesion, or hydronephrosis. Bladder is unremarkable. Stomach/Bowel: Stomach is within normal limits. Appendix appears normal. No evidence of bowel wall thickening, distention, or inflammatory changes. Vascular/Lymphatic: No significant vascular findings are present. No enlarged abdominal or pelvic lymph nodes. Reproductive: Uterus and bilateral adnexa are unremarkable. Other: No abdominal wall hernia or abnormality. No abdominopelvic ascites. Musculoskeletal: No acute or significant osseous findings. Review of the MIP images confirms the above findings. IMPRESSION: No evidence of pulmonary embolus. Moderate bilateral axillary lymphadenopathy, left greater than right. Further evaluation with focused bilateral axillary ultrasound may be considered to better characterize the morphology of the lymph nodes. Focal gallbladder wall thickening of the apex of the gallbladder. Right upper quadrant ultrasound may be considered for visualization of the gallbladder. Electronically Signed   By: Fidela Salisbury M.D.   On: 09/28/2017 10:35   Ct Abdomen Pelvis W Contrast  Result Date: 09/28/2017 CLINICAL DATA:  Sudden weakness. Elevated D-dimer. Severe headache. EXAM: CT ANGIOGRAPHY CHEST CT ABDOMEN AND PELVIS WITH CONTRAST TECHNIQUE: Multidetector CT imaging of the chest was performed using the standard protocol during bolus administration of intravenous contrast. Multiplanar CT image reconstructions and MIPs were obtained to evaluate the vascular anatomy. Multidetector CT imaging of the abdomen and pelvis was performed using the standard protocol during bolus administration of intravenous contrast. CONTRAST:  19mL ISOVUE-370 IOPAMIDOL (ISOVUE-370) INJECTION 76% COMPARISON:  None. FINDINGS: CTA CHEST FINDINGS Cardiovascular: Satisfactory opacification of the pulmonary arteries to the segmental level.  No evidence of pulmonary embolism. Normal heart size. No pericardial effusion. Mediastinum/Nodes: No enlarged mediastinal, or hilar lymph nodes. Thyroid gland, trachea, and esophagus demonstrate no significant findings. Bilateral prominent axillary lymph nodes. Lungs/Pleura: Lungs are clear. No pleural effusion or pneumothorax. Musculoskeletal: No chest wall abnormality. No acute or significant osseous findings. Review of the MIP images confirms the above findings. CT ABDOMEN and PELVIS FINDINGS Hepatobiliary: Normal appearance of the liver. Focal hyperenhancement and wall thickening of the counter dependent inferior portion of the gallbladder. Pancreas: Unremarkable. No pancreatic ductal dilatation or surrounding inflammatory changes. Spleen: Normal in size without focal abnormality. Adrenals/Urinary Tract: Adrenal glands are unremarkable. Kidneys are normal, without renal calculi, focal lesion, or hydronephrosis. Bladder is unremarkable. Stomach/Bowel: Stomach is within normal limits. Appendix appears normal. No evidence of bowel wall thickening, distention, or inflammatory changes. Vascular/Lymphatic: No significant vascular findings are present. No enlarged abdominal or pelvic lymph nodes. Reproductive: Uterus and bilateral adnexa are unremarkable. Other: No abdominal wall hernia or abnormality. No abdominopelvic ascites. Musculoskeletal: No acute or significant osseous findings. Review of the MIP images confirms the above findings. IMPRESSION: No evidence of pulmonary embolus. Moderate bilateral axillary lymphadenopathy, left greater than right. Further evaluation with focused bilateral axillary ultrasound may be considered to better characterize the morphology of the lymph nodes. Focal gallbladder wall thickening of the apex of the gallbladder. Right upper quadrant ultrasound may be considered for visualization of the gallbladder. Electronically Signed   By: Fidela Salisbury M.D.   On: 09/28/2017 10:35    Dg Chest Port 1 View  Result Date: 09/28/2017 CLINICAL DATA:  Post intubation. EXAM: PORTABLE CHEST 1 VIEW COMPARISON:  CT of the chest 09/28/2017 FINDINGS: Post intubation. Endotracheal tube 2.6 cm above the carina. Enteric catheter with tip within  the expected location of gastric body, side hole however slightly above the GE junction. Cardiomediastinal silhouette is normal. Mediastinal contours appear intact. There is no evidence of focal airspace consolidation, pleural effusion or pneumothorax. Osseous structures are without acute abnormality. Soft tissues are grossly normal. IMPRESSION: Post intubation and appropriate positioning of the endotracheal tube. Enteric catheter side hole slightly above the expected location of the GE junction. Advancement may be considered. Electronically Signed   By: Fidela Salisbury M.D.   On: 09/28/2017 13:37   US Abdomen Limited Ruq  Result Date: 09/28/2017 CLINICAL DATA:  Right upper quadrant abdomen, chest and back pain since 3 a.m. today. Focal wall thickening and enhancement involving the apex of the gallbladder on an abdomen and pelvis CT earlier today. EXAM: ULTRASOUND ABDOMEN LIMITED RIGHT UPPER QUADRANT COMPARISON:  Abdomen and pelvis CT obtained earlier today. FINDINGS: Gallbladder: Diffuse gallbladder wall thickening with areas of comet tail shadowing. The maximum wall thickness is 3.5 mm. No gallstones or pericholecystic fluid. No sonographic Murphy sign. Common bile duct: Diameter: 2.6 mm Liver: No focal lesion identified. Within normal limits in parenchymal echogenicity. Portal vein is patent on color Doppler imaging with normal direction of blood flow towards the liver. IMPRESSION: Diffuse gallbladder wall thickening with, diffuse gallbladder wall thickening with areas of comet tail shadowing. This is compatible with adenomyomatosis. Otherwise, normal examination. Electronically Signed   By: Claudie Revering M.D.   On: 09/28/2017 13:16     Anti-infectives: Anti-infectives (From admission, onward)   Start     Dose/Rate Route Frequency Ordered Stop   09/29/17 1400  vancomycin (VANCOCIN) IVPB 1000 mg/200 mL premix     1,000 mg 200 mL/hr over 60 Minutes Intravenous Every 24 hours 09/28/17 1703     09/28/17 1800  cefTRIAXone (ROCEPHIN) 1 g in dextrose 5 % 50 mL IVPB     1 g 100 mL/hr over 30 Minutes Intravenous Every 24 hours 09/28/17 1334     09/28/17 1400  vancomycin (VANCOCIN) IVPB 1000 mg/200 mL premix     1,000 mg 200 mL/hr over 60 Minutes Intravenous  Once 09/28/17 1345 09/28/17 1537   09/28/17 1100  piperacillin-tazobactam (ZOSYN) IVPB 3.375 g     3.375 g 100 mL/hr over 30 Minutes Intravenous  Once 09/28/17 1048 09/28/17 1138      Assessment/Plan: Patient with headache, resp failure, etiology uncertain, on vent, elevated LFT's, possible cholecystitis  Will order a HIDA scan to determine is there is cholecystitis present.  If so, would probably need IR to place cholecystostomy tube given overall condition.   LOS: 1 day    BLACKMAN,DOUGLAS A 09/29/2017

## 2017-09-29 NOTE — Procedures (Signed)
Extubation Procedure Note  Patient Details:   Name: Kathleen Costa DOB: [birth year] MRN: [id number]   Airway Documentation:    Positive cuff leak prior to extubation.  Evaluation  O2 sats: stable throughout Complications: No apparent complications Patient did tolerate procedure well. Bilateral Breath Sounds: Clear   Yes  RN at bedside during extubation. Placed pt on 4L [place] and no signs of distress or stridor noted.   Elwin Mocha 09/29/2017, 5:56 PM

## 2017-09-29 NOTE — Progress Notes (Signed)
Patient re-examined at about 8 PM Saturday evening. 1+ symmetric reflexes noted to upper and lower extremities bilaterally while on light Versed sedation and moderate Fentanyl sedation. LP unremarkable so far, with normal WBC, normal protein and clear supernatant, with RBCs in CSF being most consistent with bloody tap.   Overall impression is that the presentation is not consistent with GBS.   Electronically signed: Dr. Kerney Elbe

## 2017-09-30 ENCOUNTER — Inpatient Hospital Stay (HOSPITAL_COMMUNITY): Payer: BLUE CROSS/BLUE SHIELD

## 2017-09-30 DIAGNOSIS — R4182 Altered mental status, unspecified: Secondary | ICD-10-CM

## 2017-09-30 LAB — BASIC METABOLIC PANEL
Anion gap: 7 (ref 5–15)
BUN: 10 mg/dL (ref 6–20)
CO2: 22 mmol/L (ref 22–32)
Calcium: 8.1 mg/dL — ABNORMAL LOW (ref 8.9–10.3)
Chloride: 108 mmol/L (ref 101–111)
Creatinine, Ser: 0.6 mg/dL (ref 0.44–1.00)
GFR calc Af Amer: >60 mL/min (ref >60)
GFR calc non Af Amer: >60 mL/min (ref >60)
Glucose, Bld: 106 mg/dL — ABNORMAL HIGH (ref 65–99)
Potassium: 4.2 mmol/L (ref 3.5–5.1)
Sodium: 137 mmol/L (ref 135–145)

## 2017-09-30 LAB — HEPATIC FUNCTION PANEL
ALT: 364 U/L — ABNORMAL HIGH (ref 14–54)
AST: 235 U/L — ABNORMAL HIGH (ref 15–41)
Albumin: 2.4 g/dL — ABNORMAL LOW (ref 3.5–5.0)
Alkaline Phosphatase: 132 U/L — ABNORMAL HIGH (ref 38–126)
Bilirubin, Direct: 0.2 mg/dL (ref 0.1–0.5)
Indirect Bilirubin: 0.7 mg/dL (ref 0.3–0.9)
Total Bilirubin: 0.9 mg/dL (ref 0.3–1.2)
Total Protein: 5.6 g/dL — ABNORMAL LOW (ref 6.5–8.1)

## 2017-09-30 LAB — GLUCOSE, CAPILLARY
Glucose-Capillary: 110 mg/dL — ABNORMAL HIGH (ref 65–99)
Glucose-Capillary: 116 mg/dL — ABNORMAL HIGH (ref 65–99)
Glucose-Capillary: 156 mg/dL — ABNORMAL HIGH (ref 65–99)
Glucose-Capillary: 96 mg/dL (ref 65–99)
Glucose-Capillary: 86 mg/dL (ref 65–99)

## 2017-09-30 LAB — PROTIME-INR
INR: 1.09
Prothrombin Time: 14 seconds (ref 11.4–15.2)

## 2017-09-30 LAB — CBC
HCT: 32 % — ABNORMAL LOW (ref 36.0–46.0)
Hemoglobin: 11 g/dL — ABNORMAL LOW (ref 12.0–15.0)
MCH: 31.2 pg (ref 26.0–34.0)
MCHC: 34.4 g/dL (ref 30.0–36.0)
MCV: 90.7 fL (ref 78.0–100.0)
Platelets: 150 10*3/uL (ref 150–400)
RBC: 3.53 MIL/uL — ABNORMAL LOW (ref 3.87–5.11)
RDW: 15.9 % — ABNORMAL HIGH (ref 11.5–15.5)
WBC: 6.3 10*3/uL (ref 4.0–10.5)

## 2017-09-30 LAB — HERPES SIMPLEX VIRUS(HSV) DNA BY PCR
HSV 1 DNA: NEGATIVE
HSV 2 DNA: NEGATIVE

## 2017-09-30 LAB — CK: Total CK: 391 U/L — ABNORMAL HIGH (ref 38–234)

## 2017-09-30 LAB — C-REACTIVE PROTEIN: CRP: 9 mg/dL — ABNORMAL HIGH (ref <1.0)

## 2017-09-30 LAB — SEDIMENTATION RATE: Sed Rate: 45 mm/hr — ABNORMAL HIGH (ref 0–22)

## 2017-09-30 LAB — PROCALCITONIN: Procalcitonin: 13.86 ng/mL

## 2017-09-30 MED ORDER — BUTALBITAL-APAP-CAFFEINE 50-325-40 MG PO TABS
2.0000 | ORAL_TABLET | Freq: Four times a day (QID) | ORAL | Status: DC | PRN
  Administered 2017-09-30 – 2017-10-03 (×5): 2 via ORAL
  Filled 2017-09-30 (×5): qty 2

## 2017-09-30 MED ORDER — TECHNETIUM TC 99M MEBROFENIN IV KIT
5.0000 | PACK | Freq: Once | INTRAVENOUS | Status: AC | PRN
  Administered 2017-09-30: 5 via INTRAVENOUS

## 2017-09-30 MED ORDER — METOCLOPRAMIDE HCL 5 MG/ML IJ SOLN
10.0000 mg | Freq: Four times a day (QID) | INTRAMUSCULAR | Status: DC | PRN
  Administered 2017-10-01: 10 mg via INTRAVENOUS
  Filled 2017-09-30: qty 2

## 2017-09-30 MED ORDER — ACETAMINOPHEN 325 MG PO TABS
650.0000 mg | ORAL_TABLET | Freq: Four times a day (QID) | ORAL | Status: DC | PRN
  Administered 2017-10-01 – 2017-10-03 (×4): 650 mg via ORAL
  Filled 2017-09-30 (×4): qty 2

## 2017-09-30 NOTE — Progress Notes (Signed)
Subjective: Greatly improved, but still has some numbness of the left hand.  Her left hand is also severely edematous.  Exam: Vitals:   09/30/17 1400 09/30/17 1600  BP: 122/84 120/81  Pulse:    Resp: (!) 30 20  Temp:  97.9 F (36.6 C)  SpO2:     Gen: In bed, NAD Resp: non-labored breathing, no acute distress Abd: soft, nt Ext: Significant edema, worst in the left hand.  She has a papular rash over the ulnar radial aspect of her forearm bilaterally, diffuse macular rash on her chest, legs  Neuro: MS: Awake, alert, interactive and appropriate CN: Peoples round and reactive, extra ocular movements intact, visual fields full Motor: She has good strength in bilateral legs in the right arm, and the left arm she is able to give good resistance proximally, but guards due to pain, she gives very little effort in her hand, unclear how much is due to pain versus weakness Sensory: She endorses a circumferential decreased light touch, vibration, temperature below just above the wrist. DTR: 2+ and symmetric at the biceps, triceps, brachioradialis, patella  Pertinent Labs: ESR 45 CRP 9.0 Pro calcitonin elevated  CSF from 2/2 was unremarkable.  Impression: 52 year old female with diffuse pain, subjective numbness and weakness.  I am not sure that a primary neurological event is going on, but I am very concerned about rheumatological causes of the symptoms.  Especially given the elevations in her inflammatory markers which are quite pronounced.  I do think that further imaging with her cervical spine is prudent, but I suspect right now that her left hand numbness/weakness which does not fit a peripheral nerve distribution, is more likely to be due to the edema.  Typically if this was a vasculitic neuropathy, it would not get better and would fit a peripheral nerve distribution.    She also complains of headaches which is a chronic problem for her.  Recommendations: 1) MRI cervical spine 2) could  use Reglan as needed for headaches. 3) neurology will continue to follow  Roland Rack, MD [facility] [phone]  If 7pm- 7am, please page neurology on call as listed in [place].

## 2017-09-30 NOTE — Progress Notes (Signed)
Subjective/Chief Complaint: DENIES ABDOMINAL PAIN   Objective: Vital signs in last 24 hours: Temp:  [97.6 F (36.4 C)-99.5 F (37.5 C)] 97.6 F (36.4 C) (02/04 1200) Pulse Rate:  [84-108] 91 (02/04 1000) Resp:  [10-24] 15 (02/04 1000) BP: (94-134)/(53-92) 134/88 (02/04 1000) SpO2:  [96 %-100 %] 98 % (02/04 1000) FiO2 (%):  [40 %] 40 % (02/03 1710) Weight:  [51.2 kg (112 lb 14 oz)] 51.2 kg (112 lb 14 oz) (02/04 0754)    Intake/Output from previous day: 02/03 0701 - 02/04 0700 In: 1606.3 [P.O.:25; I.V.:1581.3] Out: 3085 [Urine:3085] Intake/Output this shift: Total I/O In: -  Out: 520 [Urine:520]  GI: soft, non-tender; bowel sounds normal; no masses,  no organomegaly  Lab Results:  Recent Labs    09/29/17 0013 09/30/17 0407  WBC 3.7* 6.3  HGB 11.0* 11.0*  HCT 32.7* 32.0*  PLT 124* 150   BMET Recent Labs    09/29/17 0013 09/30/17 0407  NA 132* 137  K 4.1 4.2  CL 107 108  CO2 17* 22  GLUCOSE 151* 106*  BUN 15 10  CREATININE 0.71 0.60  CALCIUM 7.1* 8.1*   PT/INR Recent Labs    09/29/17 0013 09/30/17 0407  LABPROT 18.2* 14.0  INR 1.52 1.09   ABG Recent Labs    09/28/17 1422 09/29/17 0355  PHART 7.331* 7.382  HCO3 17.4* 20.3    Studies/Results: Ct Head Wo Contrast  Result Date: 09/28/2017 CLINICAL DATA:  ams  Had contrasted scan this am EXAM: CT HEAD WITHOUT CONTRAST TECHNIQUE: Contiguous axial images were obtained from the base of the skull through the vertex without intravenous contrast. COMPARISON:  None. FINDINGS: Brain: There is no intra or extra-axial fluid collection or mass lesion. The basilar cisterns and ventricles have a normal appearance. There is no CT evidence for acute infarction or hemorrhage. There are bilateral basal ganglia calcifications. Vascular: No hyperdense vessel or unexpected calcification. Skull: Normal. Negative for fracture or focal lesion. Sinuses/Orbits: There is dense opacity throughout the paranasal sinuses.  Mastoid air cells are normally aerated. Orbits are unremarkable. Other: None IMPRESSION: 1.  No evidence for acute intracranial abnormality. 2. Bilateral basal ganglia calcifications. 3. Paranasal sinus disease, likely chronic. Electronically Signed   By: Nolon Nations M.D.   On: 09/28/2017 15:59   Mr Brain Wo Contrast  Result Date: 09/29/2017 CLINICAL DATA:  Diffuse body pain and numbness. Fever and tachycardia. EXAM: MRI HEAD WITHOUT CONTRAST TECHNIQUE: Multiplanar, multiecho pulse sequences of the brain and surrounding structures were obtained without intravenous contrast. COMPARISON:  Head CT 09/28/2017 FINDINGS: Brain: There is no evidence of acute infarct, intracranial hemorrhage, mass, midline shift, or extra-axial fluid collection. The ventricles and sulci are normal. The brain is normal in signal. A partially empty sella is noted. Vascular: Major intracranial vascular flow voids are preserved. Skull and upper cervical spine: Unremarkable bone marrow signal. Sinuses/Orbits: Unremarkable orbits. Moderate, relatively diffuse paranasal sinus mucosal thickening. Left maxillary sinus mucous retention cysts. Small bilateral mastoid effusions. Other: None. IMPRESSION: 1. No acute intracranial abnormality. 2. Partially empty sella, otherwise unremarkable appearance of the brain. Electronically Signed   By: Logan Bores M.D.   On: 09/29/2017 12:21   Nm Hepatobiliary Liver Func  Result Date: 09/30/2017 CLINICAL DATA:  Abdominal pain, possible cholecystitis on CT and ultrasound. EXAM: NUCLEAR MEDICINE HEPATOBILIARY IMAGING TECHNIQUE: Sequential images of the abdomen were obtained out to 60 minutes following intravenous administration of radiopharmaceutical. RADIOPHARMACEUTICALS:  5.2 mCi Tc-57m  Choletec IV COMPARISON:  Abdominal ultrasound  of September 28, 2017 FINDINGS: Prompt uptake and biliary excretion of activity by the liver is seen. Gallbladder activity is visualized, consistent with patency of cystic  duct. Biliary activity passes into small bowel, consistent with patent common bile duct. IMPRESSION: Normal hepatobiliary scan.  Patent cystic and common bile ducts. Electronically Signed   By: David  Martinique M.D.   On: 09/30/2017 12:13   Dg Hand 2 View Right  Result Date: 09/30/2017 CLINICAL DATA:  Rheumatoid arthritis. Bilateral hand swelling and pain. EXAM: RIGHT HAND - 2 VIEW COMPARISON:  None. FINDINGS: Marked soft tissue swelling throughout the hand and fingers. No underlying acute osseous abnormality. No osseous erosions or joint subluxations. IMPRESSION: Diffuse soft tissue swelling. No osseous findings of rheumatoid arthritis. No acute osseous abnormality. Electronically Signed   By: Lorin Picket M.D.   On: 09/30/2017 11:23   Dg Hand 2 View Left  Result Date: 09/30/2017 CLINICAL DATA:  Rheumatoid arthritis with hand swelling EXAM: LEFT HAND - 2 VIEW COMPARISON:  None. FINDINGS: Marked diffuse dorsal soft tissue swelling. No underlying bone erosions identified. Overall bone mineralization appears normal. The joint spaces appear relatively well preserved. IMPRESSION: 1. Marked dorsal soft tissue swelling. 2. No focal bone abnormalities. Electronically Signed   By: Kerby Moors M.D.   On: 09/30/2017 11:23   Dg Chest Port 1 View  Result Date: 09/30/2017 CLINICAL DATA:  Respiratory failure EXAM: PORTABLE CHEST 1 VIEW COMPARISON:  09/29/2017 FINDINGS: Interval extubation.  Removal enteric tube. Lungs are essentially clear. Possible trace right pleural effusion. No pneumothorax. The heart is normal in size. IMPRESSION: Interval extubation. Possible trace right pleural effusion. Electronically Signed   By: Julian Hy M.D.   On: 09/30/2017 07:18   Dg Chest Port 1 View  Result Date: 09/29/2017 CLINICAL DATA:  Endotracheal tube in place. EXAM: PORTABLE CHEST 1 VIEW COMPARISON:  09/28/2017 FINDINGS: Endotracheal tube terminates approximately 2.4 cm above the carina. Enteric tube courses into the  stomach with tip not imaged. The cardiomediastinal silhouette is within normal limits. No airspace consolidation, sizeable pleural effusion, or pneumothorax is identified. IMPRESSION: Support devices as above.  No evidence of acute airspace disease. Electronically Signed   By: Logan Bores M.D.   On: 09/29/2017 08:26    Anti-infectives: Anti-infectives (From admission, onward)   Start     Dose/Rate Route Frequency Ordered Stop   09/29/17 1400  vancomycin (VANCOCIN) IVPB 1000 mg/200 mL premix  Status:  Discontinued     1,000 mg 200 mL/hr over 60 Minutes Intravenous Every 24 hours 09/28/17 1703 09/30/17 0849   09/28/17 1800  cefTRIAXone (ROCEPHIN) 1 g in dextrose 5 % 50 mL IVPB  Status:  Discontinued     1 g 100 mL/hr over 30 Minutes Intravenous Every 24 hours 09/28/17 1334 09/30/17 0849   09/28/17 1400  vancomycin (VANCOCIN) IVPB 1000 mg/200 mL premix     1,000 mg 200 mL/hr over 60 Minutes Intravenous  Once 09/28/17 1345 09/28/17 1537   09/28/17 1100  piperacillin-tazobactam (ZOSYN) IVPB 3.375 g     3.375 g 100 mL/hr over 30 Minutes Intravenous  Once 09/28/17 1048 09/28/17 1138      Assessment/Plan: Patient Active Problem List   Diagnosis Date Noted  . Sepsis ([place]) 09/28/2017  . Leukopenia 09/28/2017  . Lymphadenopathy 09/28/2017  . Altered mental status 09/28/2017  . Abdominal pain   . Common migraine with intractable migraine 04/22/2014  NEGATIVE HIDA  Advance diet   No acute surgical need    LOS: 2 days  Marcello Moores A Cornett 09/30/2017

## 2017-09-30 NOTE — Progress Notes (Signed)
PULMONARY / CRITICAL CARE MEDICINE   Name: Kathleen Costa MRN: [id number] DOB: Nov 19, 1965    ADMISSION DATE:  09/28/2017  CHIEF COMPLAINT:  Altered mental status generalized weakness and sepsis  HISTORY OF PRESENT ILLNESS:  52 years old with history of rheumatoid arthritis migraine was twisting to her 3 grandkids called her daughter around 2 in the morning after she took amitriptyline she started feeling very sick and became unresponsive was brought to the ED with severe shortness of breath agitation was intubated for airway protection patient had a history of sinusitis 2 weeks ago she was given Valtrex according to the daughter patient had a history of rheumatoid arthritis has been controlled family is denying that the patient has history of alcohol drug abuse.  There was no prodrome before the patient went to sleep.  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  She underwent an LP which was negative.  Extubated 2/3 without difficulty.    SUBJECTIVE:  RN reports pt c/o hand pain, inability to close hands.  No acute events.  Pt tearful when talking about losing her job > states she "wants to work, loved her job".   VITAL SIGNS: BP 114/76   Pulse 85   Temp 98.2 F (36.8 C)   Resp 12   Ht 5' (1.524 m)   Wt 112 lb 14 oz (51.2 kg)   SpO2 99%   BMI 22.04 kg/m   HEMODYNAMICS:    VENTILATOR SETTINGS: Vent Mode: PSV;CPAP FiO2 (%):  [40 %] 40 % Set Rate:  [24 bmp] 24 bmp Vt Set:  [360 mL] 360 mL PEEP:  [5 cmH20] 5 cmH20 Pressure Support:  [10 cmH20] 10 cmH20 Plateau Pressure:  [13 cmH20-14 cmH20] 13 cmH20  INTAKE / OUTPUT: I/O last 3 completed shifts: In: 3165.7 [P.O.:25; I.V.:2980.7; Other:10; IV Piggyback:150] Out: 1696 [Urine:4145]  PHYSICAL EXAMINATION: General: thin adult female lying in bed HEENT: MM pink/moist, no jvd PSY: tearful at times Neuro: Awake, alert,  follows commands CV: s1s2 rrr, no m/r/g PULM: even/non-labored, lungs bilaterally clear  VE:LFYB, non-tender, bsx4 active  Extremities: warm/dry, bilateral upper extremity edema 1+, trace BLE edema  Skin: no rashes or lesions  LABS:  BMET Recent Labs  Lab 09/28/17 0745 09/28/17 1402 09/29/17 0013 09/30/17 0407  NA 135  --  132* 137  K 4.0  --  4.1 4.2  CL 102  --  107 108  CO2 24  --  17* 22  BUN 13  --  15 10  CREATININE 0.76 0.59 0.71 0.60  GLUCOSE 128*  --  151* 106*    Electrolytes Recent Labs  Lab 09/28/17 0745 09/29/17 0013 09/29/17 1222 09/30/17 0407  CALCIUM 9.1 7.1*  --  8.1*  MG 1.8 1.3*  --   --   PHOS  --   --  2.9  --     CBC Recent Labs  Lab 09/28/17 1402 09/29/17 0013 09/30/17 0407  WBC 4.2 3.7* 6.3  HGB 11.3* 11.0* 11.0*  HCT 33.4* 32.7* 32.0*  PLT 147* 124* 150    Coag's Recent Labs  Lab 09/29/17 0013 09/30/17 0407  APTT 51*  --   INR 1.52 1.09    Sepsis Markers Recent Labs  Lab 09/28/17 0802 09/28/17 0946 09/29/17 0013 09/30/17 0407  LATICACIDVEN 3.06* 2.77* 1.7  --   PROCALCITON  --   --  21.74 13.86    ABG Recent Labs  Lab 09/28/17 1422 09/29/17 0355  PHART 7.331* 7.382  PCO2ART 35.0 34.9  PO2ART 454* 179*    Liver Enzymes Recent Labs  Lab 09/28/17 0745 09/29/17 0013 09/30/17 0407  AST 315* 646* 235*  ALT 253* 526* 364*  ALKPHOS 176* 135* 132*  BILITOT 1.4* 1.8* 0.9  ALBUMIN 3.9 2.2* 2.4*    Cardiac Enzymes No results for input(s): TROPONINI, PROBNP in the last 168 hours.  Glucose Recent Labs  Lab 09/29/17 0815 09/29/17 1307 09/29/17 1541 09/29/17 1955 09/29/17 2338 09/30/17 0346  GLUCAP 110* 106* 118* 139* 115* 110*    Imaging Mr Brain Wo Contrast  Result Date: 09/29/2017 CLINICAL DATA:  Diffuse body pain and numbness. Fever and tachycardia. EXAM: MRI HEAD WITHOUT CONTRAST TECHNIQUE: Multiplanar, multiecho pulse sequences of the brain and surrounding structures were obtained without  intravenous contrast. COMPARISON:  Head CT 09/28/2017 FINDINGS: Brain: There is no evidence of acute infarct, intracranial hemorrhage, mass, midline shift, or extra-axial fluid collection. The ventricles and sulci are normal. The brain is normal in signal. A partially empty sella is noted. Vascular: Major intracranial vascular flow voids are preserved. Skull and upper cervical spine: Unremarkable bone marrow signal. Sinuses/Orbits: Unremarkable orbits. Moderate, relatively diffuse paranasal sinus mucosal thickening. Left maxillary sinus mucous retention cysts. Small bilateral mastoid effusions. Other: None. IMPRESSION: 1. No acute intracranial abnormality. 2. Partially empty sella, otherwise unremarkable appearance of the brain. Electronically Signed   By: Logan Bores M.D.   On: 09/29/2017 12:21   Dg Chest Port 1 View  Result Date: 09/30/2017 CLINICAL DATA:  Respiratory failure EXAM: PORTABLE CHEST 1 VIEW COMPARISON:  09/29/2017 FINDINGS: Interval extubation.  Removal enteric tube. Lungs are essentially clear. Possible trace right pleural effusion. No pneumothorax. The heart is normal in size. IMPRESSION: Interval extubation. Possible trace right pleural effusion. Electronically Signed   By: Julian Hy M.D.   On: 09/30/2017 07:18     STUDIES:  CT Head 2/2 >> no acute abnormality  CTA Chest 2/2 >> negative for PE, moderate bilateral axillary LAN, L>R  CT ABD/Pelvis 2/2 >> focal gallbladder wall thickening of the apex of the gallbladder HIDA 2/4 >>   CULTURES: RVP 2/3 >> negative  Strep Antigen 2/3 >> negative   ANTIBIOTICS: Rocephin 2/2 >> 2/4 Vancomycin 2/2 >> 2/4  SIGNIFICANT EVENTS: 2/02  Admit with headache, AMS  LINES/TUBES: ETT 2/2 >> 2/3 OGT 2/2 >> 2/3   DISCUSSION: 52 year old Asian female relatively previously healthy with rheumatoid arthritis (not on therapy due to finances) & migraines admitted with sudden onset of headache and sepsis 2/2 with altered mental status,  concern for inability to protect her airway and need for imaging.    ASSESSMENT / PLAN:  PULMONARY A: S/P Intubation - in setting of respiratory distress P:   Pulmonary hygiene - IS, mobilize  CARDIOVASCULAR A: No acute Issues P:  Transfer to floor   RENAL A:   At Risk AKI  P:   KVO IVF  Trend BMP / urinary output Replace electrolytes as indicated Avoid nephrotoxic agents, ensure adequate renal perfusion  GASTROINTESTINAL A: PPI coverage Elevated LFT's - acetaminophen level negative, Hep A, B, C negative P: Trend LFT's  Diet as tolerated   HEMATOLOGIC A: Thrombocytopenia - resolved P: Monitor fever curve / WBC trend   INFECTIOUS A: SIRS / Possible Sepsis - of unknown etiology CT scan ruled out with LP transferred to [facility].  ?? If RA was contributing factor to  elevated PCT's.  P: Discontinue abx  Monitor cultures   ENDOCRINE A:   No acute process  P:   Monitor glucose on BMP   NEUROLOGIC / ORTHO  A:   Altered Mental Status - resolved  Bilateral Swelling of Hands - suspect RA contributing, untreated  P:   Supportive care Assess bilateral hand xray Assess Sed rate, CRP, RF  FAMILY  - Updates: Patient updated at bedside on plan of care.  Doubt / no evidence of infectious process.  Suspect she has unmanaged RA with acute pain on presentation.  This is superimposed on emotional stress of job loss / anxiety.  Intubated for tachypnea > extubated quickly.  She has had an extensive work up which has been negative.  HIDA scan results pending.  She will need outpatient follow up with her primary / or to establish care with the [facility] to ensure she can see Rheumatology.   Transfer to floor, TRH as of 2/5.     Noe Gens, NP-C [facility] Pgr: [phone] or if no answer [phone] 09/30/2017, 8:31 AM   Attending:  I have seen and examined the patient with nurse practitioner/resident and agree with the note above.  We  formulated the plan together and I elicited the following history.    Subjective: Patient is s/p extubation yesterday. She is awake, alert and oriented.  Objective: Vitals:   09/30/17 0800 09/30/17 0900 09/30/17 1000 09/30/17 1200  BP: 121/86 106/85 134/88   Pulse: 99 91 91   Resp: 18 10 15    Temp: 98.2 F (36.8 C)   97.6 F (36.4 C)  TempSrc:    Oral  SpO2: 99% 96% 98%   Weight:      Height:       Vent Mode: PSV;CPAP FiO2 (%):  [40 %] 40 % PEEP:  [5 cmH20] 5 cmH20 Pressure Support:  [10 cmH20] 10 cmH20 Plateau Pressure:  [13 cmH20] 13 cmH20  Intake/Output Summary (Last 24 hours) at 09/30/2017 1535 Last data filed at 09/30/2017 1048 Gross per 24 hour  Intake 734.26 ml  Output 2625 ml  Net -1890.74 ml    General: thin adult female lying in bed HEENT: MM pink/moist, no jvd PSY: tearful at times Neuro: Awake, alert, follows commands CV: s1s2 rrr, no m/r/g PULM: even/non-labored, lungs bilaterally clear  PZ:WCHE, non-tender, bsx4 active  Extremities: warm/dry, bilateral upper extremity edema 1+, trace BLE edema. Swelling of the dorsum of both hands. Normal capillary refill of the fingers. +2 radial pulses. Skin: macules, possible papules at the base of the thumbs.     CBC    Component Value Date/Time   WBC 6.3 09/30/2017 0407   RBC 3.53 (L) 09/30/2017 0407   HGB 11.0 (L) 09/30/2017 0407   HCT 32.0 (L) 09/30/2017 0407   PLT 150 09/30/2017 0407   MCV 90.7 09/30/2017 0407   MCV 94.2 02/02/2013 1518   MCH 31.2 09/30/2017 0407   MCHC 34.4 09/30/2017 0407   RDW 15.9 (H) 09/30/2017 0407   LYMPHSABS 0.3 (L) 09/29/2017 0013   MONOABS 0.3 09/29/2017 0013   EOSABS 0.0 09/29/2017 0013   BASOSABS 0.0 09/29/2017 0013    BMET    Component Value Date/Time   NA 137 09/30/2017 0407   K 4.2 09/30/2017 0407   CL 108 09/30/2017 0407   CO2 22 09/30/2017 0407   GLUCOSE 106 (H) 09/30/2017 0407   BUN 10 09/30/2017 0407   CREATININE 0.60 09/30/2017 0407   CREATININE 0.65  02/02/2013 1516   CALCIUM 8.1 (L) 09/30/2017 0407   GFRNONAA >60 09/30/2017 0407   GFRAA >60 09/30/2017 0407     Impression/Plan: 1. Respiratory failure; resolved. 2. RA with probable synovitis. Obtain hand xrays 3. No source of sepsis. Antibiotics stopped. 4. Needs outpatient rheumatology evaluation for DMARD therapy.  My cc time 35 minutes  Jesus Genera, MD [facility] Pager: [phone]

## 2017-10-01 ENCOUNTER — Inpatient Hospital Stay (HOSPITAL_COMMUNITY): Payer: BLUE CROSS/BLUE SHIELD

## 2017-10-01 DIAGNOSIS — M791 Myalgia, unspecified site: Secondary | ICD-10-CM

## 2017-10-01 DIAGNOSIS — G9341 Metabolic encephalopathy: Secondary | ICD-10-CM | POA: Diagnosis present

## 2017-10-01 DIAGNOSIS — M130 Polyarthritis, unspecified: Secondary | ICD-10-CM

## 2017-10-01 DIAGNOSIS — R748 Abnormal levels of other serum enzymes: Secondary | ICD-10-CM

## 2017-10-01 DIAGNOSIS — R202 Paresthesia of skin: Secondary | ICD-10-CM

## 2017-10-01 DIAGNOSIS — K828 Other specified diseases of gallbladder: Secondary | ICD-10-CM

## 2017-10-01 DIAGNOSIS — J9601 Acute respiratory failure with hypoxia: Secondary | ICD-10-CM

## 2017-10-01 DIAGNOSIS — E876 Hypokalemia: Secondary | ICD-10-CM

## 2017-10-01 LAB — CBC
HCT: 31.6 % — ABNORMAL LOW (ref 36.0–46.0)
Hemoglobin: 10.6 g/dL — ABNORMAL LOW (ref 12.0–15.0)
MCH: 31 pg (ref 26.0–34.0)
MCHC: 33.5 g/dL (ref 30.0–36.0)
MCV: 92.4 fL (ref 78.0–100.0)
Platelets: 143 10*3/uL — ABNORMAL LOW (ref 150–400)
RBC: 3.42 MIL/uL — ABNORMAL LOW (ref 3.87–5.11)
RDW: 15.9 % — ABNORMAL HIGH (ref 11.5–15.5)
WBC: 4.5 10*3/uL (ref 4.0–10.5)

## 2017-10-01 LAB — CBC WITH DIFFERENTIAL/PLATELET
Basophils Absolute: 0 10*3/uL (ref 0.0–0.1)
Basophils Relative: 1 %
Eosinophils Absolute: 0.1 10*3/uL (ref 0.0–0.7)
Eosinophils Relative: 3 %
HCT: 32.8 % — ABNORMAL LOW (ref 36.0–46.0)
Hemoglobin: 11 g/dL — ABNORMAL LOW (ref 12.0–15.0)
Lymphocytes Relative: 49 %
Lymphs Abs: 2.2 10*3/uL (ref 0.7–4.0)
MCH: 30.6 pg (ref 26.0–34.0)
MCHC: 33.5 g/dL (ref 30.0–36.0)
MCV: 91.1 fL (ref 78.0–100.0)
Monocytes Absolute: 0.4 10*3/uL (ref 0.1–1.0)
Monocytes Relative: 10 %
Neutro Abs: 1.6 10*3/uL — ABNORMAL LOW (ref 1.7–7.7)
Neutrophils Relative %: 37 %
Platelets: 140 10*3/uL — ABNORMAL LOW (ref 150–400)
RBC: 3.6 MIL/uL — ABNORMAL LOW (ref 3.87–5.11)
RDW: 15.3 % (ref 11.5–15.5)
WBC: 4.4 10*3/uL (ref 4.0–10.5)

## 2017-10-01 LAB — CULTURE, RESPIRATORY W GRAM STAIN

## 2017-10-01 LAB — BASIC METABOLIC PANEL
Anion gap: 11 (ref 5–15)
BUN: 11 mg/dL (ref 6–20)
CO2: 22 mmol/L (ref 22–32)
Calcium: 8.4 mg/dL — ABNORMAL LOW (ref 8.9–10.3)
Chloride: 105 mmol/L (ref 101–111)
Creatinine, Ser: 0.62 mg/dL (ref 0.44–1.00)
GFR calc Af Amer: >60 mL/min (ref >60)
GFR calc non Af Amer: >60 mL/min (ref >60)
Glucose, Bld: 97 mg/dL (ref 65–99)
Potassium: 3.4 mmol/L — ABNORMAL LOW (ref 3.5–5.1)
Sodium: 138 mmol/L (ref 135–145)

## 2017-10-01 LAB — RHEUMATOID FACTOR: Rhuematoid fact SerPl-aCnc: 11.8 IU/mL (ref 0.0–13.9)

## 2017-10-01 LAB — CULTURE, RESPIRATORY: Culture: NO GROWTH

## 2017-10-01 MED ORDER — POTASSIUM CHLORIDE CRYS ER 20 MEQ PO TBCR
40.0000 meq | EXTENDED_RELEASE_TABLET | Freq: Once | ORAL | Status: AC
  Administered 2017-10-01: 40 meq via ORAL
  Filled 2017-10-01: qty 2

## 2017-10-01 MED ORDER — LIDOCAINE HCL (PF) 2 % IJ SOLN
0.0000 mL | Freq: Once | INTRAMUSCULAR | Status: DC | PRN
  Filled 2017-10-01: qty 20

## 2017-10-01 MED ORDER — ENOXAPARIN SODIUM 40 MG/0.4ML ~~LOC~~ SOLN
40.0000 mg | SUBCUTANEOUS | Status: DC
  Administered 2017-10-02 – 2017-10-03 (×2): 40 mg via SUBCUTANEOUS
  Filled 2017-10-01 (×2): qty 0.4

## 2017-10-01 NOTE — Evaluation (Signed)
Physical Therapy Evaluation Patient Details Name: Kathleen Costa MRN: [id number] DOB: 1966-01-13 Today's Date: 10/01/2017   History of Present Illness  Patient is a 52 y/o female who presents with sudden onset of headache and sepsis with AMS and inability to protect her airway so was intubated 2/2-2/3. Found to have Toxic/metabolic encephalopathy secondary to adverse Interaction of amitriptyline and Relpax. Abdominal CT scan-thickening of gallbladder wall. MR cervical spine-Multilevel disc protrusions and, small C2-3 disc extrusion. PMH includes RA and migraines.   Clinical Impression  Patient presents with generalized weakness, dizziness, sleepiness, pain in bil hands, impaired balance and impaired mobility s/p above. Tolerated gait training with Min A-Min guard for balance/safety. Pt limited by dizziness and just feeling tired. Son present in room. Pt independent PTA but plans to stay with daughter at d/c. Anticipate with increased mobility, strength will improve. Encouraged increasing activity level in hospital. Pt with edematous hands and tender to touch. Will follow acutely to maximize independence and mobility prior to return home.     Follow Up Recommendations No PT follow up;Supervision for mobility/OOB    Equipment Recommendations  None recommended by PT    Recommendations for Other Services       Precautions / Restrictions Precautions Precautions: Fall Precaution Comments: dizziness- reported as chronic Restrictions Weight Bearing Restrictions: No      Mobility  Bed Mobility Overal bed mobility: Modified Independent             General bed mobility comments: Increased time but no assist needed. + dizziness.  Reports this happens sometimes.   Transfers Overall transfer level: Needs assistance Equipment used: None Transfers: Sit to/from Stand Sit to Stand: Min guard         General transfer comment: Min guard for safety. Stood from EOB x1. + dizziness.    Ambulation/Gait Ambulation/Gait assistance: Min assist;Min guard Ambulation Distance (Feet): 150 Feet Assistive device: None Gait Pattern/deviations: Step-through pattern;Decreased stride length;Staggering right;Staggering left;Drifts right/left;Narrow base of support;Scissoring Gait velocity: decreased Gait velocity interpretation: Below normal speed for age/gender General Gait Details: Slow, unsteady gait with some staggering noted with head turns and drifting but no overt LOB. Min A at times for stability. Narrow BoS. Pt reports feeling dizzy and sleepy.   Stairs            Wheelchair Mobility    Modified Rankin (Stroke Patients Only)       Balance Overall balance assessment: Needs assistance Sitting-balance support: No upper extremity supported Sitting balance-Leahy Scale: Good     Standing balance support: During functional activity Standing balance-Leahy Scale: Fair Standing balance comment: Close Min guard for dynamic standing due to instability/weakness.                              Pertinent Vitals/Pain Pain Assessment: 0-10 Pain Score: 6  Pain Location: left hand Pain Descriptors / Indicators: Sore;Tingling;Grimacing;Guarding;Throbbing;Tender Pain Intervention(s): Monitored during session;Repositioned    Home Living Family/patient expects to be discharged to:: Private residence Living Arrangements: Alone;Children(but can stay with daughter at d/c) Available Help at Discharge: Family;Available PRN/intermittently Type of Home: House Home Access: Level entry     Home Layout: One level Home Equipment: None      Prior Function Level of Independence: Independent         Comments: Reports she recently got laid off from job secondary to RA- emotional about it. Drives. Can stay with her daughter if needed.     Hand Dominance  Extremity/Trunk Assessment   Upper Extremity Assessment Upper Extremity Assessment: Defer to OT  evaluation;LUE deficits/detail;RUE deficits/detail RUE Deficits / Details: Edematous hand; decreased grip strength - hx of RA LUE Deficits / Details: Edematous hand; decreased grip strength- hx of RA LUE Sensation: decreased light touch    Lower Extremity Assessment Lower Extremity Assessment: Generalized weakness    Cervical / Trunk Assessment Cervical / Trunk Assessment: Normal  Communication   Communication: No difficulties  Cognition Arousal/Alertness: (Sleepy) Behavior During Therapy: WFL for tasks assessed/performed Overall Cognitive Status: Within Functional Limits for tasks assessed                                 General Comments: for basic mobility tasks. Distracted by pain.      General Comments General comments (skin integrity, edema, etc.): Son present during session.     Exercises     Assessment/Plan    PT Assessment Patient needs continued PT services  PT Problem List Decreased strength;Decreased mobility;Pain;Impaired sensation;Decreased balance       PT Treatment Interventions Balance training;Patient/family education;Gait training;Therapeutic activities;Therapeutic exercise    PT Goals (Current goals can be found in the Care Plan section)  Acute Rehab PT Goals Patient Stated Goal: to go home PT Goal Formulation: With patient Time For Goal Achievement: 10/15/17 Potential to Achieve Goals: Good    Frequency Min 3X/week   Barriers to discharge        Co-evaluation               AM-PAC PT "6 Clicks" Daily Activity  Outcome Measure Difficulty turning over in bed (including adjusting bedclothes, sheets and blankets)?: None Difficulty moving from lying on back to sitting on the side of the bed? : None Difficulty sitting down on and standing up from a chair with arms (e.g., wheelchair, bedside commode, etc,.)?: None Help needed moving to and from a bed to chair (including a wheelchair)?: A Little Help needed walking in hospital  room?: A Little Help needed climbing 3-5 steps with a railing? : A Little 6 Click Score: 21    End of Session Equipment Utilized During Treatment: Gait belt Activity Tolerance: Patient limited by lethargy;Patient tolerated treatment well Patient left: in bed;with call bell/phone within reach;with family/visitor present Nurse Communication: Mobility status PT Visit Diagnosis: Muscle weakness (generalized) (M62.81);Unsteadiness on feet (R26.81);Pain Pain - Right/Left: (bilateral) Pain - part of body: Hand    Time: 1111-1135 PT Time Calculation (min) (ACUTE ONLY): 24 min   Charges:   PT Evaluation $PT Eval Low Complexity: 1 Low PT Treatments $Gait Training: 8-22 mins   PT G Codes:        Wray Kearns, PT, DPT [phone]    Marguarite Arbour A Hartshorne 10/01/2017, 11:52 AM

## 2017-10-01 NOTE — Consult Note (Signed)
[facility] Consult/Admission Note  Kathleen Costa 06-22-1966  [id number].    Requesting MD: Dr. Maylene Roes Chief Complaint/Reason for Consult: Skin biopsy  HPI:   Pt is a 52 yo [place] female with hx of rheumatoid arthritis (dx 2 years ago, not currently on treatment), migraine, who presented to the ED with complaints of b/l leg pain and numbness. CT head was negative. Some concern for gallbladder disease on CT abd but HIDA was negative. Pt developed a rash on her b/l wrist rash with b/l hand swelling. She states it started 3 days ago. The rash is not pruritic and is not painful. We were consulted for a skin biopsy. Pt denies associated symptoms. No alleviating or aggravating factors.   ROS:  Review of Systems  Constitutional: Negative for chills, diaphoresis and fever.  HENT: Negative for sore throat.   Respiratory: Negative for cough and shortness of breath.   Cardiovascular: Negative for chest pain.  Gastrointestinal: Negative for abdominal pain, blood in stool, constipation, diarrhea, nausea and vomiting.  Genitourinary: Negative for dysuria.  Musculoskeletal: Positive for joint pain (b/l leg pain has resolved. ).       + for edema to b/l hands  Skin: Positive for rash (b/l wrists).  Neurological: Positive for focal weakness (has resolved since admission). Negative for dizziness and loss of consciousness.  All other systems reviewed and are negative.    Family History  Problem Relation Age of Onset  . Migraines Mother   . Migraines Sister   . Dementia Brother   . Migraines Brother   . Migraines Brother   . Cancer - Lung Brother     Past Medical History:  Diagnosis Date  . Arthritis   . Disturbance of skin sensation   . Headache   . Migraine without aura, without mention of intractable migraine without mention of status migrainosus 04/22/2014  . Rheumatoid arthritis ([place])     History reviewed. No pertinent surgical history.  Social History:  reports  that  has never smoked. she has never used smokeless tobacco. She reports that she does not drink alcohol or use drugs.  Allergies: No Known Allergies  Medications Prior to Admission  Medication Sig Dispense Refill  . amitriptyline (ELAVIL) 25 MG tablet Take 1 tablet (25 mg total) by mouth at bedtime. 30 tablet 4  . aspirin-acetaminophen-caffeine (EXCEDRIN MIGRAINE) 250-250-65 MG tablet Take 2 tablets by mouth every 6 (six) hours as needed for headache.    . Chlorphen-PE-Acetaminophen (NOREL AD) 4-10-325 MG TABS Take 1 tablet by mouth daily as needed (headache).    . eletriptan (RELPAX) 40 MG tablet Take 1 tablet (40 mg total) by mouth 2 (two) times daily as needed. (Patient taking differently: Take 40 mg by mouth 2 (two) times daily as needed for migraine. ) 10 tablet 5  . erythromycin ophthalmic ointment Place a 1/2 inch ribbon of ointment into the lower eyelid of the left eye for 5 days 2 x a day. 3.5 g 0  . ibuprofen (ADVIL,MOTRIN) 200 MG tablet Take 400 mg by mouth every 6 (six) hours as needed for mild pain.    . predniSONE (STERAPRED UNI-PAK 21 TAB) 10 MG (21) TBPK tablet Take 20 mg by mouth daily.  0  . SUMAtriptan (IMITREX) 50 MG tablet Take 50 mg by mouth every 2 (two) hours as needed for migraine. May repeat in 2 hours if headache persists or recurs.    . methocarbamol (ROBAXIN) 500 MG tablet Take 1 tablet (500 mg total) by mouth  2 (two) times daily. (Patient not taking: Reported on 09/28/2017) 20 tablet 0  . naproxen (NAPROSYN) 500 MG tablet Take 1 tablet (500 mg total) by mouth 2 (two) times daily. (Patient not taking: Reported on 09/28/2017) 30 tablet 0  . predniSONE (DELTASONE) 5 MG tablet Begin taking 6 tablets daily, taper by one tablet daily until off the medication. (Patient not taking: Reported on 09/28/2017) 21 tablet 0  . SUMAtriptan (IMITREX) 100 MG tablet Take 1 tablet (100 mg total) by mouth 2 (two) times daily as needed for migraine. (Patient not taking: Reported on 09/28/2017) 10  tablet 5  . terbinafine (LAMISIL) 250 MG tablet Take 1 tablet (250 mg total) by mouth daily. (Patient not taking: Reported on 09/28/2017) 90 tablet 0  . valACYclovir (VALTREX) 1000 MG tablet Take 2 tabs PO every 12 hours, then stop. (Patient not taking: Reported on 09/28/2017) 4 tablet 0  . valACYclovir (VALTREX) 1000 MG tablet Take 1 tablet (1,000 mg total) by mouth 3 (three) times daily. For 10 days (Patient not taking: Reported on 09/28/2017) 30 tablet 0    Blood pressure 123/70, pulse 85, temperature (!) 97.4 F (36.3 C), temperature source Oral, resp. rate 18, height 5' (1.524 m), weight 112 lb 7 oz (51 kg), SpO2 100 %.  Physical Exam  Constitutional: She is oriented to person, place, and time and well-developed, well-nourished, and in no distress. Vital signs are normal. No distress.  HENT:  Head: Normocephalic and atraumatic.  Nose: Nose normal.  Mouth/Throat: Oropharynx is clear and moist. No oropharyngeal exudate.  Eyes: Conjunctivae are normal. Pupils are equal, round, and reactive to light. Right eye exhibits no discharge. Left eye exhibits no discharge. No scleral icterus.  Neck: Normal range of motion. Neck supple. No thyromegaly present.  Cardiovascular: Normal rate, regular rhythm, normal heart sounds and intact distal pulses. Exam reveals no gallop and no friction rub.  No murmur heard. Pulses:      Radial pulses are 2+ on the right side, and 2+ on the left side.       Dorsalis pedis pulses are 2+ on the right side, and 2+ on the left side.  Pulmonary/Chest: Effort normal and breath sounds normal. No respiratory distress. She has no decreased breath sounds. She has no wheezes. She has no rhonchi. She has no rales.  Abdominal: Soft. Bowel sounds are normal. She exhibits no distension and no mass. There is no tenderness. There is no rebound and no guarding.  Musculoskeletal: Normal range of motion. She exhibits edema (b/l hands). She exhibits no deformity.  Neurological: She is alert  and oriented to person, place, and time.  Skin: Skin is warm and dry. Rash noted. She is not diaphoretic.  Small purpuric macular irregular pachy lesions noted to b/l wrists, no surrounding erythema  Psychiatric: Mood and affect normal.  Nursing note and vitals reviewed.   Results for orders placed or performed during the hospital encounter of 09/28/17 (from the past 48 hour(s))  Glucose, capillary     Status: Abnormal   Collection Time: 09/29/17  3:41 PM  Result Value Ref Range   Glucose-Capillary 118 (H) 65 - 99 mg/dL  Glucose, capillary     Status: Abnormal   Collection Time: 09/29/17  7:55 PM  Result Value Ref Range   Glucose-Capillary 139 (H) 65 - 99 mg/dL  Glucose, capillary     Status: Abnormal   Collection Time: 09/29/17 11:38 PM  Result Value Ref Range   Glucose-Capillary 115 (H) 65 - 99  mg/dL  Glucose, capillary     Status: Abnormal   Collection Time: 09/30/17  3:46 AM  Result Value Ref Range   Glucose-Capillary 110 (H) 65 - 99 mg/dL  Procalcitonin     Status: None   Collection Time: 09/30/17  4:07 AM  Result Value Ref Range   Procalcitonin 13.86 ng/mL    Comment:        Interpretation: PCT >= 10 ng/mL: Important systemic inflammatory response, almost exclusively due to severe bacterial sepsis or septic shock. (NOTE)       Sepsis PCT Algorithm           Lower Respiratory Tract                                      Infection PCT Algorithm    ----------------------------     ----------------------------         PCT < 0.25 ng/mL                PCT < 0.10 ng/mL         Strongly encourage             Strongly discourage   discontinuation of antibiotics    initiation of antibiotics    ----------------------------     -----------------------------       PCT 0.25 - 0.50 ng/mL            PCT 0.10 - 0.25 ng/mL               OR       >80% decrease in PCT            Discourage initiation of                                            antibiotics      Encourage  discontinuation           of antibiotics    ----------------------------     -----------------------------         PCT >= 0.50 ng/mL              PCT 0.26 - 0.50 ng/mL                AND       <80% decrease in PCT             Encourage initiation of                                             antibiotics       Encourage continuation           of antibiotics    ----------------------------     -----------------------------        PCT >= 0.50 ng/mL                  PCT > 0.50 ng/mL               AND         increase in PCT                  Strongly encourage  initiation of antibiotics    Strongly encourage escalation           of antibiotics                                     -----------------------------                                           PCT <= 0.25 ng/mL                                                 OR                                        > 80% decrease in PCT                                     Discontinue / Do not initiate                                             antibiotics Performed at [facility], [address], [place], [place] [address]   Basic metabolic panel     Status: Abnormal   Collection Time: 09/30/17  4:07 AM  Result Value Ref Range   Sodium 137 135 - 145 mmol/L   Potassium 4.2 3.5 - 5.1 mmol/L   Chloride 108 101 - 111 mmol/L   CO2 22 22 - 32 mmol/L   Glucose, Bld 106 (H) 65 - 99 mg/dL   BUN 10 6 - 20 mg/dL   Creatinine, Ser 0.60 0.44 - 1.00 mg/dL   Calcium 8.1 (L) 8.9 - 10.3 mg/dL   GFR calc non Af Amer >60 >60 mL/min   GFR calc Af Amer >60 >60 mL/min    Comment: (NOTE) The eGFR has been calculated using the CKD EPI equation. This calculation has not been validated in all clinical situations. eGFR's persistently <60 mL/min signify possible Chronic Kidney Disease.    Anion gap 7 5 - 15    Comment: Performed at [facility] [address] [place], [place] [address]  CBC     Status:  Abnormal   Collection Time: 09/30/17  4:07 AM  Result Value Ref Range   WBC 6.3 4.0 - 10.5 K/uL   RBC 3.53 (L) 3.87 - 5.11 MIL/uL   Hemoglobin 11.0 (L) 12.0 - 15.0 g/dL   HCT 32.0 (L) 36.0 - 46.0 %   MCV 90.7 78.0 - 100.0 fL   MCH 31.2 26.0 - 34.0 pg   MCHC 34.4 30.0 - 36.0 g/dL   RDW 15.9 (H) 11.5 - 15.5 %   Platelets 150 150 - 400 K/uL    Comment: Performed at [facility], [place] [address], [place], [place] [address]  CK     Status: Abnormal   Collection Time: 09/30/17  4:07 AM  Result  Value Ref Range   Total CK 391 (H) 38 - 234 U/L    Comment: Performed at [facility], [place] [address] [place], [place] [address]  Hepatic function panel     Status: Abnormal   Collection Time: 09/30/17  4:07 AM  Result Value Ref Range   Total Protein 5.6 (L) 6.5 - 8.1 g/dL   Albumin 2.4 (L) 3.5 - 5.0 g/dL   AST 235 (H) 15 - 41 U/L   ALT 364 (H) 14 - 54 U/L   Alkaline Phosphatase 132 (H) 38 - 126 U/L   Total Bilirubin 0.9 0.3 - 1.2 mg/dL   Bilirubin, Direct 0.2 0.1 - 0.5 mg/dL   Indirect Bilirubin 0.7 0.3 - 0.9 mg/dL    Comment: Performed at [facility] [address] [place], [place] [address]  Protime-INR     Status: None   Collection Time: 09/30/17  4:07 AM  Result Value Ref Range   Prothrombin Time 14.0 11.4 - 15.2 seconds   INR 1.09     Comment: Performed at [facility] [address], [place], [place] [address]  Glucose, capillary     Status: Abnormal   Collection Time: 09/30/17  7:46 AM  Result Value Ref Range   Glucose-Capillary 116 (H) 65 - 99 mg/dL   Comment 1 Notify RN    Comment 2 Document in Chart   Glucose, capillary     Status: Abnormal   Collection Time: 09/30/17 11:25 AM  Result Value Ref Range   Glucose-Capillary 156 (H) 65 - 99 mg/dL  Sedimentation rate     Status: Abnormal   Collection Time: 09/30/17 11:30 AM  Result Value Ref Range   Sed Rate 45 (H) 0 - 22 mm/hr    Comment: Performed at [facility], [place] [address],  [place], [place] [address]  C-reactive protein     Status: Abnormal   Collection Time: 09/30/17 11:30 AM  Result Value Ref Range   CRP 9.0 (H) <1.0 mg/dL    Comment: Performed at [facility] [address] [place],  [address]  Rheumatoid factor     Status: None   Collection Time: 09/30/17 11:30 AM  Result Value Ref Range   Rhuematoid fact SerPl-aCnc 11.8 0.0 - 13.9 IU/mL    Comment: (NOTE) Performed At: [facility] [place], [place] [id number] Rush Farmer MD UD:[id number] Performed at [facility], [place] [address] [place], [place] [address]   Glucose, capillary     Status: None   Collection Time: 09/30/17  3:37 PM  Result Value Ref Range   Glucose-Capillary 96 65 - 99 mg/dL   Comment 1 Notify RN    Comment 2 Document in Chart   Glucose, capillary     Status: None   Collection Time: 09/30/17  8:03 PM  Result Value Ref Range   Glucose-Capillary 86 65 - 99 mg/dL   Comment 1 Notify RN    Comment 2 Document in Chart   CBC     Status: Abnormal   Collection Time: 10/01/17  4:51 AM  Result Value Ref Range   WBC 4.5 4.0 - 10.5 K/uL   RBC 3.42 (L) 3.87 - 5.11 MIL/uL   Hemoglobin 10.6 (L) 12.0 - 15.0 g/dL   HCT 31.6 (L) 36.0 - 46.0 %   MCV 92.4 78.0 - 100.0 fL   MCH 31.0 26.0 - 34.0 pg   MCHC 33.5 30.0 - 36.0  g/dL   RDW 15.9 (H) 11.5 - 15.5 %   Platelets 143 (L) 150 - 400 K/uL    Comment: Performed at [facility], [place] [address] [place], [place] [address]  Basic metabolic panel     Status: Abnormal   Collection Time: 10/01/17  4:51 AM  Result Value Ref Range   Sodium 138 135 - 145 mmol/L   Potassium 3.4 (L) 3.5 - 5.1 mmol/L   Chloride 105 101 - 111 mmol/L   CO2 22 22 - 32 mmol/L   Glucose, Bld 97 65 - 99 mg/dL   BUN 11 6 - 20 mg/dL   Creatinine, Ser 0.62 0.44 - 1.00 mg/dL   Calcium 8.4 (L) 8.9 - 10.3 mg/dL   GFR calc non Af Amer >60 >60 mL/min   GFR calc Af Amer >60 >60 mL/min    Comment: (NOTE) The eGFR has been  calculated using the CKD EPI equation. This calculation has not been validated in all clinical situations. eGFR's persistently <60 mL/min signify possible Chronic Kidney Disease.    Anion gap 11 5 - 15    Comment: Performed at [facility] [address], [place], [place] [address]  CBC with Differential/Platelet     Status: Abnormal   Collection Time: 10/01/17 12:15 PM  Result Value Ref Range   WBC 4.4 4.0 - 10.5 K/uL   RBC 3.60 (L) 3.87 - 5.11 MIL/uL   Hemoglobin 11.0 (L) 12.0 - 15.0 g/dL   HCT 32.8 (L) 36.0 - 46.0 %   MCV 91.1 78.0 - 100.0 fL   MCH 30.6 26.0 - 34.0 pg   MCHC 33.5 30.0 - 36.0 g/dL   RDW 15.3 11.5 - 15.5 %   Platelets 140 (L) 150 - 400 K/uL   Neutrophils Relative % 37 %   Neutro Abs 1.6 (L) 1.7 - 7.7 K/uL   Lymphocytes Relative 49 %   Lymphs Abs 2.2 0.7 - 4.0 K/uL   Monocytes Relative 10 %   Monocytes Absolute 0.4 0.1 - 1.0 K/uL   Eosinophils Relative 3 %   Eosinophils Absolute 0.1 0.0 - 0.7 K/uL   Basophils Relative 1 %   Basophils Absolute 0.0 0.0 - 0.1 K/uL    Comment: Performed at [facility] [address] [place], [place] [address]   Mr Cervical Spine Wo Contrast  Result Date: 10/01/2017 CLINICAL DATA:  Diffuse pain, subjective numbness and weakness. Evaluate inflammatory arthropathy. EXAM: MRI CERVICAL SPINE WITHOUT CONTRAST TECHNIQUE: Multiplanar, multisequence MR imaging of the cervical spine was performed. No intravenous contrast was administered. COMPARISON:  MRI of the head September 29, 2017 FINDINGS: ALIGNMENT: Straightened cervical lordosis.  No malalignment. VERTEBRAE/DISCS: Vertebral bodies are intact. Intervertebral disc morphology's and signal are normal. Multilevel mild chronic discogenic endplate changes without acute or abnormal bone marrow signal. No basilar invagination. CORD:Cervical spinal cord is normal morphology and signal characteristics from the cervicomedullary junction to level of T2-3, the most caudal well visualized  level. POSTERIOR FOSSA, VERTEBRAL ARTERIES, PARASPINAL TISSUES: No MR findings of ligamentous injury. Vertebral artery flow voids present. Partially empty sella. DISC LEVELS (mildly motion degraded axial sequences): C2-3: Small central disc extrusion with approximately 6 mm superior and inferior migration. Extrusion contacts but does not deform the central spinal cord. Moderate LEFT facet arthropathy. No canal stenosis or neural foraminal narrowing. C4-5: Tiny central disc protrusion. Mild to moderate LEFT facet arthropathy. No canal stenosis or neural foraminal narrowing. C4-5: Small central disc protrusion. Mild LEFT facet  arthropathy. No canal stenosis or neural foraminal narrowing. C5-6: Small central disc protrusion. No canal stenosis or neural foraminal narrowing. C6-7: Small to moderate RIGHT central disc protrusion does not contact the spinal cord. No canal stenosis or neural foraminal narrowing. C7-T1: No disc bulge, canal stenosis nor neural foraminal narrowing. IMPRESSION: 1. No fracture, malalignment or acute osseous process. 2. Multilevel disc protrusions and, small C2-3 disc extrusion. 3. No canal stenosis or neural foraminal narrowing any level. Electronically Signed   By: Elon Alas M.D.   On: 10/01/2017 01:23   Nm Hepatobiliary Liver Func  Result Date: 09/30/2017 CLINICAL DATA:  Abdominal pain, possible cholecystitis on CT and ultrasound. EXAM: NUCLEAR MEDICINE HEPATOBILIARY IMAGING TECHNIQUE: Sequential images of the abdomen were obtained out to 60 minutes following intravenous administration of radiopharmaceutical. RADIOPHARMACEUTICALS:  5.2 mCi Tc-6m Choletec IV COMPARISON:  Abdominal ultrasound of September 28, 2017 FINDINGS: Prompt uptake and biliary excretion of activity by the liver is seen. Gallbladder activity is visualized, consistent with patency of cystic duct. Biliary activity passes into small bowel, consistent with patent common bile duct. IMPRESSION: Normal hepatobiliary  scan.  Patent cystic and common bile ducts. Electronically Signed   By: David  JMartiniqueM.D.   On: 09/30/2017 12:13   Dg Hand 2 View Right  Result Date: 09/30/2017 CLINICAL DATA:  Rheumatoid arthritis. Bilateral hand swelling and pain. EXAM: RIGHT HAND - 2 VIEW COMPARISON:  None. FINDINGS: Marked soft tissue swelling throughout the hand and fingers. No underlying acute osseous abnormality. No osseous erosions or joint subluxations. IMPRESSION: Diffuse soft tissue swelling. No osseous findings of rheumatoid arthritis. No acute osseous abnormality. Electronically Signed   By: MLorin PicketM.D.   On: 09/30/2017 11:23   Dg Hand 2 View Left  Result Date: 09/30/2017 CLINICAL DATA:  Rheumatoid arthritis with hand swelling EXAM: LEFT HAND - 2 VIEW COMPARISON:  None. FINDINGS: Marked diffuse dorsal soft tissue swelling. No underlying bone erosions identified. Overall bone mineralization appears normal. The joint spaces appear relatively well preserved. IMPRESSION: 1. Marked dorsal soft tissue swelling. 2. No focal bone abnormalities. Electronically Signed   By: TKerby MoorsM.D.   On: 09/30/2017 11:23   Dg Chest Port 1 View  Result Date: 09/30/2017 CLINICAL DATA:  Respiratory failure EXAM: PORTABLE CHEST 1 VIEW COMPARISON:  09/29/2017 FINDINGS: Interval extubation.  Removal enteric tube. Lungs are essentially clear. Possible trace right pleural effusion. No pneumothorax. The heart is normal in size. IMPRESSION: Interval extubation. Possible trace right pleural effusion. Electronically Signed   By: SJulian HyM.D.   On: 09/30/2017 07:18      Assessment/Plan Principal Problem:   Acute metabolic encephalopathy Active Problems:   Common migraine with intractable migraine   Lymphadenopathy   Myalgia   Polyarthritis   Paresthesias   Acute hypoxemic respiratory failure (HCC)   Thickening of wall of gallbladder   Elevated liver enzymes   Hypokalemia  Rash b/l wrists - will perform skin biopsy  tomorrow AM  Thank you for the consult.   JKalman Drape [facility] 10/01/2017, 3:21 PM Pager: [phone]: [phone]-Fri 7:00 am-4:30 pm Sat-Sun 7:00 am-11:30 am

## 2017-10-01 NOTE — Progress Notes (Signed)
PROGRESS NOTE    Kathleen Costa  HBZ:[id number] DOB: 01/12/1966 DOA: 09/28/2017 PCP: Velna Hatchet, MD     Brief Narrative:  Kathleen Costa is a 52 yo [place] female with hx of rheumatoid arthritis (dx 2 years ago, not currently on treatment), migraine who was at home with her grandchildren when she started complaining of bilateral leg pains. She called her daughter and came to the ED for evaluation. In the ED, her pain progressed very quickly to her bilateral upper extremities. She also complained of arm and leg numbness. She had taken her amitriptyline and triptan for her chronic migraines prior to presentation. In the ED, CTA was completed which was negative for PE, CT abd/pelvis found gallbladder wall thickening. Throughout her evaluation in the ED, her respiratory status quickly deteriorated with increased work of breath and tachycardia. She was intubated in the ED and admitted to ICU. General surgery was also consulted due to concern for gallbladder wall thickening. She was subsequently extubated and transferred to [facility] service on 2/4.   Assessment & Plan:   Principal Problem:   Acute metabolic encephalopathy Active Problems:   Common migraine with intractable migraine   Lymphadenopathy   Myalgia   Polyarthritis   Paresthesias   Acute hypoxemic respiratory failure (HCC)   Thickening of wall of gallbladder   Elevated liver enzymes   Hypokalemia  Acute toxic/metabolic encephalopathy -CT head negative -MRI brain negative  -TSH, free T4 within normal limits  -Resolved, back to baseline   Diffuse myalgia, polyarthritis, paresthesia  -Hx rheumatoid arthritis, diagnosed by Dr. Amil Amen 2 years ago. Patient not currently on treatment  -Bilateral hand xrays with dorsal soft tissue swelling, no focal bone abnormalities  -MR cervical spine with multilevel disc protrusions, no fracture/malalignment/osseous process -Elevated CRP, ESR  -Rheumatoid factor negative  -Neurology  consulted. Spoke with Dr. Leonel Ramsay today. Her presentation not consistent with neurologic etiology. Can resume her migraine medications   -Asked general surgery for skin biopsy of lesions on her wrists. They are purpuritic, blistering and began on her left wrist, progressed to her right wrist. No skin lesions elsewhere. They will biopsy tomorrow  -?Autoimmune etiology. Check ANA, anti-CCP. Will definitely need rheumatology referral    Acute hypoxemic respiratory failure -Now extubated and stable on room air   Concern for sepsis on admission  -LP with unremarkable CSF, normal WBC, normal protein and clear supernatant, with RBCs in CSF being most consistent with bloody tap. Negative HSV. -Procalcitonin 21.74 --> 13.86. ?Could be secondary to autoimmune process  -Strep pneumo Ag negative -Respiratory viral panel, influenza negative  -HIV negative  -RPR negative  -Toxoplasma Ag negative  -Lactic acidosis improved  -UA negative  -No etiology of infection found, now off antibiotics   Gallbladder thickening -CT and RUQ positive for diffuse gallbladder wall thickening -HIDA negative, general surgery signed off  Elevated liver enzymes -Hepatitis panel negative  -Improved   Hypokalemia -Replace, trend    DVT prophylaxis: SCD Code Status: Full Family Communication: Husband and daughter at bedside Disposition Plan: Pending work up    Consultants:   PCCM admit  Neurology  General surgery   Antimicrobials:  Anti-infectives (From admission, onward)   Start     Dose/Rate Route Frequency Ordered Stop   09/29/17 1400  vancomycin (VANCOCIN) IVPB 1000 mg/200 mL premix  Status:  Discontinued     1,000 mg 200 mL/hr over 60 Minutes Intravenous Every 24 hours 09/28/17 1703 09/30/17 0849   09/28/17 1800  cefTRIAXone (ROCEPHIN) 1 g in dextrose 5 %  50 mL IVPB  Status:  Discontinued     1 g 100 mL/hr over 30 Minutes Intravenous Every 24 hours 09/28/17 1334 09/30/17 0849   09/28/17 1400   vancomycin (VANCOCIN) IVPB 1000 mg/200 mL premix     1,000 mg 200 mL/hr over 60 Minutes Intravenous  Once 09/28/17 1345 09/28/17 1537   09/28/17 1100  piperacillin-tazobactam (ZOSYN) IVPB 3.375 g     3.375 g 100 mL/hr over 30 Minutes Intravenous  Once 09/28/17 1048 09/28/17 1138       Subjective: Continues to have bilateral wrist and hand swelling, pain.   Objective: Vitals:   10/01/17 0410 10/01/17 0500 10/01/17 0800 10/01/17 0928  BP:   122/88 123/70  Pulse:    85  Resp:   (!) 21 18  Temp: 98.2 F (36.8 C)  97.8 F (36.6 C) (!) 97.4 F (36.3 C)  TempSrc: Oral  Oral Oral  SpO2:   99% 100%  Weight:  51 kg (112 lb 7 oz)    Height:        Intake/Output Summary (Last 24 hours) at 10/01/2017 1426 Last data filed at 09/30/2017 2000 Gross per 24 hour  Intake 440 ml  Output -  Net 440 ml   Filed Weights   09/29/17 0439 09/30/17 0754 10/01/17 0500  Weight: 53.3 kg (117 lb 8.1 oz) 51.2 kg (112 lb 14 oz) 51 kg (112 lb 7 oz)    Examination:  General exam: Appears calm and comfortable  Respiratory system: Clear to auscultation. Respiratory effort normal. Cardiovascular system: S1 & S2 heard, RRR. No JVD, murmurs, rubs, gallops or clicks. No pedal edema. Gastrointestinal system: Abdomen is nondistended, soft and nontender. No organomegaly or masses felt. Normal bowel sounds heard. Central nervous system: Alert and oriented. No focal neurological deficits. Extremities: Symmetric, bilateral hands with edema  Skin: +blistering and purpuric lesions along bilateral wrists  Psychiatry: Judgement and insight appear normal. Mood & affect appropriate.   Data Reviewed: I have personally reviewed following labs and imaging studies  CBC: Recent Labs  Lab 09/28/17 0745 09/28/17 1402 09/29/17 0013 09/30/17 0407 10/01/17 0451 10/01/17 1215  WBC 1.8* 4.2 3.7* 6.3 4.5 4.4  NEUTROABS 1.1*  --  3.1  --   --  1.6*  HGB 13.4 11.3* 11.0* 11.0* 10.6* 11.0*  HCT 39.2 33.4* 32.7* 32.0* 31.6*  32.8*  MCV 93.8 91.0 92.4 90.7 92.4 91.1  PLT 203 147* 124* 150 143* 086*   Basic Metabolic Panel: Recent Labs  Lab 09/28/17 0745 09/28/17 1402 09/29/17 0013 09/29/17 1222 09/30/17 0407 10/01/17 0451  NA 135  --  132*  --  137 138  K 4.0  --  4.1  --  4.2 3.4*  CL 102  --  107  --  108 105  CO2 24  --  17*  --  22 22  GLUCOSE 128*  --  151*  --  106* 97  BUN 13  --  15  --  10 11  CREATININE 0.76 0.59 0.71  --  0.60 0.62  CALCIUM 9.1  --  7.1*  --  8.1* 8.4*  MG 1.8  --  1.3*  --   --   --   PHOS  --   --   --  2.9  --   --    GFR: Estimated Creatinine Clearance: 58.4 mL/min (by C-G formula based on SCr of 0.62 mg/dL). Liver Function Tests: Recent Labs  Lab 09/28/17 0745 09/29/17 0013 09/30/17 0407  AST  315* 646* 235*  ALT 253* 526* 364*  ALKPHOS 176* 135* 132*  BILITOT 1.4* 1.8* 0.9  PROT 7.7 5.2* 5.6*  ALBUMIN 3.9 2.2* 2.4*   No results for input(s): LIPASE, AMYLASE in the last 168 hours. Recent Labs  Lab 09/29/17 1222  AMMONIA 51*   Coagulation Profile: Recent Labs  Lab 09/29/17 0013 09/30/17 0407  INR 1.52 1.09   Cardiac Enzymes: Recent Labs  Lab 09/28/17 0745 09/28/17 2011 09/30/17 0407  CKTOTAL 119 365* 391*   BNP (last 3 results) No results for input(s): PROBNP in the last 8760 hours. HbA1C: No results for input(s): HGBA1C in the last 72 hours. CBG: Recent Labs  Lab 09/30/17 0346 09/30/17 0746 09/30/17 1125 09/30/17 1537 09/30/17 2003  GLUCAP 110* 116* 156* 96 86   Lipid Profile: No results for input(s): CHOL, HDL, LDLCALC, TRIG, CHOLHDL, LDLDIRECT in the last 72 hours. Thyroid Function Tests: No results for input(s): TSH, T4TOTAL, FREET4, T3FREE, THYROIDAB in the last 72 hours. Anemia Panel: No results for input(s): VITAMINB12, FOLATE, FERRITIN, TIBC, IRON, RETICCTPCT in the last 72 hours. Sepsis Labs: Recent Labs  Lab 09/28/17 0802 09/28/17 0946 09/29/17 0013 09/30/17 0407  PROCALCITON  --   --  21.74 13.86  LATICACIDVEN  3.06* 2.77* 1.7  --     Recent Results (from the past 240 hour(s))  Blood Culture (routine x 2)     Status: None (Preliminary result)   Collection Time: 09/28/17 11:02 AM  Result Value Ref Range Status   Specimen Description   Final    BLOOD LEFT ANTECUBITAL Performed at [facility], [address] [place], [place] [address]    Special Requests   Final    BOTTLES DRAWN AEROBIC AND ANAEROBIC Blood Culture adequate volume Performed at [facility], [address] [place], [place] [address]    Culture   Final    NO GROWTH 2 DAYS Performed at [facility], [address] [place], [place] [address]    Report Status PENDING  Incomplete  Blood Culture (routine x 2)     Status: None (Preliminary result)   Collection Time: 09/28/17 11:09 AM  Result Value Ref Range Status   Specimen Description   Final    BLOOD LEFT FOREARM Performed at [facility], [address] [place], [place] [address]    Special Requests   Final    BOTTLES DRAWN AEROBIC AND ANAEROBIC Blood Culture adequate volume Performed at [facility], [address] [place], [place] [address]    Culture   Final    NO GROWTH 2 DAYS Performed at [facility], [address] [place], [place] [address]    Report Status PENDING  Incomplete  MRSA PCR Screening     Status: None   Collection Time: 09/28/17  5:01 PM  Result Value Ref Range Status   MRSA by PCR NEGATIVE NEGATIVE Final    Comment:        The GeneXpert MRSA Assay (FDA approved for NASAL specimens only), is one component of a comprehensive MRSA colonization surveillance program. It is not intended to diagnose MRSA infection nor to guide or monitor treatment for MRSA infections. Performed at [facility], [address] [place], [place] [address]   CSF culture     Status: None (Preliminary result)   Collection Time: 09/28/17  6:13 PM  Result Value Ref Range  Status   Specimen Description CSF  Final     Special Requests TUBE 2  Final   Gram Stain NO WBC SEEN NO ORGANISMS SEEN CYTOSPIN SMEAR   Final   Culture   Final    NO GROWTH 3 DAYS Performed at [facility], [address] [place], [place] [address]    Report Status PENDING  Incomplete  Respiratory Panel by PCR     Status: None   Collection Time: 09/29/17  1:13 AM  Result Value Ref Range Status   Adenovirus NOT DETECTED NOT DETECTED Final   Coronavirus 229E NOT DETECTED NOT DETECTED Final   Coronavirus HKU1 NOT DETECTED NOT DETECTED Final   Coronavirus NL63 NOT DETECTED NOT DETECTED Final   Coronavirus OC43 NOT DETECTED NOT DETECTED Final   Metapneumovirus NOT DETECTED NOT DETECTED Final   Rhinovirus / Enterovirus NOT DETECTED NOT DETECTED Final   Influenza A NOT DETECTED NOT DETECTED Final   Influenza B NOT DETECTED NOT DETECTED Final   Parainfluenza Virus 1 NOT DETECTED NOT DETECTED Final   Parainfluenza Virus 2 NOT DETECTED NOT DETECTED Final   Parainfluenza Virus 3 NOT DETECTED NOT DETECTED Final   Parainfluenza Virus 4 NOT DETECTED NOT DETECTED Final   Respiratory Syncytial Virus NOT DETECTED NOT DETECTED Final   Bordetella pertussis NOT DETECTED NOT DETECTED Final   Chlamydophila pneumoniae NOT DETECTED NOT DETECTED Final   Mycoplasma pneumoniae NOT DETECTED NOT DETECTED Final    Comment: Performed at [facility], [address] [place], [place] [address]  Culture, respiratory (NON-Expectorated)     Status: None   Collection Time: 09/29/17  5:40 AM  Result Value Ref Range Status   Specimen Description TRACHEAL ASPIRATE  Final   Special Requests NONE  Final   Gram Stain   Final    MODERATE WBC PRESENT, PREDOMINANTLY MONONUCLEAR NO SQUAMOUS EPITHELIAL CELLS SEEN NO ORGANISMS SEEN    Culture   Final    NO GROWTH 2 DAYS Performed at  Hospital Lab, [address] [place],  [address]    Report Status 10/01/2017 FINAL  Final       Radiology  Studies: Mr Cervical Spine Wo Contrast  Result Date: 10/01/2017 CLINICAL DATA:  Diffuse pain, subjective numbness and weakness. Evaluate inflammatory arthropathy. EXAM: MRI CERVICAL SPINE WITHOUT CONTRAST TECHNIQUE: Multiplanar, multisequence MR imaging of the cervical spine was performed. No intravenous contrast was administered. COMPARISON:  MRI of the head September 29, 2017 FINDINGS: ALIGNMENT: Straightened cervical lordosis.  No malalignment. VERTEBRAE/DISCS: Vertebral bodies are intact. Intervertebral disc morphology's and signal are normal. Multilevel mild chronic discogenic endplate changes without acute or abnormal bone marrow signal. No basilar invagination. CORD:Cervical spinal cord is normal morphology and signal characteristics from the cervicomedullary junction to level of T2-3, the most caudal well visualized level. POSTERIOR FOSSA, VERTEBRAL ARTERIES, PARASPINAL TISSUES: No MR findings of ligamentous injury. Vertebral artery flow voids present. Partially empty sella. DISC LEVELS (mildly motion degraded axial sequences): C2-3: Small central disc extrusion with approximately 6 mm superior and inferior migration. Extrusion contacts but does not deform the central spinal cord. Moderate LEFT facet arthropathy. No canal stenosis or neural foraminal narrowing. C4-5: Tiny central disc protrusion. Mild to moderate LEFT facet arthropathy. No canal stenosis or neural foraminal narrowing. C4-5: Small central disc protrusion. Mild LEFT facet arthropathy. No canal stenosis or neural foraminal narrowing. C5-6: Small central disc protrusion. No canal stenosis or neural foraminal narrowing. C6-7: Small to moderate RIGHT central disc protrusion does not contact the spinal cord. No canal stenosis or neural foraminal narrowing.   C7-T1: No disc bulge, canal stenosis nor neural foraminal narrowing. IMPRESSION: 1. No fracture, malalignment or acute osseous process. 2. Multilevel disc protrusions and, small C2-3 disc  extrusion. 3. No canal stenosis or neural foraminal narrowing any level. Electronically Signed   By: Elon Alas M.D.   On: 10/01/2017 01:23   Nm Hepatobiliary Liver Func  Result Date: 09/30/2017 CLINICAL DATA:  Abdominal pain, possible cholecystitis on CT and ultrasound. EXAM: NUCLEAR MEDICINE HEPATOBILIARY IMAGING TECHNIQUE: Sequential images of the abdomen were obtained out to 60 minutes following intravenous administration of radiopharmaceutical. RADIOPHARMACEUTICALS:  5.2 mCi Tc-56m Choletec IV COMPARISON:  Abdominal ultrasound of September 28, 2017 FINDINGS: Prompt uptake and biliary excretion of activity by the liver is seen. Gallbladder activity is visualized, consistent with patency of cystic duct. Biliary activity passes into small bowel, consistent with patent common bile duct. IMPRESSION: Normal hepatobiliary scan.  Patent cystic and common bile ducts. Electronically Signed   By: David  JMartiniqueM.D.   On: 09/30/2017 12:13   Dg Hand 2 View Right  Result Date: 09/30/2017 CLINICAL DATA:  Rheumatoid arthritis. Bilateral hand swelling and pain. EXAM: RIGHT HAND - 2 VIEW COMPARISON:  None. FINDINGS: Marked soft tissue swelling throughout the hand and fingers. No underlying acute osseous abnormality. No osseous erosions or joint subluxations. IMPRESSION: Diffuse soft tissue swelling. No osseous findings of rheumatoid arthritis. No acute osseous abnormality. Electronically Signed   By: MLorin PicketM.D.   On: 09/30/2017 11:23   Dg Hand 2 View Left  Result Date: 09/30/2017 CLINICAL DATA:  Rheumatoid arthritis with hand swelling EXAM: LEFT HAND - 2 VIEW COMPARISON:  None. FINDINGS: Marked diffuse dorsal soft tissue swelling. No underlying bone erosions identified. Overall bone mineralization appears normal. The joint spaces appear relatively well preserved. IMPRESSION: 1. Marked dorsal soft tissue swelling. 2. No focal bone abnormalities. Electronically Signed   By: TKerby MoorsM.D.   On:  09/30/2017 11:23   Dg Chest Port 1 View  Result Date: 09/30/2017 CLINICAL DATA:  Respiratory failure EXAM: PORTABLE CHEST 1 VIEW COMPARISON:  09/29/2017 FINDINGS: Interval extubation.  Removal enteric tube. Lungs are essentially clear. Possible trace right pleural effusion. No pneumothorax. The heart is normal in size. IMPRESSION: Interval extubation. Possible trace right pleural effusion. Electronically Signed   By: SJulian HyM.D.   On: 09/30/2017 07:18      Scheduled Meds: . enoxaparin (LOVENOX) injection  30 mg Subcutaneous Q24H  . mouth rinse  15 mL Mouth Rinse BID   Continuous Infusions: . sodium chloride       LOS: 3 days    Time spent: 45 minutes   JDessa Phi DO [facility] [email or web address] Password [email or web address] 10/01/2017, 2:26 PM

## 2017-10-01 NOTE — Progress Notes (Signed)
Subjective: She still complains of "numbness" of the left hand, but when I actually test sensation she says that is equal.  No matter what it is, she feels it is improving  Exam: Vitals:   10/01/17 0928 10/01/17 1522  BP: 123/70 121/78  Pulse: 85 89  Resp: 18 18  Temp: (!) 97.4 F (36.3 C) 97.6 F (36.4 C)  SpO2: 100% 97%   Gen: In bed, NAD Resp: non-labored breathing, no acute distress Abd: soft, nt Ext: Significant edema, worst in the left hand.  She has a papular rash over the ulnar radial aspect of her forearm bilaterally, diffuse macular rash on her chest, legs  Neuro: MS: Awake, alert, interactive and appropriate CN: Peoples round and reactive, extra ocular movements intact, visual fields full Motor: She has good strength in bilateral legs in the right arm, and the left arm she is able to give good resistance proximally, but guards due to pain, she gives a better effort today, but continues to guard significantly because of pain.  She does not give good effort and thumb abduction, finger extension, interossei(e.g. all 3 major nerves) Sensory: She endorses symmetric sensation to light touch and temperature DTR: 2+ and symmetric at the biceps, triceps, brachioradialis, patella  Impression: 52 year old female with diffuse pain, subjective numbness and weakness.  I am not sure that a primary neurological event is going on, but I am very concerned about rheumatological causes of the symptoms.  Especially given the elevations in her inflammatory markers which are quite pronounced.  I do think that further imaging with her cervical spine is prudent, but I suspect right now that her left hand numbness/weakness which does not fit a peripheral nerve distribution, is more likely to be due to the edema.   Typically if this was a vasculitic neuropathy, it would not get better and would fit a peripheral nerve distribution.   She also complains of headaches which is a chronic problem for her.   This is unchanged from her typical headache and has been going on for years, so I doubt giant cell arteritis.  Recommendations: 1) evaluation for bacterial infection (as evidenced by severely elevated pro-calcitonin) per internal medicine. 2) evaluation of other rheumatological causes of recurrent symptoms per internal medicine 3) if she continues to complain of numbness/weakness after her pain is better controlled and her edema has improved, could consider EMG/nerve conduction study 4) no further workup from a neurological standpoint at this time, please call with further questions or concerns.  Roland Rack, MD [facility] [phone]  If 7pm- 7am, please page neurology on call as listed in [place].

## 2017-10-01 NOTE — Progress Notes (Signed)
No stones on U/S and normal HIDA  No signs of gallbladder issues that require surgery Will sign off

## 2017-10-02 ENCOUNTER — Other Ambulatory Visit: Payer: Self-pay

## 2017-10-02 DIAGNOSIS — J9601 Acute respiratory failure with hypoxia: Principal | ICD-10-CM

## 2017-10-02 DIAGNOSIS — R590 Localized enlarged lymph nodes: Secondary | ICD-10-CM

## 2017-10-02 LAB — HEPATIC FUNCTION PANEL
ALT: 230 U/L — ABNORMAL HIGH (ref 14–54)
AST: 93 U/L — ABNORMAL HIGH (ref 15–41)
Albumin: 2.9 g/dL — ABNORMAL LOW (ref 3.5–5.0)
Alkaline Phosphatase: 142 U/L — ABNORMAL HIGH (ref 38–126)
Bilirubin, Direct: 0.1 mg/dL (ref 0.1–0.5)
Indirect Bilirubin: 0.5 mg/dL (ref 0.3–0.9)
Total Bilirubin: 0.6 mg/dL (ref 0.3–1.2)
Total Protein: 6.4 g/dL — ABNORMAL LOW (ref 6.5–8.1)

## 2017-10-02 LAB — ANA W/REFLEX IF POSITIVE: Anti Nuclear Antibody(ANA): POSITIVE — AB

## 2017-10-02 LAB — BASIC METABOLIC PANEL
Anion gap: 12 (ref 5–15)
BUN: 7 mg/dL (ref 6–20)
CO2: 23 mmol/L (ref 22–32)
Calcium: 8.7 mg/dL — ABNORMAL LOW (ref 8.9–10.3)
Chloride: 103 mmol/L (ref 101–111)
Creatinine, Ser: 0.67 mg/dL (ref 0.44–1.00)
GFR calc Af Amer: >60 mL/min (ref >60)
GFR calc non Af Amer: >60 mL/min (ref >60)
Glucose, Bld: 99 mg/dL (ref 65–99)
Potassium: 3.8 mmol/L (ref 3.5–5.1)
Sodium: 138 mmol/L (ref 135–145)

## 2017-10-02 LAB — CBC
HCT: 32.8 % — ABNORMAL LOW (ref 36.0–46.0)
Hemoglobin: 11.3 g/dL — ABNORMAL LOW (ref 12.0–15.0)
MCH: 31.7 pg (ref 26.0–34.0)
MCHC: 34.5 g/dL (ref 30.0–36.0)
MCV: 92.1 fL (ref 78.0–100.0)
Platelets: 156 10*3/uL (ref 150–400)
RBC: 3.56 MIL/uL — ABNORMAL LOW (ref 3.87–5.11)
RDW: 15.7 % — ABNORMAL HIGH (ref 11.5–15.5)
WBC: 4.3 10*3/uL (ref 4.0–10.5)

## 2017-10-02 LAB — CSF CULTURE W GRAM STAIN: Culture: NO GROWTH

## 2017-10-02 LAB — ENA+DNA/DS+ANTICH+CENTRO+JO...
Anti JO-1: <0.2 AI (ref 0.0–0.9)
Centromere Ab Screen: <0.2 AI (ref 0.0–0.9)
Chromatin Ab SerPl-aCnc: 0.9 AI (ref 0.0–0.9)
ENA SM Ab Ser-aCnc: <0.2 AI (ref 0.0–0.9)
Ribonucleic Protein: 1.7 AI — ABNORMAL HIGH (ref 0.0–0.9)
SSA (Ro) (ENA) Antibody, IgG: >8 AI — ABNORMAL HIGH (ref 0.0–0.9)
SSB (La) (ENA) Antibody, IgG: <0.2 AI (ref 0.0–0.9)
Scleroderma (Scl-70) (ENA) Antibody, IgG: <0.2 AI (ref 0.0–0.9)
ds DNA Ab: 5 IU/mL (ref 0–9)

## 2017-10-02 LAB — LACTATE DEHYDROGENASE: LDH: 341 U/L — ABNORMAL HIGH (ref 98–192)

## 2017-10-02 LAB — CSF CULTURE: Gram Stain: NONE SEEN

## 2017-10-02 LAB — CYCLIC CITRUL PEPTIDE ANTIBODY, IGG/IGA: CCP Antibodies IgG/IgA: 10 units (ref 0–19)

## 2017-10-02 MED ORDER — PREDNISONE 20 MG PO TABS
20.0000 mg | ORAL_TABLET | Freq: Once | ORAL | Status: AC
  Administered 2017-10-02: 20 mg via ORAL
  Filled 2017-10-02: qty 1

## 2017-10-02 MED ORDER — LIDOCAINE HCL (PF) 1 % IJ SOLN
INTRAMUSCULAR | Status: AC
  Filled 2017-10-02: qty 5

## 2017-10-02 MED ORDER — AMITRIPTYLINE HCL 25 MG PO TABS
25.0000 mg | ORAL_TABLET | Freq: Every day | ORAL | Status: DC
  Administered 2017-10-02: 25 mg via ORAL
  Filled 2017-10-02: qty 1

## 2017-10-02 MED ORDER — PROMETHAZINE HCL 25 MG/ML IJ SOLN
12.5000 mg | Freq: Four times a day (QID) | INTRAMUSCULAR | Status: DC | PRN
  Administered 2017-10-02: 12.5 mg via INTRAVENOUS
  Filled 2017-10-02: qty 1

## 2017-10-02 NOTE — Progress Notes (Signed)
Dressing to rt wrist/arm has remained CDI.

## 2017-10-02 NOTE — Care Management Note (Signed)
Case Management Note  Patient Details  Name: Kathleen Costa MRN: [id number] Date of Birth: May 03, 1966  Subjective/Objective:     Pt in with acute encephalopathy. She is from home alone.                Action/Plan: CM consulted for [facility] for the patient and disability. CM met with the patient and she is interested in one of the [facility]. CM was able to get her an appointment at [facility]. Information on the AVS. Pt states she does not need any information on disability currently.  CM following.   Expected Discharge Date:                  Expected Discharge Plan:  Home/Self Care  In-House Referral:     Discharge planning Services  CM Consult(PCP)  Post Acute Care Choice:    Choice offered to:     DME Arranged:    DME Agency:     HH Arranged:    HH Agency:     Status of Service:  In process, will continue to follow  If discussed at Long Length of Stay Meetings, dates discussed:    Additional Comments:  Pollie Friar, RN 10/02/2017, 12:15 PM

## 2017-10-02 NOTE — Evaluation (Signed)
Occupational Therapy Evaluation Patient Details Name: Kathleen Costa MRN: [id number] DOB: 1965/10/26 Today's Date: 10/02/2017    History of Present Illness Patient is a 52 y/o female who presents with sudden onset of headache and sepsis with AMS and inability to protect her airway so was intubated 2/2-2/3. Found to have Toxic/metabolic encephalopathy secondary to adverse Interaction of amitriptyline and Relpax. Abdominal CT scan-thickening of gallbladder wall. MR cervical spine-Multilevel disc protrusions and, small C2-3 disc extrusion. PMH includes RA and migraines.    Clinical Impression   PTA, pt was living alone and was independent with ADLs and IADLs. Pt currently requiring Min A for ADLs and functional mobility. Pt presenting with poor functional use of bilateral hands with increased edema and poor grasp, pinch, and FM skills. Pt also presenting with decreased balance. Pt would benefit from further acute OT to address decreased hand strength, safety, and ADLs. Recommend dc home once medically stable per physician with possible need for OP OT pending progress.    Follow Up Recommendations  No OT follow up;Supervision/Assistance - 24 hour;Other (comment)(May need OP OT pending progress)    Equipment Recommendations  None recommended by OT    Recommendations for Other Services       Precautions / Restrictions Precautions Precautions: Fall Precaution Comments: dizziness- reported as chronic Restrictions Weight Bearing Restrictions: No      Mobility Bed Mobility Overal bed mobility: Modified Independent             General bed mobility comments: Increased time but no assist needed. + dizziness.  Reports this happens sometimes.   Transfers Overall transfer level: Needs assistance Equipment used: 1 person hand held assist Transfers: Sit to/from Stand Sit to Stand: Min guard         General transfer comment: assist for balance/weakness    Balance Overall balance  assessment: Needs assistance Sitting-balance support: No upper extremity supported Sitting balance-Leahy Scale: Good     Standing balance support: During functional activity Standing balance-Leahy Scale: Fair Standing balance comment: Close Min guard for dynamic standing due to instability/weakness.                            ADL either performed or assessed with clinical judgement   ADL Overall ADL's : Needs assistance/impaired Eating/Feeding: Minimal assistance;Sitting Eating/Feeding Details (indicate cue type and reason): Min A for opening lids and demonstrating poor grasp Grooming: Min guard;Standing   Upper Body Bathing: Minimal assistance;Sitting   Lower Body Bathing: Minimal assistance;Sit to/from stand   Upper Body Dressing : Minimal assistance;Sitting   Lower Body Dressing: Minimal assistance;Sit to/from stand Lower Body Dressing Details (indicate cue type and reason): Pt able to adjust socks by bringing ankles to kness. Demonstrating poor grasp  Toilet Transfer: Min guard;Ambulation(Simulated in recliner) Armed forces technical officer Details (indicate cue type and reason): Min Guard for safety     Tub/ Shower Transfer: Minimal assistance;Ambulation;Cueing for safety;Tub transfer Tub/Shower Transfer Details (indicate cue type and reason): Min A for balance Functional mobility during ADLs: Minimal assistance;Min guard;Cueing for safety General ADL Comments: Pt with decreased funcitonal performance and present with poor balance, activity tolerance, and funtional use of hands     Vision Baseline Vision/History: Wears glasses Wears Glasses: Reading only Patient Visual Report: No change from baseline Vision Assessment?: Yes Eye Alignment: Within Functional Limits Ocular Range of Motion: Within Functional Limits Alignment/Gaze Preference: Within Defined Limits Tracking/Visual Pursuits: Able to track stimulus in all quads without difficulty Convergence: Within functional  limits Visual Fields: No apparent deficits     Perception     Praxis      Pertinent Vitals/Pain Pain Assessment: Faces Faces Pain Scale: Hurts even more Pain Location: headache and Bil hands with movement Pain Descriptors / Indicators: Headache;Discomfort;Grimacing Pain Intervention(s): Monitored during session;Limited activity within patient's tolerance;Repositioned     Hand Dominance Right   Extremity/Trunk Assessment Upper Extremity Assessment Upper Extremity Assessment: RUE deficits/detail;LUE deficits/detail RUE Deficits / Details: Edematous hand. Poor grasp strength and FM skills. Pt with difficulty performing ADLs due to decreased fucntional use of Bil hands. Shoulder and elbow ROM WFL RUE: Unable to fully assess due to pain RUE Sensation: decreased light touch RUE Coordination: decreased fine motor LUE Deficits / Details: Edematous hand. Poor grasp strength and FM skills. Pt with difficulty performing ADLs due to decreased fucntional use of Bil hands. Shoulder and elbow ROM WFL LUE: Unable to fully assess due to pain LUE Sensation: decreased light touch LUE Coordination: decreased fine motor   Lower Extremity Assessment Lower Extremity Assessment: Generalized weakness   Cervical / Trunk Assessment Cervical / Trunk Assessment: Normal   Communication Communication Communication: No difficulties   Cognition Arousal/Alertness: Awake/alert Behavior During Therapy: WFL for tasks assessed/performed Overall Cognitive Status: Within Functional Limits for tasks assessed                                 General Comments: WFL for ADLs completed   General Comments  Boyfriend present during evaluation. Education pt on edema management    Exercises     Shoulder Instructions      Home Living Family/patient expects to be discharged to:: Private residence Living Arrangements: Alone Available Help at Discharge: Family;Available PRN/intermittently Type of  Home: House Home Access: Level entry     Home Layout: One level     Bathroom Shower/Tub: Teacher, early years/pre: Standard     Home Equipment: None          Prior Functioning/Environment Level of Independence: Independent        Comments: Reports she recently got laid off from job secondary to RA- emotional about it. Drives. Can stay with her daughter if needed.        OT Problem List: Decreased strength;Decreased range of motion;Decreased activity tolerance;Impaired balance (sitting and/or standing);Impaired UE functional use;Pain;Increased edema      OT Treatment/Interventions: Self-care/ADL training;Therapeutic exercise;Energy conservation;DME and/or AE instruction;Therapeutic activities;Patient/family education    OT Goals(Current goals can be found in the care plan section) Acute Rehab OT Goals Patient Stated Goal: to go home OT Goal Formulation: With patient Time For Goal Achievement: 10/16/17 Potential to Achieve Goals: Good ADL Goals Pt Will Perform Grooming: with modified independence;standing Pt Will Perform Upper Body Dressing: with modified independence;sitting Pt Will Perform Lower Body Dressing: with modified independence;sit to/from stand Pt Will Transfer to Toilet: with modified independence;ambulating;regular height toilet Pt Will Perform Toileting - Clothing Manipulation and hygiene: with modified independence;sit to/from stand Pt/caregiver will Perform Home Exercise Program: Increased ROM;Increased strength;Both right and left upper extremity;With theraputty;Independently;With written HEP provided  OT Frequency: Min 2X/week   Barriers to D/C:            Co-evaluation              AM-PAC PT "6 Clicks" Daily Activity     Outcome Measure Help from another person eating meals?: A Little Help from another person taking care of  personal grooming?: A Little Help from another person toileting, which includes using toliet, bedpan, or  urinal?: A Little Help from another person bathing (including washing, rinsing, drying)?: A Little Help from another person to put on and taking off regular upper body clothing?: A Little Help from another person to put on and taking off regular lower body clothing?: A Little 6 Click Score: 18   End of Session Equipment Utilized During Treatment: Gait belt Nurse Communication: Mobility status  Activity Tolerance: Patient tolerated treatment well Patient left: in bed;with call bell/phone within reach;with family/visitor present  OT Visit Diagnosis: Unsteadiness on feet (R26.81);Other abnormalities of gait and mobility (R26.89);Muscle weakness (generalized) (M62.81);Pain Pain - Right/Left: (Bilateral) Pain - part of body: Hand(Headache)                Time: 2233-6122 OT Time Calculation (min): 14 min Charges:  OT General Charges $OT Visit: 1 Visit OT Evaluation $OT Eval Moderate Complexity: 1 Mod G-Codes:     [place] MSOT, OTR/L Acute Rehab Pager: [phone] Office: [place] 10/02/2017, 4:45 PM

## 2017-10-02 NOTE — Progress Notes (Signed)
Pt resting quietly. Continues to complain of nausea.  Medication given per order. Will continue to monitor.

## 2017-10-02 NOTE — Progress Notes (Signed)
OT Cancellation    10/02/17 1011  OT Visit Information  Last OT Received On 10/02/17  Reason Eval/Treat Not Completed Medical issues which prohibited therapy. Upon arrival, pt sidelying in bed and had just thrown up; RN notified. Pt stating she still feels nauseous and fatigued. Pt agrees to participate in OT evaluation later today. Will return as schedule allows and as pt is feeling better.   Julian, OTR/L Acute Rehab Pager: [phone] Office: [phone]

## 2017-10-02 NOTE — Progress Notes (Signed)
PROGRESS NOTE    Kathleen Costa  FOY:[id number] DOB: 04/25/1966 DOA: 09/28/2017 PCP: Velna Hatchet, MD     Brief Narrative:  Kathleen Costa is a 52 yo [place] female with hx of rheumatoid arthritis (dx 2 years ago, not currently on treatment), migraine who was at home with her grandchildren when she started complaining of bilateral leg pains. She called her daughter and came to the ED for evaluation. In the ED, her pain progressed very quickly to her bilateral upper extremities. She also complained of arm and leg numbness. She had taken her amitriptyline and triptan for her chronic migraines prior to presentation. In the ED, CTA was completed which was negative for PE, CT abd/pelvis found gallbladder wall thickening. Throughout her evaluation in the ED, her respiratory status quickly deteriorated with increased work of breath and tachycardia. She was intubated in the ED and admitted to ICU. General surgery was also consulted due to concern for gallbladder wall thickening. She was subsequently extubated and transferred to [facility] service on 2/4.   Assessment & Plan:   Acute toxic/metabolic encephalopathy -resolved, possibly viral illness on admission, she was leukopenic with abnormal LFTs on admission to ICU -CT head negative -MRI brain negative  -TSH, free T4 within normal limits  -Mentation back to baseline  Diffuse myalgia, polyarthritis, paresthesia  -Hx rheumatoid arthritis, diagnosed by Dr. Amil Amen 2 years ago. Patient not currently on treatment  -Bilateral hand xrays with dorsal soft tissue swelling, no focal bone abnormalities  -Ct chest notes bilateral axillary adenopathy-likely non specific needs FU -MR cervical spine with multilevel disc protrusions, no fracture/malalignment/osseous process -Elevated CRP, ESR  -Rheumatoid factor negative  -Neurology consulted. Her presentation not consistent with neurologic etiology. Can resume her migraine medications, restarted Amitriptyline    -General surgery following for skin biopsy of lesions on her wrists. They are purpuritic, blistering and began on her left wrist, progressed to her right wrist.  -I suspect she may have an RA flare going on, she has active synovitis although mild, involving both wrists, metacarpophalangeal and PIP joints of both arms -ANA, RF and anti-CCP antibody is pending -Will try to discuss with Dr. Amil Amen today  Acute hypoxemic respiratory failure -Resolved, likely mediated by multiple anti-anxiety medications given in the ED  -Now extubated and stable on room air   Concern for sepsis on admission  -LP with unremarkable CSF, normal WBC, normal protein and clear supernatant, with RBCs in CSF being most consistent with bloody tap. Negative HSV. -Procalcitonin 21.74 --> 13.86. ?Could be secondary to autoimmune process  -Strep pneumo Ag negative -Respiratory viral panel, influenza negative  -HIV negative  -RPR negative  -Toxoplasma Ag negative  -Lactic acidosis improved  -UA negative  -No etiology of infection found, now off antibiotics   Gallbladder thickening -CT and RUQ positive for diffuse gallbladder wall thickening -HIDA negative, general surgery signed off  Elevated liver enzymes -Likely related to possible viral illness on admission -Hepatitis panel negative  -Improving  Hypokalemia -Replaced  Chronic headache/migraine -Resume amitriptyline QHS, continue Fioricet PRN  DVT prophylaxis: SCD Code Status: Full Family Communication: Son and daughter at bedside Disposition Plan: Likely home tomorrow with close rheumatology follow-up   Consultants:   PCCM admit  Neurology  General surgery   Antimicrobials:  Anti-infectives (From admission, onward)   Start     Dose/Rate Route Frequency Ordered Stop   09/29/17 1400  vancomycin (VANCOCIN) IVPB 1000 mg/200 mL premix  Status:  Discontinued     1,000 mg 200 mL/hr over 60 Minutes  Intravenous Every 24 hours 09/28/17 1703 09/30/17  0849   09/28/17 1800  cefTRIAXone (ROCEPHIN) 1 g in dextrose 5 % 50 mL IVPB  Status:  Discontinued     1 g 100 mL/hr over 30 Minutes Intravenous Every 24 hours 09/28/17 1334 09/30/17 0849   09/28/17 1400  vancomycin (VANCOCIN) IVPB 1000 mg/200 mL premix     1,000 mg 200 mL/hr over 60 Minutes Intravenous  Once 09/28/17 1345 09/28/17 1537   09/28/17 1100  piperacillin-tazobactam (ZOSYN) IVPB 3.375 g     3.375 g 100 mL/hr over 30 Minutes Intravenous  Once 09/28/17 1048 09/28/17 1138       Subjective: -Continues to have mild headache and  wrist and pain in her hands  Objective: Vitals:   10/02/17 0200 10/02/17 0516 10/02/17 0600 10/02/17 0926  BP: 118/76 116/76  123/75  Pulse: 84 82  87  Resp: '16 16  16  ' Temp: 98.6 F (37 C) 98.4 F (36.9 C)  98.2 F (36.8 C)  TempSrc: Oral Oral  Oral  SpO2: 98% 97%  100%  Weight:   53 kg (116 lb 13.5 oz)   Height:        Intake/Output Summary (Last 24 hours) at 10/02/2017 1406 Last data filed at 10/01/2017 1700 Gross per 24 hour  Intake 240 ml  Output -  Net 240 ml   Filed Weights   09/30/17 0754 10/01/17 0500 10/02/17 0600  Weight: 51.2 kg (112 lb 14 oz) 51 kg (112 lb 7 oz) 53 kg (116 lb 13.5 oz)    Examination:  Gen: Awake, Alert, Oriented X 3, pleasant female, no distress HEENT: PERRLA, Neck supple, no JVD Lungs: Good air movement bilaterally, CTAB CVS: RRR,No Gallops,Rubs or new Murmurs Abd: soft, Non tender, non distended, BS present Extremities: Swelling of both wrists and metacarpophalangeal and proximal interphalangeal joints Skin: Small raised purpuric lesions along both wrists  Data Reviewed: I have personally reviewed following labs and imaging studies  CBC: Recent Labs  Lab 09/28/17 0745  09/29/17 0013 09/30/17 0407 10/01/17 0451 10/01/17 1215 10/02/17 0152  WBC 1.8*   < > 3.7* 6.3 4.5 4.4 4.3  NEUTROABS 1.1*  --  3.1  --   --  1.6*  --   HGB 13.4   < > 11.0* 11.0* 10.6* 11.0* 11.3*  HCT 39.2   < > 32.7* 32.0*  31.6* 32.8* 32.8*  MCV 93.8   < > 92.4 90.7 92.4 91.1 92.1  PLT 203   < > 124* 150 143* 140* 156   < > = values in this interval not displayed.   Basic Metabolic Panel: Recent Labs  Lab 09/28/17 0745 09/28/17 1402 09/29/17 0013 09/29/17 1222 09/30/17 0407 10/01/17 0451 10/02/17 0152  NA 135  --  132*  --  137 138 138  K 4.0  --  4.1  --  4.2 3.4* 3.8  CL 102  --  107  --  108 105 103  CO2 24  --  17*  --  '22 22 23  ' GLUCOSE 128*  --  151*  --  106* 97 99  BUN 13  --  15  --  '10 11 7  ' CREATININE 0.76 0.59 0.71  --  0.60 0.62 0.67  CALCIUM 9.1  --  7.1*  --  8.1* 8.4* 8.7*  MG 1.8  --  1.3*  --   --   --   --   PHOS  --   --   --  2.9  --   --   --    GFR: Estimated Creatinine Clearance: 58.4 mL/min (by C-G formula based on SCr of 0.67 mg/dL). Liver Function Tests: Recent Labs  Lab 09/28/17 0745 09/29/17 0013 09/30/17 0407 10/02/17 0152  AST 315* 646* 235* 93*  ALT 253* 526* 364* 230*  ALKPHOS 176* 135* 132* 142*  BILITOT 1.4* 1.8* 0.9 0.6  PROT 7.7 5.2* 5.6* 6.4*  ALBUMIN 3.9 2.2* 2.4* 2.9*   No results for input(s): LIPASE, AMYLASE in the last 168 hours. Recent Labs  Lab 09/29/17 1222  AMMONIA 51*   Coagulation Profile: Recent Labs  Lab 09/29/17 0013 09/30/17 0407  INR 1.52 1.09   Cardiac Enzymes: Recent Labs  Lab 09/28/17 0745 09/28/17 2011 09/30/17 0407  CKTOTAL 119 365* 391*   BNP (last 3 results) No results for input(s): PROBNP in the last 8760 hours. HbA1C: No results for input(s): HGBA1C in the last 72 hours. CBG: Recent Labs  Lab 09/30/17 0346 09/30/17 0746 09/30/17 1125 09/30/17 1537 09/30/17 2003  GLUCAP 110* 116* 156* 96 86   Lipid Profile: No results for input(s): CHOL, HDL, LDLCALC, TRIG, CHOLHDL, LDLDIRECT in the last 72 hours. Thyroid Function Tests: No results for input(s): TSH, T4TOTAL, FREET4, T3FREE, THYROIDAB in the last 72 hours. Anemia Panel: No results for input(s): VITAMINB12, FOLATE, FERRITIN, TIBC, IRON,  RETICCTPCT in the last 72 hours. Sepsis Labs: Recent Labs  Lab 09/28/17 0802 09/28/17 0946 09/29/17 0013 09/30/17 0407  PROCALCITON  --   --  21.74 13.86  LATICACIDVEN 3.06* 2.77* 1.7  --     Recent Results (from the past 240 hour(s))  Blood Culture (routine x 2)     Status: None (Preliminary result)   Collection Time: 09/28/17 11:02 AM  Result Value Ref Range Status   Specimen Description   Final    BLOOD LEFT ANTECUBITAL Performed at [facility], [place] [address], [place], [place] [address]    Special Requests   Final    BOTTLES DRAWN AEROBIC AND ANAEROBIC Blood Culture adequate volume Performed at [facility] [address], [place], [place] [address]    Culture   Final    NO GROWTH 3 DAYS Performed at [facility], [place] [address], [place], [place] [address]    Report Status PENDING  Incomplete  Blood Culture (routine x 2)     Status: None (Preliminary result)   Collection Time: 09/28/17 11:09 AM  Result Value Ref Range Status   Specimen Description   Final    BLOOD LEFT FOREARM Performed at [facility] [address], [place], [place] [address]    Special Requests   Final    BOTTLES DRAWN AEROBIC AND ANAEROBIC Blood Culture adequate volume Performed at [facility] [address], [place], [place] [address]    Culture   Final    NO GROWTH 3 DAYS Performed at [facility], [place] [address] [place], [place] [address]    Report Status PENDING  Incomplete  MRSA PCR Screening     Status: None   Collection Time: 09/28/17  5:01 PM  Result Value Ref Range Status   MRSA by PCR NEGATIVE NEGATIVE Final    Comment:        The GeneXpert MRSA Assay (FDA approved for NASAL specimens only), is one component of a comprehensive MRSA colonization surveillance program. It is not intended to diagnose MRSA infection nor to guide or monitor treatment for  MRSA infections. Performed at  [facility], [place] [address], [place], [place] [address]   CSF culture     Status: None   Collection Time: 09/28/17  6:13 PM  Result Value Ref Range Status   Specimen Description CSF  Final   Special Requests TUBE 2  Final   Gram Stain NO WBC SEEN NO ORGANISMS SEEN CYTOSPIN SMEAR   Final   Culture   Final    NO GROWTH 3 DAYS Performed at [facility], [place] [address] [place], [place] [address]    Report Status 10/02/2017 FINAL  Final  Respiratory Panel by PCR     Status: None   Collection Time: 09/29/17  1:13 AM  Result Value Ref Range Status   Adenovirus NOT DETECTED NOT DETECTED Final   Coronavirus 229E NOT DETECTED NOT DETECTED Final   Coronavirus HKU1 NOT DETECTED NOT DETECTED Final   Coronavirus NL63 NOT DETECTED NOT DETECTED Final   Coronavirus OC43 NOT DETECTED NOT DETECTED Final   Metapneumovirus NOT DETECTED NOT DETECTED Final   Rhinovirus / Enterovirus NOT DETECTED NOT DETECTED Final   Influenza A NOT DETECTED NOT DETECTED Final   Influenza B NOT DETECTED NOT DETECTED Final   Parainfluenza Virus 1 NOT DETECTED NOT DETECTED Final   Parainfluenza Virus 2 NOT DETECTED NOT DETECTED Final   Parainfluenza Virus 3 NOT DETECTED NOT DETECTED Final   Parainfluenza Virus 4 NOT DETECTED NOT DETECTED Final   Respiratory Syncytial Virus NOT DETECTED NOT DETECTED Final   Bordetella pertussis NOT DETECTED NOT DETECTED Final   Chlamydophila pneumoniae NOT DETECTED NOT DETECTED Final   Mycoplasma pneumoniae NOT DETECTED NOT DETECTED Final    Comment: Performed at [facility], [place] [address] [place], [place] [address]  Culture, respiratory (NON-Expectorated)     Status: None   Collection Time: 09/29/17  5:40 AM  Result Value Ref Range Status   Specimen Description TRACHEAL ASPIRATE  Final   Special Requests NONE  Final   Gram Stain   Final    MODERATE WBC PRESENT, PREDOMINANTLY MONONUCLEAR NO SQUAMOUS EPITHELIAL CELLS SEEN NO ORGANISMS SEEN    Culture   Final     NO GROWTH 2 DAYS Performed at [facility], [place] [address], [place], [place] [address]    Report Status 10/01/2017 FINAL  Final       Radiology Studies: Mr Cervical Spine Wo Contrast  Result Date: 10/01/2017 CLINICAL DATA:  Diffuse pain, subjective numbness and weakness. Evaluate inflammatory arthropathy. EXAM: MRI CERVICAL SPINE WITHOUT CONTRAST TECHNIQUE: Multiplanar, multisequence MR imaging of the cervical spine was performed. No intravenous contrast was administered. COMPARISON:  MRI of the head September 29, 2017 FINDINGS: ALIGNMENT: Straightened cervical lordosis.  No malalignment. VERTEBRAE/DISCS: Vertebral bodies are intact. Intervertebral disc morphology's and signal are normal. Multilevel mild chronic discogenic endplate changes without acute or abnormal bone marrow signal. No basilar invagination. CORD:Cervical spinal cord is normal morphology and signal characteristics from the cervicomedullary junction to level of T2-3, the most caudal well visualized level. POSTERIOR FOSSA, VERTEBRAL ARTERIES, PARASPINAL TISSUES: No MR findings of ligamentous injury. Vertebral artery flow voids present. Partially empty sella. DISC LEVELS (mildly motion degraded axial sequences): C2-3: Small central disc extrusion with approximately 6 mm superior and inferior migration. Extrusion contacts but does not deform the central spinal cord. Moderate LEFT facet arthropathy. No canal stenosis or neural foraminal narrowing. C4-5: Tiny central disc protrusion. Mild to moderate LEFT facet arthropathy. No canal stenosis or neural foraminal  narrowing. C4-5: Small central disc protrusion. Mild LEFT facet arthropathy. No canal stenosis or neural foraminal narrowing. C5-6: Small central disc protrusion. No canal stenosis or neural foraminal narrowing. C6-7: Small to moderate RIGHT central disc protrusion does not contact the spinal cord. No canal stenosis or neural foraminal narrowing. C7-T1: No disc bulge, canal  stenosis nor neural foraminal narrowing. IMPRESSION: 1. No fracture, malalignment or acute osseous process. 2. Multilevel disc protrusions and, small C2-3 disc extrusion. 3. No canal stenosis or neural foraminal narrowing any level. Electronically Signed   By: Elon Alas M.D.   On: 10/01/2017 01:23      Scheduled Meds: . amitriptyline  25 mg Oral QHS  . enoxaparin (LOVENOX) injection  40 mg Subcutaneous Q24H  . lidocaine (PF)      . mouth rinse  15 mL Mouth Rinse BID   Continuous Infusions: . sodium chloride       LOS: 4 days    Time spent: 35 minutes   Domenic Polite, MD [facility] [email or web address] Password [email or web address] 10/02/2017, 2:06 PM

## 2017-10-02 NOTE — Progress Notes (Signed)
Physical Therapy Treatment Patient Details Name: Kathleen Costa MRN: [id number] DOB: Jun 21, 1966 Today's Date: 10/02/2017    History of Present Illness Patient is a 52 y/o female who presents with sudden onset of headache and sepsis with AMS and inability to protect her airway so was intubated 2/2-2/3. Found to have Toxic/metabolic encephalopathy secondary to adverse Interaction of amitriptyline and Relpax. Abdominal CT scan-thickening of gallbladder wall. MR cervical spine-Multilevel disc protrusions and, small C2-3 disc extrusion. PMH includes RA and migraines.     PT Comments    Patient progressing with ambulation distance and confidence during session with improved motivation and affect after up and in hall.  Feel she remains appropriate for home without PT follow up, but would benefit from referral to vocational rehab by her PCP once over this acute illness.   Follow Up Recommendations  No PT follow up;Supervision for mobility/OOB     Equipment Recommendations  None recommended by PT    Recommendations for Other Services       Precautions / Restrictions Precautions Precautions: Fall Precaution Comments: dizziness- reported as chronic    Mobility  Bed Mobility Overal bed mobility: Modified Independent                Transfers Overall transfer level: Needs assistance Equipment used: 1 person hand held assist Transfers: Sit to/from Stand Sit to Stand: Min guard         General transfer comment: assist for balance/weakness  Ambulation/Gait Ambulation/Gait assistance: Min assist;Min guard Ambulation Distance (Feet): 300 Feet Assistive device: None Gait Pattern/deviations: Wide base of support;Decreased stride length;Drifts right/left     General Gait Details: deferred DGI due to headache, but noted veering with head turns to look into rooms and demonstrates some speed variations throughout.  Cues for forward gaze due to h/o dizziness and  migraines   Stairs Stairs: Yes   Stair Management: Step to pattern;Forwards;One rail Left Number of Stairs: 4 General stair comments: assist for safety  Wheelchair Mobility    Modified Rankin (Stroke Patients Only)       Balance Overall balance assessment: Needs assistance Sitting-balance support: No upper extremity supported Sitting balance-Leahy Scale: Good     Standing balance support: During functional activity Standing balance-Leahy Scale: Fair                              Cognition Arousal/Alertness: Awake/alert Behavior During Therapy: WFL for tasks assessed/performed Overall Cognitive Status: Within Functional Limits for tasks assessed                                        Exercises      General Comments General comments (skin integrity, edema, etc.): Patient voiced sadness at being let go from her job three years ago where she had worked for 15 years.  Some difficulty trying to get another job.  Discussed vocational rehabilitation and to ask PCP for referral.      Pertinent Vitals/Pain Pain Score: 8  Pain Location: headache Pain Descriptors / Indicators: Headache Pain Intervention(s): Patient requesting pain meds-RN notified;Limited activity within patient's tolerance;Monitored during session    Home Living                      Prior Function            PT Goals (current goals can now  be found in the care plan section) Progress towards PT goals: Progressing toward goals    Frequency    Min 3X/week      PT Plan Current plan remains appropriate    Co-evaluation              AM-PAC PT "6 Clicks" Daily Activity  Outcome Measure  Difficulty turning over in bed (including adjusting bedclothes, sheets and blankets)?: None Difficulty moving from lying on back to sitting on the side of the bed? : None Difficulty sitting down on and standing up from a chair with arms (e.g., wheelchair, bedside commode,  etc,.)?: A Little Help needed moving to and from a bed to chair (including a wheelchair)?: A Little Help needed walking in hospital room?: A Little Help needed climbing 3-5 steps with a railing? : A Little 6 Click Score: 20    End of Session Equipment Utilized During Treatment: Gait belt Activity Tolerance: Patient tolerated treatment well Patient left: in bed;with call bell/phone within reach;with family/visitor present   PT Visit Diagnosis: Muscle weakness (generalized) (M62.81);Other abnormalities of gait and mobility (R26.89)     Time: 4680-3212 PT Time Calculation (min) (ACUTE ONLY): 22 min  Charges:  $Gait Training: 8-22 mins                    G CodesMagda Costa, [place] [phone] 10/02/2017    Kathleen Costa 10/02/2017, 10:14 AM

## 2017-10-02 NOTE — Procedures (Signed)
After informed verbal consent was obtained, using Betadine for cleansing and 1% Lidocaine without epinephrine for anesthetic, with sterile technique a 4 mm punch biopsy was used to obtain a biopsy specimen of the lesion. Hemostasis was obtained by pressure, steri-strip applied and pressure dressing was applied. Wound care instructions provided. Be alert for any signs of cutaneous infection. The specimen is labeled and sent to pathology for evaluation. The procedure was well tolerated without complications.  Jackson Latino, [facility] Surgery Pager [phone]

## 2017-10-03 LAB — CBC
HCT: 35.8 % — ABNORMAL LOW (ref 36.0–46.0)
Hemoglobin: 12.3 g/dL (ref 12.0–15.0)
MCH: 31.5 pg (ref 26.0–34.0)
MCHC: 34.4 g/dL (ref 30.0–36.0)
MCV: 91.6 fL (ref 78.0–100.0)
Platelets: 218 10*3/uL (ref 150–400)
RBC: 3.91 MIL/uL (ref 3.87–5.11)
RDW: 15.8 % — ABNORMAL HIGH (ref 11.5–15.5)
WBC: 5.1 10*3/uL (ref 4.0–10.5)

## 2017-10-03 LAB — COMPREHENSIVE METABOLIC PANEL
ALT: 177 U/L — ABNORMAL HIGH (ref 14–54)
AST: 79 U/L — ABNORMAL HIGH (ref 15–41)
Albumin: 3.2 g/dL — ABNORMAL LOW (ref 3.5–5.0)
Alkaline Phosphatase: 182 U/L — ABNORMAL HIGH (ref 38–126)
Anion gap: 12 (ref 5–15)
BUN: 7 mg/dL (ref 6–20)
CO2: 23 mmol/L (ref 22–32)
Calcium: 9.1 mg/dL (ref 8.9–10.3)
Chloride: 101 mmol/L (ref 101–111)
Creatinine, Ser: 0.59 mg/dL (ref 0.44–1.00)
GFR calc Af Amer: >60 mL/min (ref >60)
GFR calc non Af Amer: >60 mL/min (ref >60)
Glucose, Bld: 108 mg/dL — ABNORMAL HIGH (ref 65–99)
Potassium: 4 mmol/L (ref 3.5–5.1)
Sodium: 136 mmol/L (ref 135–145)
Total Bilirubin: 0.6 mg/dL (ref 0.3–1.2)
Total Protein: 7 g/dL (ref 6.5–8.1)

## 2017-10-03 LAB — CULTURE, BLOOD (ROUTINE X 2)
Culture: NO GROWTH
Culture: NO GROWTH
Special Requests: ADEQUATE
Special Requests: ADEQUATE

## 2017-10-03 MED ORDER — PREDNISONE 10 MG PO TABS: 20.0000 mg | ORAL_TABLET | Freq: Every day | ORAL | 0 refills | Status: DC

## 2017-10-03 MED ORDER — BUTALBITAL-APAP-CAFFEINE 50-325-40 MG PO TABS: 1.0000 | ORAL_TABLET | Freq: Four times a day (QID) | ORAL | 0 refills | Status: DC | PRN

## 2017-10-03 NOTE — Discharge Summary (Signed)
Physician Discharge Summary  Maylen Waltermire RWE:[id number] DOB: August 03, 1966 DOA: 09/28/2017  PCP: Velna Hatchet, MD  Admit date: 09/28/2017 Discharge date: 10/03/2017  Time spent: 45 minutes  Recommendations for Outpatient Follow-up:  1. PCP Dr.Holwerda in 1-2weeks, please check CBC/Cmet at follow up, she also has Axillary lymphadenopathy that needs FU 2. Rheumatology Dr.Beekman in 10days, I called and d/w Dr.Beekman 3. Please Follow UP Skin Biopsy and Pending Rheumatological labs  Discharge Diagnoses:    Suspected RA vs Lupus flare   Common migraine with intractable migraine   Lymphadenopathy   Myalgia   Polyarthritis   Paresthesias   Acute hypoxemic respiratory failure (HCC)   Elevated liver enzymes   Hypokalemia   Discharge Condition: stable  Diet recommendation: regular  Filed Weights   10/01/17 0500 10/02/17 0600 10/03/17 0155  Weight: 51 kg (112 lb 7 oz) 53 kg (116 lb 13.5 oz) 53.2 kg (117 lb 4.6 oz)    History of present illness:  Kathleen Costa is a 52 yo [place] female with hx of rheumatoid arthritis (dx 2 years ago, not currently on treatment), migraine who was at home with her grandchildren when she started complaining of bilateral leg pains. She called her daughter and came to the ED for evaluation. In the ED, her pain progressed very quickly to her bilateral upper extremities. She also complained of arm and leg numbness. She had taken her amitriptyline and triptan for her chronic migraines prior to presentation. In the ED, CTA was completed which was negative for PE, CT abd/pelvis found gallbladder wall thickening. In ED, got multiple sedating meds, her respiratory status quickly deteriorated with increased work of breath and tachycardia. She was intubated in the ED and admitted to ICU. General surgery was also consulted due to concern for gallbladder wall thickening, workup for GB disease was negative.  She was subsequently extubated and transferred to [facility] service on  2/4.   Hospital Course:   Acute toxic/metabolic encephalopathy -Due to narcotics and sedating meds given in ED and possibly viral illness on admission, she was leukopenic with abnormal LFTs on admission to ICU, this is resolving -MRI brain negative  -TSH, free T4 within normal limits  -Mentation improved, back to baseline  Diffuse myalgia, polyarthritis, paresthesia  -Hx seronegative rheumatoid arthritis, diagnosed by Dr. Amil Amen 2-3 years ago. Patient not currently on treatment and hasnt followed up with Rheum in about 2years. -Bilateral hand xrays noted dorsal soft tissue swelling, no focal bone abnormalities  -Ct chest notes bilateral axillary adenopathy-likely non specific needs FU -Elevated CRP at 9 and ESR was 45, ANA positive, Anti Ro Ab is positive >8 -Rheumatoid factor negative  -Neurology consulted. Her presentation not consistent with neurologic etiology -General surgery consulted for skin biopsy of lesions on her wrists. They are purpuritic, blistering and began on her left wrist, progressed to her right wrist.  -I suspect she may have a flare of Lupus or seronegative RA flare going on, with active synovitis although mild, involving both wrists, metacarpophalangeal and PIP joints of both arms -I called and discussed case with Dr.Beekman yesterday, he recomemnded FU in his Office soon and he will FU Skin biopsy and start management for her Rheumatological problem - I have discharged her on Prednisone 44m daily until FU with Dr.Beekman, pt and son advised of the importance of FU  Acute hypoxemic respiratory failure -Resolved, likely mediated by multiple narcotics and anti-anxiety medications given in the ED  -Now extubated and stable on room air   Concern for sepsis on  admission  -LP with unremarkable CSF, normal WBC, normal protein and clear supernatant, with RBCs in CSF being most consistent with bloody tap. Negative HSV. -Procalcitonin 21.74 --> 13.86. ?Could be  secondary to autoimmune process  -Strep pneumo Ag negative -Respiratory viral panel, influenza negative  -HIV negative  -RPR negative  -Toxoplasma Ag negative  -Lactic acidosis improved  -UA negative  -No etiology of infection found, now off antibiotics   Gallbladder thickening -CT and RUQ positive for diffuse gallbladder wall thickening -HIDA negative, general surgery signed off  Elevated liver enzymes -Likely related to possible viral illness on admission -Hepatitis panel negative  -Improving, LFTs near normal range at discharge  Hypokalemia -Replaced  Chronic headache/migraine -Resume amitriptyline QHS, continue Fioricet PRN -she follows with Dr.Willis for this    Discharge Exam: Vitals:   10/03/17 0538 10/03/17 0930  BP: 117/76 125/83  Pulse: 76 84  Resp: 16 20  Temp: 98.2 F (36.8 C) 97.9 F (36.6 C)  SpO2: 100% 100%    General: AAOxx3 Cardiovascular: S1S2/RRR Respiratory: CTAB  Discharge Instructions   Discharge Instructions    Diet - low sodium heart healthy   Complete by:  As directed    Increase activity slowly   Complete by:  As directed      Allergies as of 10/03/2017   No Known Allergies     Medication List    STOP taking these medications   aspirin-acetaminophen-caffeine 250-250-65 MG tablet Commonly known as:  EXCEDRIN MIGRAINE   erythromycin ophthalmic ointment   ibuprofen 200 MG tablet Commonly known as:  ADVIL,MOTRIN   methocarbamol 500 MG tablet Commonly known as:  ROBAXIN   naproxen 500 MG tablet Commonly known as:  NAPROSYN   NOREL AD 4-10-325 MG Tabs Generic drug:  Chlorphen-PE-Acetaminophen   SUMAtriptan 100 MG tablet Commonly known as:  IMITREX   SUMAtriptan 50 MG tablet Commonly known as:  IMITREX   terbinafine 250 MG tablet Commonly known as:  LAMISIL   valACYclovir 1000 MG tablet Commonly known as:  VALTREX     TAKE these medications   amitriptyline 25 MG tablet Commonly known as:  ELAVIL Take 1  tablet (25 mg total) by mouth at bedtime.   butalbital-acetaminophen-caffeine 50-325-40 MG tablet Commonly known as:  FIORICET, ESGIC Take 1-2 tablets by mouth every 6 (six) hours as needed for headache or migraine.   eletriptan 40 MG tablet Commonly known as:  RELPAX Take 1 tablet (40 mg total) by mouth 2 (two) times daily as needed. What changed:  reasons to take this   predniSONE 10 MG tablet Commonly known as:  DELTASONE Take 2 tablets (20 mg total) by mouth daily. Take 4m daily until FU with Dr.Beekman What changed:    medication strength  how much to take  how to take this  when to take this  additional instructions  Another medication with the same name was removed. Continue taking this medication, and follow the directions you see here.      No Known Allergies Follow-up Information    [facility].   Why:  Your appointment is at 9:30. Please arrive 15 min early and bring: picture ID, insurance information, current medications. Contact information: [address] [address] [phone]       BHennie Duos MD. Call in 10 day(s).   Specialty:  Rheumatology Why:  Please call Dr.Beekman's office for appt as soon as possible Contact information: [address] [place] [address] [phone]            The results of significant diagnostics from this hospitalization (including imaging, microbiology, ancillary and laboratory) are listed below for reference.    Significant Diagnostic Studies: Ct Head Wo Contrast  Result Date: 09/28/2017 CLINICAL DATA:  ams  Had contrasted scan this am EXAM: CT HEAD WITHOUT CONTRAST TECHNIQUE: Contiguous axial images were obtained from the base of the skull through the vertex without intravenous contrast. COMPARISON:  None. FINDINGS: Brain: There is no intra or extra-axial fluid collection or mass lesion. The basilar cisterns and ventricles have a  normal appearance. There is no CT evidence for acute infarction or hemorrhage. There are bilateral basal ganglia calcifications. Vascular: No hyperdense vessel or unexpected calcification. Skull: Normal. Negative for fracture or focal lesion. Sinuses/Orbits: There is dense opacity throughout the paranasal sinuses. Mastoid air cells are normally aerated. Orbits are unremarkable. Other: None IMPRESSION: 1.  No evidence for acute intracranial abnormality. 2. Bilateral basal ganglia calcifications. 3. Paranasal sinus disease, likely chronic. Electronically Signed   By: Nolon Nations M.D.   On: 09/28/2017 15:59   Ct Angio Chest Pe W And/or Wo Contrast  Result Date: 09/28/2017 CLINICAL DATA:  Sudden weakness. Elevated D-dimer. Severe headache. EXAM: CT ANGIOGRAPHY CHEST CT ABDOMEN AND PELVIS WITH CONTRAST TECHNIQUE: Multidetector CT imaging of the chest was performed using the standard protocol during bolus administration of intravenous contrast. Multiplanar CT image reconstructions and MIPs were obtained to evaluate the vascular anatomy. Multidetector CT imaging of the abdomen and pelvis was performed using the standard protocol during bolus administration of intravenous contrast. CONTRAST:  118m ISOVUE-370 IOPAMIDOL (ISOVUE-370) INJECTION 76% COMPARISON:  None. FINDINGS: CTA CHEST FINDINGS Cardiovascular: Satisfactory opacification of the pulmonary arteries to the segmental level. No evidence of pulmonary embolism. Normal heart size. No pericardial effusion. Mediastinum/Nodes: No enlarged mediastinal, or hilar lymph nodes. Thyroid gland, trachea, and esophagus demonstrate no significant findings. Bilateral prominent axillary lymph nodes. Lungs/Pleura: Lungs are clear. No pleural effusion or pneumothorax. Musculoskeletal: No chest wall abnormality. No acute or significant osseous findings. Review of the MIP images confirms the above findings. CT ABDOMEN and PELVIS FINDINGS Hepatobiliary: Normal appearance of the  liver. Focal hyperenhancement and wall thickening of the counter dependent inferior portion of the gallbladder. Pancreas: Unremarkable. No pancreatic ductal dilatation or surrounding inflammatory changes. Spleen: Normal in size without focal abnormality. Adrenals/Urinary Tract: Adrenal glands are unremarkable. Kidneys are normal, without renal calculi, focal lesion, or hydronephrosis. Bladder is unremarkable. Stomach/Bowel: Stomach is within normal limits. Appendix appears normal. No evidence of bowel wall thickening, distention, or inflammatory changes. Vascular/Lymphatic: No significant vascular findings are present. No enlarged abdominal or pelvic lymph nodes. Reproductive: Uterus and bilateral adnexa are unremarkable. Other: No abdominal wall hernia or abnormality. No abdominopelvic ascites. Musculoskeletal: No acute or significant osseous findings. Review of the MIP images confirms the above findings. IMPRESSION: No evidence of pulmonary embolus. Moderate bilateral axillary lymphadenopathy, left greater than right. Further evaluation with focused bilateral axillary ultrasound may be considered to better characterize the morphology of the lymph nodes. Focal gallbladder wall thickening of the apex of the gallbladder. Right upper quadrant ultrasound may be considered for visualization of the gallbladder. Electronically Signed   By: DFidela SalisburyM.D.   On: 09/28/2017 10:35   Mr Brain Wo Contrast  Result Date: 09/29/2017 CLINICAL DATA:  Diffuse body pain and numbness. Fever and tachycardia. EXAM: MRI HEAD WITHOUT CONTRAST TECHNIQUE: Multiplanar, multiecho pulse sequences of the brain and surrounding  structures were obtained without intravenous contrast. COMPARISON:  Head CT 09/28/2017 FINDINGS: Brain: There is no evidence of acute infarct, intracranial hemorrhage, mass, midline shift, or extra-axial fluid collection. The ventricles and sulci are normal. The brain is normal in signal. A partially empty  sella is noted. Vascular: Major intracranial vascular flow voids are preserved. Skull and upper cervical spine: Unremarkable bone marrow signal. Sinuses/Orbits: Unremarkable orbits. Moderate, relatively diffuse paranasal sinus mucosal thickening. Left maxillary sinus mucous retention cysts. Small bilateral mastoid effusions. Other: None. IMPRESSION: 1. No acute intracranial abnormality. 2. Partially empty sella, otherwise unremarkable appearance of the brain. Electronically Signed   By: Logan Bores M.D.   On: 09/29/2017 12:21   Mr Cervical Spine Wo Contrast  Result Date: 10/01/2017 CLINICAL DATA:  Diffuse pain, subjective numbness and weakness. Evaluate inflammatory arthropathy. EXAM: MRI CERVICAL SPINE WITHOUT CONTRAST TECHNIQUE: Multiplanar, multisequence MR imaging of the cervical spine was performed. No intravenous contrast was administered. COMPARISON:  MRI of the head September 29, 2017 FINDINGS: ALIGNMENT: Straightened cervical lordosis.  No malalignment. VERTEBRAE/DISCS: Vertebral bodies are intact. Intervertebral disc morphology's and signal are normal. Multilevel mild chronic discogenic endplate changes without acute or abnormal bone marrow signal. No basilar invagination. CORD:Cervical spinal cord is normal morphology and signal characteristics from the cervicomedullary junction to level of T2-3, the most caudal well visualized level. POSTERIOR FOSSA, VERTEBRAL ARTERIES, PARASPINAL TISSUES: No MR findings of ligamentous injury. Vertebral artery flow voids present. Partially empty sella. DISC LEVELS (mildly motion degraded axial sequences): C2-3: Small central disc extrusion with approximately 6 mm superior and inferior migration. Extrusion contacts but does not deform the central spinal cord. Moderate LEFT facet arthropathy. No canal stenosis or neural foraminal narrowing. C4-5: Tiny central disc protrusion. Mild to moderate LEFT facet arthropathy. No canal stenosis or neural foraminal narrowing. C4-5:  Small central disc protrusion. Mild LEFT facet arthropathy. No canal stenosis or neural foraminal narrowing. C5-6: Small central disc protrusion. No canal stenosis or neural foraminal narrowing. C6-7: Small to moderate RIGHT central disc protrusion does not contact the spinal cord. No canal stenosis or neural foraminal narrowing. C7-T1: No disc bulge, canal stenosis nor neural foraminal narrowing. IMPRESSION: 1. No fracture, malalignment or acute osseous process. 2. Multilevel disc protrusions and, small C2-3 disc extrusion. 3. No canal stenosis or neural foraminal narrowing any level. Electronically Signed   By: Elon Alas M.D.   On: 10/01/2017 01:23   Nm Hepatobiliary Liver Func  Result Date: 09/30/2017 CLINICAL DATA:  Abdominal pain, possible cholecystitis on CT and ultrasound. EXAM: NUCLEAR MEDICINE HEPATOBILIARY IMAGING TECHNIQUE: Sequential images of the abdomen were obtained out to 60 minutes following intravenous administration of radiopharmaceutical. RADIOPHARMACEUTICALS:  5.2 mCi Tc-56m Choletec IV COMPARISON:  Abdominal ultrasound of September 28, 2017 FINDINGS: Prompt uptake and biliary excretion of activity by the liver is seen. Gallbladder activity is visualized, consistent with patency of cystic duct. Biliary activity passes into small bowel, consistent with patent common bile duct. IMPRESSION: Normal hepatobiliary scan.  Patent cystic and common bile ducts. Electronically Signed   By: David  JMartiniqueM.D.   On: 09/30/2017 12:13   Ct Abdomen Pelvis W Contrast  Result Date: 09/28/2017 CLINICAL DATA:  Sudden weakness. Elevated D-dimer. Severe headache. EXAM: CT ANGIOGRAPHY CHEST CT ABDOMEN AND PELVIS WITH CONTRAST TECHNIQUE: Multidetector CT imaging of the chest was performed using the standard protocol during bolus administration of intravenous contrast. Multiplanar CT image reconstructions and MIPs were obtained to evaluate the vascular anatomy. Multidetector CT imaging of the abdomen and  pelvis was performed using the standard protocol during bolus administration of intravenous contrast. CONTRAST:  161m ISOVUE-370 IOPAMIDOL (ISOVUE-370) INJECTION 76% COMPARISON:  None. FINDINGS: CTA CHEST FINDINGS Cardiovascular: Satisfactory opacification of the pulmonary arteries to the segmental level. No evidence of pulmonary embolism. Normal heart size. No pericardial effusion. Mediastinum/Nodes: No enlarged mediastinal, or hilar lymph nodes. Thyroid gland, trachea, and esophagus demonstrate no significant findings. Bilateral prominent axillary lymph nodes. Lungs/Pleura: Lungs are clear. No pleural effusion or pneumothorax. Musculoskeletal: No chest wall abnormality. No acute or significant osseous findings. Review of the MIP images confirms the above findings. CT ABDOMEN and PELVIS FINDINGS Hepatobiliary: Normal appearance of the liver. Focal hyperenhancement and wall thickening of the counter dependent inferior portion of the gallbladder. Pancreas: Unremarkable. No pancreatic ductal dilatation or surrounding inflammatory changes. Spleen: Normal in size without focal abnormality. Adrenals/Urinary Tract: Adrenal glands are unremarkable. Kidneys are normal, without renal calculi, focal lesion, or hydronephrosis. Bladder is unremarkable. Stomach/Bowel: Stomach is within normal limits. Appendix appears normal. No evidence of bowel wall thickening, distention, or inflammatory changes. Vascular/Lymphatic: No significant vascular findings are present. No enlarged abdominal or pelvic lymph nodes. Reproductive: Uterus and bilateral adnexa are unremarkable. Other: No abdominal wall hernia or abnormality. No abdominopelvic ascites. Musculoskeletal: No acute or significant osseous findings. Review of the MIP images confirms the above findings. IMPRESSION: No evidence of pulmonary embolus. Moderate bilateral axillary lymphadenopathy, left greater than right. Further evaluation with focused bilateral axillary ultrasound  may be considered to better characterize the morphology of the lymph nodes. Focal gallbladder wall thickening of the apex of the gallbladder. Right upper quadrant ultrasound may be considered for visualization of the gallbladder. Electronically Signed   By: DFidela SalisburyM.D.   On: 09/28/2017 10:35   Dg Hand 2 View Right  Result Date: 09/30/2017 CLINICAL DATA:  Rheumatoid arthritis. Bilateral hand swelling and pain. EXAM: RIGHT HAND - 2 VIEW COMPARISON:  None. FINDINGS: Marked soft tissue swelling throughout the hand and fingers. No underlying acute osseous abnormality. No osseous erosions or joint subluxations. IMPRESSION: Diffuse soft tissue swelling. No osseous findings of rheumatoid arthritis. No acute osseous abnormality. Electronically Signed   By: MLorin PicketM.D.   On: 09/30/2017 11:23   Dg Hand 2 View Left  Result Date: 09/30/2017 CLINICAL DATA:  Rheumatoid arthritis with hand swelling EXAM: LEFT HAND - 2 VIEW COMPARISON:  None. FINDINGS: Marked diffuse dorsal soft tissue swelling. No underlying bone erosions identified. Overall bone mineralization appears normal. The joint spaces appear relatively well preserved. IMPRESSION: 1. Marked dorsal soft tissue swelling. 2. No focal bone abnormalities. Electronically Signed   By: TKerby MoorsM.D.   On: 09/30/2017 11:23   Dg Chest Port 1 View  Result Date: 09/30/2017 CLINICAL DATA:  Respiratory failure EXAM: PORTABLE CHEST 1 VIEW COMPARISON:  09/29/2017 FINDINGS: Interval extubation.  Removal enteric tube. Lungs are essentially clear. Possible trace right pleural effusion. No pneumothorax. The heart is normal in size. IMPRESSION: Interval extubation. Possible trace right pleural effusion. Electronically Signed   By: SJulian HyM.D.   On: 09/30/2017 07:18   Dg Chest Port 1 View  Result Date: 09/29/2017 CLINICAL DATA:  Endotracheal tube in place. EXAM: PORTABLE CHEST 1 VIEW COMPARISON:  09/28/2017 FINDINGS: Endotracheal tube terminates  approximately 2.4 cm above the carina. Enteric tube courses into the stomach with tip not imaged. The cardiomediastinal silhouette is within normal limits. No airspace consolidation, sizeable pleural effusion, or pneumothorax is identified. IMPRESSION: Support devices as above.  No evidence of acute  airspace disease. Electronically Signed   By: Logan Bores M.D.   On: 09/29/2017 08:26   Dg Chest Port 1 View  Result Date: 09/28/2017 CLINICAL DATA:  Post intubation. EXAM: PORTABLE CHEST 1 VIEW COMPARISON:  CT of the chest 09/28/2017 FINDINGS: Post intubation. Endotracheal tube 2.6 cm above the carina. Enteric catheter with tip within the expected location of gastric body, side hole however slightly above the GE junction. Cardiomediastinal silhouette is normal. Mediastinal contours appear intact. There is no evidence of focal airspace consolidation, pleural effusion or pneumothorax. Osseous structures are without acute abnormality. Soft tissues are grossly normal. IMPRESSION: Post intubation and appropriate positioning of the endotracheal tube. Enteric catheter side hole slightly above the expected location of the GE junction. Advancement may be considered. Electronically Signed   By: Fidela Salisbury M.D.   On: 09/28/2017 13:37   US Abdomen Limited Ruq  Result Date: 09/28/2017 CLINICAL DATA:  Right upper quadrant abdomen, chest and back pain since 3 a.m. today. Focal wall thickening and enhancement involving the apex of the gallbladder on an abdomen and pelvis CT earlier today. EXAM: ULTRASOUND ABDOMEN LIMITED RIGHT UPPER QUADRANT COMPARISON:  Abdomen and pelvis CT obtained earlier today. FINDINGS: Gallbladder: Diffuse gallbladder wall thickening with areas of comet tail shadowing. The maximum wall thickness is 3.5 mm. No gallstones or pericholecystic fluid. No sonographic Murphy sign. Common bile duct: Diameter: 2.6 mm Liver: No focal lesion identified. Within normal limits in parenchymal echogenicity.  Portal vein is patent on color Doppler imaging with normal direction of blood flow towards the liver. IMPRESSION: Diffuse gallbladder wall thickening with, diffuse gallbladder wall thickening with areas of comet tail shadowing. This is compatible with adenomyomatosis. Otherwise, normal examination. Electronically Signed   By: Claudie Revering M.D.   On: 09/28/2017 13:16    Microbiology: Recent Results (from the past 240 hour(s))  Blood Culture (routine x 2)     Status: None (Preliminary result)   Collection Time: 09/28/17 11:02 AM  Result Value Ref Range Status   Specimen Description   Final    BLOOD LEFT ANTECUBITAL Performed at [facility] [address] [place], [place] [address]    Special Requests   Final    BOTTLES DRAWN AEROBIC AND ANAEROBIC Blood Culture adequate volume Performed at [facility] [address] [place], [place] [address]    Culture   Final    NO GROWTH 4 DAYS Performed at [facility], [place] [address] [place], [place] [address]    Report Status PENDING  Incomplete  Blood Culture (routine x 2)     Status: None (Preliminary result)   Collection Time: 09/28/17 11:09 AM  Result Value Ref Range Status   Specimen Description   Final    BLOOD LEFT FOREARM Performed at [facility] [address] [place], [place] [address]    Special Requests   Final    BOTTLES DRAWN AEROBIC AND ANAEROBIC Blood Culture adequate volume Performed at [facility] [address], [place], [place] [address]    Culture   Final    NO GROWTH 4 DAYS Performed at [facility], [place] [address] [place], [place] [address]    Report Status PENDING  Incomplete  MRSA PCR Screening     Status: None   Collection Time: 09/28/17  5:01 PM  Result Value Ref Range Status   MRSA by PCR NEGATIVE NEGATIVE Final    Comment:  The GeneXpert MRSA Assay (FDA approved for NASAL specimens only), is one  component of a comprehensive MRSA colonization surveillance program. It is not intended to diagnose MRSA infection nor to guide or monitor treatment for MRSA infections. Performed at [facility], [place] [address] [place], [place] [address]   CSF culture     Status: None   Collection Time: 09/28/17  6:13 PM  Result Value Ref Range Status   Specimen Description CSF  Final   Special Requests TUBE 2  Final   Gram Stain NO WBC SEEN NO ORGANISMS SEEN CYTOSPIN SMEAR   Final   Culture   Final    NO GROWTH 3 DAYS Performed at [facility], [place] [address] [place], [place] [address]    Report Status 10/02/2017 FINAL  Final  Respiratory Panel by PCR     Status: None   Collection Time: 09/29/17  1:13 AM  Result Value Ref Range Status   Adenovirus NOT DETECTED NOT DETECTED Final   Coronavirus 229E NOT DETECTED NOT DETECTED Final   Coronavirus HKU1 NOT DETECTED NOT DETECTED Final   Coronavirus NL63 NOT DETECTED NOT DETECTED Final   Coronavirus OC43 NOT DETECTED NOT DETECTED Final   Metapneumovirus NOT DETECTED NOT DETECTED Final   Rhinovirus / Enterovirus NOT DETECTED NOT DETECTED Final   Influenza A NOT DETECTED NOT DETECTED Final   Influenza B NOT DETECTED NOT DETECTED Final   Parainfluenza Virus 1 NOT DETECTED NOT DETECTED Final   Parainfluenza Virus 2 NOT DETECTED NOT DETECTED Final   Parainfluenza Virus 3 NOT DETECTED NOT DETECTED Final   Parainfluenza Virus 4 NOT DETECTED NOT DETECTED Final   Respiratory Syncytial Virus NOT DETECTED NOT DETECTED Final   Bordetella pertussis NOT DETECTED NOT DETECTED Final   Chlamydophila pneumoniae NOT DETECTED NOT DETECTED Final   Mycoplasma pneumoniae NOT DETECTED NOT DETECTED Final    Comment: Performed at [facility], [place] [address], [place], [place] [address]  Culture, respiratory (NON-Expectorated)     Status: None   Collection Time: 09/29/17  5:40 AM  Result Value Ref Range Status   Specimen Description TRACHEAL  ASPIRATE  Final   Special Requests NONE  Final   Gram Stain   Final    MODERATE WBC PRESENT, PREDOMINANTLY MONONUCLEAR NO SQUAMOUS EPITHELIAL CELLS SEEN NO ORGANISMS SEEN    Culture   Final    NO GROWTH 2 DAYS Performed at [facility], [place] [address], [place], [place] [address]    Report Status 10/01/2017 FINAL  Final     Labs: Basic Metabolic Panel: Recent Labs  Lab 09/28/17 0745  09/29/17 0013 09/29/17 1222 09/30/17 0407 10/01/17 0451 10/02/17 0152 10/03/17 0510  NA 135  --  132*  --  137 138 138 136  K 4.0  --  4.1  --  4.2 3.4* 3.8 4.0  CL 102  --  107  --  108 105 103 101  CO2 24  --  17*  --  _0 GLUCOSE 128*  --  151*  --  106* 97 99 108*  BUN 13  --  15  --  _1 CREATININE 0.76   < > 0.71  --  0.60 0.62 0.67 0.59  CALCIUM 9.1  --  7.1*  --  8.1* 8.4* 8.7* 9.1  MG 1.8  --  1.3*  --   --   --   --   --  PHOS  --   --   --  2.9  --   --   --   --    < > = values in this interval not displayed.   Liver Function Tests: Recent Labs  Lab 09/28/17 0745 09/29/17 0013 09/30/17 0407 10/02/17 0152 10/03/17 0510  AST 315* 646* 235* 93* 79*  ALT 253* 526* 364* 230* 177*  ALKPHOS 176* 135* 132* 142* 182*  BILITOT 1.4* 1.8* 0.9 0.6 0.6  PROT 7.7 5.2* 5.6* 6.4* 7.0  ALBUMIN 3.9 2.2* 2.4* 2.9* 3.2*   No results for input(s): LIPASE, AMYLASE in the last 168 hours. Recent Labs  Lab 09/29/17 1222  AMMONIA 51*   CBC: Recent Labs  Lab 09/28/17 0745  09/29/17 0013 09/30/17 0407 10/01/17 0451 10/01/17 1215 10/02/17 0152 10/03/17 0510  WBC 1.8*   < > 3.7* 6.3 4.5 4.4 4.3 5.1  NEUTROABS 1.1*  --  3.1  --   --  1.6*  --   --   HGB 13.4   < > 11.0* 11.0* 10.6* 11.0* 11.3* 12.3  HCT 39.2   < > 32.7* 32.0* 31.6* 32.8* 32.8* 35.8*  MCV 93.8   < > 92.4 90.7 92.4 91.1 92.1 91.6  PLT 203   < > 124* 150 143* 140* 156 218   < > = values in this interval not displayed.   Cardiac Enzymes: Recent Labs  Lab 09/28/17 0745 09/28/17 2011  09/30/17 0407  CKTOTAL 119 365* 391*   BNP: BNP (last 3 results) No results for input(s): BNP in the last 8760 hours.  ProBNP (last 3 results) No results for input(s): PROBNP in the last 8760 hours.  CBG: Recent Labs  Lab 09/30/17 0346 09/30/17 0746 09/30/17 1125 09/30/17 1537 09/30/17 2003  GLUCAP 110* 116* 156* 96 86       Signed:  Domenic Polite MD.  [facility] 10/03/2017, 2:27 PM

## 2017-10-03 NOTE — Care Management Note (Signed)
Case Management Note  Patient Details  Name: Kathleen Costa MRN: [id number] Date of Birth: 1965/12/31  Subjective/Objective:                    Action/Plan: Pt discharging home with self care. No f/u per PT/OT and no DME needs. CM arranged appt for patient at [facility].  Pt has transportation home.   Expected Discharge Date:  10/03/17               Expected Discharge Plan:  Home/Self Care  In-House Referral:     Discharge planning Services  CM Consult, [facility](PCP)  Post Acute Care Choice:    Choice offered to:     DME Arranged:    DME Agency:     HH Arranged:    HH Agency:     Status of Service:  Completed, signed off  If discussed at [employer], dates discussed:    Additional Comments:  Pollie Friar, RN 10/03/2017, 10:37 AM

## 2017-10-03 NOTE — Progress Notes (Signed)
D/c instructions, RX's and follow up appts explained and provided to patient verbalized understanding. Patient left floor accompanied by volunteers no c/o pain or shortness of breath.  Surles, Tivis Ringer, RN

## 2017-10-03 NOTE — Progress Notes (Signed)
Occupational Therapy Treatment Patient Details Name: Kathleen Costa MRN: [id number] DOB: [birth year] Today's Date: 10/03/2017    History of present illness Patient is a 52 y/o female who presents with sudden onset of headache and sepsis with AMS and inability to protect her airway so was intubated 2/2-2/3. Found to have Toxic/metabolic encephalopathy secondary to adverse Interaction of amitriptyline and Relpax. Abdominal CT scan-thickening of gallbladder wall. MR cervical spine-Multilevel disc protrusions and, small C2-3 disc extrusion. PMH includes RA and migraines.    OT comments  Pt with BUE weakness but functional. Educated pt/son on assistive devices to help with joint protection due to RA. Pt/son verbalized understanding. Recommended pt try some of the assistive devices and follow up with outpt OT as needed to progress level of independence  Follow Up Recommendations  Outpatient OT(neuro outpt)    Equipment Recommendations  None recommended by OT    Recommendations for Other Services      Precautions / Restrictions Precautions Precautions: Fall       Mobility Bed Mobility Overal bed mobility: Modified Independent                Transfers    modified Independent                  Balance                                           ADL either performed or assessed with clinical judgement   ADL                                         General ADL Comments: Pt overall set up with basic ADL tasks; Has difficulty opening containers and using fasteners; Educated pt/son on assistive devicies to help compensate for arthrtis     Vision       Perception     Praxis      Cognition Arousal/Alertness: Awake/alert Behavior During Therapy: WFL for tasks assessed/performed Overall Cognitive Status: Within Functional Limits for tasks assessed                                          Exercises Exercises: Other  exercises Other Exercises Other Exercises: fine motor/coordination Other Exercises: theraputty grip and pinch strengthening   Shoulder Instructions       General Comments      Pertinent Vitals/ Pain       Pain Assessment: Faces Faces Pain Scale: Hurts a little bit Pain Location: HA Pain Descriptors / Indicators: Headache Pain Intervention(s): Limited activity within patient's tolerance  Home Living                                          Prior Functioning/Environment              Frequency           Progress Toward Goals  OT Goals(current goals can now be found in the care plan section)  Progress towards OT goals: Progressing toward goals  Acute Rehab OT Goals Patient Stated Goal: to go home OT Goal  Formulation: With patient Potential to Achieve Goals: Good ADL Goals Pt Will Perform Grooming: with modified independence;standing Pt Will Perform Upper Body Dressing: with modified independence;sitting Pt Will Perform Lower Body Dressing: with modified independence;sit to/from stand Pt Will Transfer to Toilet: with modified independence;ambulating;regular height toilet Pt Will Perform Toileting - Clothing Manipulation and hygiene: with modified independence;sit to/from stand Pt/caregiver will Perform Home Exercise Program: Increased ROM;Increased strength;Both right and left upper extremity;With theraputty;Independently;With written HEP provided  Plan Discharge plan needs to be updated    Co-evaluation                 AM-PAC PT "6 Clicks" Daily Activity     Outcome Measure   Help from another person eating meals?: A Little Help from another person taking care of personal grooming?: A Little Help from another person toileting, which includes using toliet, bedpan, or urinal?: None Help from another person bathing (including washing, rinsing, drying)?: None Help from another person to put on and taking off regular upper body clothing?:  None Help from another person to put on and taking off regular lower body clothing?: None 6 Click Score: 22    End of Session    OT Visit Diagnosis: Unsteadiness on feet (R26.81);Other abnormalities of gait and mobility (R26.89);Muscle weakness (generalized) (M62.81);Pain Pain - part of body: (head)   Activity Tolerance Patient tolerated treatment well   Patient Left in bed;with call bell/phone within reach;with family/visitor present   Nurse Communication Mobility status        Time: 0938-1829 OT Time Calculation (min): 20 min  Charges: OT General Charges $OT Visit: 1 Visit OT Treatments $Therapeutic Activity: 8-22 mins  [facility], OT/L  [phone] 10/03/2017   WARD,HILLARY 10/03/2017, 1:41 PM

## 2017-10-16 ENCOUNTER — Ambulatory Visit: Payer: BLUE CROSS/BLUE SHIELD | Admitting: Family Medicine

## 2017-10-16 ENCOUNTER — Encounter: Payer: Self-pay | Admitting: Family Medicine

## 2017-10-16 VITALS — BP 106/84 | HR 90 | Temp 98.6°F | Resp 14 | Ht 60.0 in | Wt 106.0 lb

## 2017-10-16 DIAGNOSIS — Z789 Other specified health status: Secondary | ICD-10-CM | POA: Diagnosis not present

## 2017-10-16 DIAGNOSIS — M069 Rheumatoid arthritis, unspecified: Secondary | ICD-10-CM | POA: Diagnosis not present

## 2017-10-16 DIAGNOSIS — R591 Generalized enlarged lymph nodes: Secondary | ICD-10-CM | POA: Diagnosis not present

## 2017-10-16 DIAGNOSIS — Z131 Encounter for screening for diabetes mellitus: Secondary | ICD-10-CM | POA: Diagnosis not present

## 2017-10-16 DIAGNOSIS — R748 Abnormal levels of other serum enzymes: Secondary | ICD-10-CM

## 2017-10-16 DIAGNOSIS — D135 Benign neoplasm of extrahepatic bile ducts: Secondary | ICD-10-CM

## 2017-10-16 LAB — POCT URINALYSIS DIP (DEVICE)
Bilirubin Urine: NEGATIVE
Glucose, UA: NEGATIVE mg/dL
Hgb urine dipstick: NEGATIVE
Ketones, ur: NEGATIVE mg/dL
Nitrite: NEGATIVE
Protein, ur: NEGATIVE mg/dL
Specific Gravity, Urine: 1.01 (ref 1.005–1.030)
Urobilinogen, UA: 0.2 mg/dL (ref 0.0–1.0)
pH: 5.5 (ref 5.0–8.0)

## 2017-10-16 LAB — POCT GLYCOSYLATED HEMOGLOBIN (HGB A1C): Hemoglobin A1C: 5.3

## 2017-10-16 NOTE — Progress Notes (Signed)
Patient ID: Kathleen Costa, female    DOB: [birth year], 52 y.o.   MRN: [id number]  PCP: Scot Jun, FNP  Chief Complaint  Patient presents with  . Establish Care  . Hospitalization Follow-up    Subjective:  HPI [name] is a 52 y.o. female with RA (untreated for 2 years) skin lesions, chronic headaches, presents today to establish care and hospital follow-up. On 09/28/2017 patient presented to [facility] with severe shortness of breath and agitation,requiring intubation and sedation. She was worked up for possible Guillain-Barr syndrome, which was later found to be non-related to symptoms.She was found to have axillary lymphadenopathy and leukopenia. Today she reports adenopathy has resolved in axilla area. She is from the [place] 30 years ago therefore family history knowledge of parents is limited. She reports no personal history of cancer. Both of her parents died when patient was young. Denies  problems with stomach or N & V. She reports a history of RA and chronically experiences pain of the hands, knee, and hips, in spite  of taking prednisone. During recent hospitalization, liver enzymes were elevated. She denies history if alcohol use or drug abuse. Denies chest pain, shortness of breath, dizziness, edema, weakness, or fatigue. Social History   Socioeconomic History  . Marital status: Single    Spouse name: Not on file  . Number of children: 2  . Years of education: college-hs  . Highest education level: Not on file  Social Needs  . Financial resource strain: Not on file  . Food insecurity - worry: Not on file  . Food insecurity - inability: Not on file  . Transportation needs - medical: Not on file  . Transportation needs - non-medical: Not on file  Occupational History  . Occupation: Arts development officer: [employer]  Tobacco Use  . Smoking status: Never Smoker  . Smokeless tobacco: Never Used  Substance and Sexual Activity  . Alcohol  use: No  . Drug use: No  . Sexual activity: Not on file  Other Topics Concern  . Not on file  Social History Narrative   Lives with    Caffeine use: coffee daily   Right handed     Family History  Problem Relation Age of Onset  . Migraines Mother   . Migraines Sister   . Dementia Brother   . Migraines Brother   . Migraines Brother   . Cancer - Lung Brother    Review of Systems  Constitutional: Negative.   HENT: Negative.   Respiratory: Negative.   Cardiovascular: Negative.   Gastrointestinal: Negative.   Genitourinary: Negative.   Musculoskeletal: Positive for arthralgias and myalgias.  Neurological: Negative.   Hematological: Negative.   Psychiatric/Behavioral: Negative.     Patient Active Problem List   Diagnosis Date Noted  . Acute metabolic encephalopathy 35/00/9381  . Myalgia 10/01/2017  . Polyarthritis 10/01/2017  . Paresthesias 10/01/2017  . Acute hypoxemic respiratory failure ([place]) 10/01/2017  . Thickening of wall of gallbladder 10/01/2017  . Elevated liver enzymes 10/01/2017  . Hypokalemia 10/01/2017  . Lymphadenopathy 09/28/2017  . Common migraine with intractable migraine 04/22/2014    No Known Allergies  Prior to Admission medications   Medication Sig Start Date End Date Taking? Authorizing Provider  amitriptyline (ELAVIL) 25 MG tablet Take 1 tablet (25 mg total) by mouth at bedtime. 09/17/17  Yes [name], MD  butalbital-acetaminophen-caffeine (FIORICET, ESGIC) [phone] MG tablet Take 1-2 tablets by mouth every 6 (six) hours as needed for  headache or migraine. 10/03/17  Yes Domenic Polite, MD  eletriptan (RELPAX) 40 MG tablet Take 1 tablet (40 mg total) by mouth 2 (two) times daily as needed. Patient taking differently: Take 40 mg by mouth 2 (two) times daily as needed for migraine.  04/22/14  Yes [name], MD  predniSONE (DELTASONE) 10 MG tablet Take 2 tablets (20 mg total) by mouth daily. Take 20mg  daily until FU with Dr.Beekman  10/03/17  Yes Domenic Polite, MD    Past Medical, Surgical Family and Social History reviewed and updated.    Objective:   Today's Vitals   10/16/17 0930  BP: 106/84  Pulse: 90  Resp: 14  Temp: 98.6 F (37 C)  TempSrc: Oral  SpO2: 99%  Weight: 106 lb (48.1 kg)  Height: 5' (1.524 m)    Wt Readings from Last 3 Encounters:  10/16/17 106 lb (48.1 kg)  10/03/17 117 lb 4.6 oz (53.2 kg)  09/17/17 104 lb (47.2 kg)   Physical Exam  Constitutional: She is oriented to person, place, and time. She appears well-developed and well-nourished.  HENT:  Head: Normocephalic and atraumatic.  Mouth/Throat: No oropharyngeal exudate.  Eyes: Conjunctivae and EOM are normal. Pupils are equal, round, and reactive to light. No scleral icterus.  Neck: Normal range of motion. Neck supple.  Cardiovascular: Normal rate, regular rhythm, normal heart sounds and intact distal pulses.  Pulmonary/Chest: Effort normal and breath sounds normal. She exhibits no mass, no tenderness, no bony tenderness and no swelling. Right breast exhibits no inverted nipple, no mass, no nipple discharge, no skin change and no tenderness. Left breast exhibits no inverted nipple, no mass, no nipple discharge, no skin change and no tenderness. Breasts are symmetrical.  Abdominal: Soft. Bowel sounds are normal. She exhibits no distension and no mass. There is no tenderness. There is no rebound and no guarding.  Musculoskeletal: She exhibits tenderness. She exhibits no edema.  Lymphadenopathy:    She has no cervical adenopathy.  Neurological: She is alert and oriented to person, place, and time.  Skin: Skin is warm and dry.  Non-jaudice  Psychiatric: She has a normal mood and affect. Her behavior is normal. Judgment and thought content normal.   Assessment & Plan:  1. Lymphadenopathy, resolved on exam today. Will repeat CBC with Differential to evaluate for the presence of leukopenia. uncertain due to language barrier if patient has  had a recent mammogram. Patient is completing medical records release from prior provider. Upon receipt will review. If no recent mammogram within 12 months will recommend rescreening.  2. Rheumatoid arthritis, involving unspecified site, unspecified rheumatoid factor presence ([place]), recently referred to rhematologist at discharge form hospital. She is scheduled to follow-up with Dr. Amil Amen, Rheumatologist within the upcoming weeks. Continue prednisone 20 mg once daily.    3. Elevated liver enzymes, repeat Comprehensive metabolic panel, screen Hepatitis C, obtain  US Abdomen Limited RUQ;   4. Language barrier-additional time spent discussed treatment and plan.    5. Screening for diabetes mellitus, (Hb A1C)-5.3, normal. Repeat in 12 months.    Orders Placed This Encounter  Procedures  . US Abdomen Limited RUQ  . CBC with Differential  . Comprehensive metabolic panel  . Hepatitis c vrs RNA detect by PCR-qual  . POCT urinalysis dip (device)  . POCT glycosylated hemoglobin (Hb A1C)     RTC: 6 months for routine follow-up.    A total of 60 minutes spent, greater than 50 % of this time was spent providing additional  time for interpretation due to language barrier reviewing prior medical history, reviewing medications and indications of treatment, prior labs and diagnostic tests, discussing current plan of treatment, health promotion, and goals of treatment.     Carroll Sage. Kenton Kingfisher, MSN, FNP-C [facility]  [address] [place], [place] [address] [phone]

## 2017-10-18 LAB — CBC WITH DIFFERENTIAL/PLATELET
Basophils Absolute: 0.1 10*3/uL (ref 0.0–0.2)
Basos: 2 %
EOS (ABSOLUTE): 0 10*3/uL (ref 0.0–0.4)
Eos: 1 %
Hematocrit: 39.5 % (ref 34.0–46.6)
Hemoglobin: 13.2 g/dL (ref 11.1–15.9)
Immature Grans (Abs): 0.1 10*3/uL (ref 0.0–0.1)
Immature Granulocytes: 3 %
Lymphocytes Absolute: 1.6 10*3/uL (ref 0.7–3.1)
Lymphs: 37 %
MCH: 31.8 pg (ref 26.6–33.0)
MCHC: 33.4 g/dL (ref 31.5–35.7)
MCV: 95 fL (ref 79–97)
Monocytes Absolute: 0.6 10*3/uL (ref 0.1–0.9)
Monocytes: 13 %
Neutrophils Absolute: 1.8 10*3/uL (ref 1.4–7.0)
Neutrophils: 44 %
Platelets: 481 10*3/uL — ABNORMAL HIGH (ref 150–379)
RBC: 4.15 x10E6/uL (ref 3.77–5.28)
RDW: 16.6 % — ABNORMAL HIGH (ref 12.3–15.4)
WBC: 4.2 10*3/uL (ref 3.4–10.8)

## 2017-10-18 LAB — COMPREHENSIVE METABOLIC PANEL
ALT: 99 IU/L — ABNORMAL HIGH (ref 0–32)
AST: 60 IU/L — ABNORMAL HIGH (ref 0–40)
Albumin/Globulin Ratio: 1.1 — ABNORMAL LOW (ref 1.2–2.2)
Albumin: 4.1 g/dL (ref 3.5–5.5)
Alkaline Phosphatase: 158 IU/L — ABNORMAL HIGH (ref 39–117)
BUN/Creatinine Ratio: 15 (ref 9–23)
BUN: 10 mg/dL (ref 6–24)
Bilirubin Total: 0.3 mg/dL (ref 0.0–1.2)
CO2: 25 mmol/L (ref 20–29)
Calcium: 9.8 mg/dL (ref 8.7–10.2)
Chloride: 99 mmol/L (ref 96–106)
Creatinine, Ser: 0.65 mg/dL (ref 0.57–1.00)
GFR calc Af Amer: 117 mL/min/{1.73_m2} (ref >59)
GFR calc non Af Amer: 102 mL/min/{1.73_m2} (ref >59)
Globulin, Total: 3.6 g/dL (ref 1.5–4.5)
Glucose: 101 mg/dL — ABNORMAL HIGH (ref 65–99)
Potassium: 4.8 mmol/L (ref 3.5–5.2)
Sodium: 137 mmol/L (ref 134–144)
Total Protein: 7.7 g/dL (ref 6.0–8.5)

## 2017-10-18 LAB — HEPATITIS C VRS RNA DETECT BY PCR-QUAL: HCV RNA NAA Qualitative: NEGATIVE

## 2017-10-21 ENCOUNTER — Telehealth: Payer: Self-pay | Admitting: Family Medicine

## 2017-10-21 NOTE — Telephone Encounter (Signed)
Please schedule RUQ ultrasound and contact patient to advise due to continued elevated liver enzymes I will need to obtain an ultrasound further evaluate the cause of elevated enzymes.  Other labs were consistent with baseline.  Kathleen Costa. Kenton Kingfisher, MSN, FNP-C [facility]  [address] [place], [place] [address] [phone]

## 2017-10-22 NOTE — Telephone Encounter (Signed)
Patient notified that appointment is  10/25/2017 at 11:15am.

## 2017-10-25 ENCOUNTER — Ambulatory Visit (HOSPITAL_COMMUNITY)
Admission: RE | Admit: 2017-10-25 | Discharge: 2017-10-25 | Disposition: A | Discharge status: Home / Self Care | Payer: BLUE CROSS/BLUE SHIELD | Source: Ambulatory Visit | Attending: Family Medicine | Admitting: Family Medicine

## 2017-10-25 DIAGNOSIS — D135 Benign neoplasm of extrahepatic bile ducts: Secondary | ICD-10-CM

## 2017-10-25 DIAGNOSIS — R748 Abnormal levels of other serum enzymes: Secondary | ICD-10-CM

## 2017-10-25 HISTORY — DX: Benign neoplasm of extrahepatic bile ducts: D13.5

## 2017-10-26 NOTE — Addendum Note (Signed)
Addended by: Scot Jun on: 10/26/2017 01:50 PM   Modules accepted: Orders

## 2017-10-26 NOTE — Addendum Note (Signed)
Addended by: Scot Jun on: 10/26/2017 01:51 PM   Modules accepted: Level of Service

## 2017-10-28 ENCOUNTER — Telehealth: Payer: Self-pay | Admitting: Family Medicine

## 2017-10-28 NOTE — Telephone Encounter (Signed)
Contact patient to advise recent abdominal liver ultrasound warranted further evaluation by gastroenterologist. Her liver enzymes remain elevated and all hepatitis screening were negative. She has been referred to [place] [facility] and they will contact her to schedule an appointment  Carroll Sage. Kenton Kingfisher, MSN, FNP-C [facility]  [address] [place], [place] [address] [phone]

## 2017-10-28 NOTE — Telephone Encounter (Signed)
Spoke with patient.

## 2017-10-29 ENCOUNTER — Encounter: Payer: Self-pay | Admitting: Gastroenterology

## 2017-12-25 ENCOUNTER — Encounter: Payer: Self-pay | Admitting: Gastroenterology

## 2017-12-25 ENCOUNTER — Other Ambulatory Visit (INDEPENDENT_AMBULATORY_CARE_PROVIDER_SITE_OTHER): Payer: BLUE CROSS/BLUE SHIELD

## 2017-12-25 ENCOUNTER — Ambulatory Visit: Payer: BLUE CROSS/BLUE SHIELD | Admitting: Gastroenterology

## 2017-12-25 VITALS — BP 92/66 | HR 96 | Ht 61.0 in | Wt 114.2 lb

## 2017-12-25 DIAGNOSIS — R1310 Dysphagia, unspecified: Secondary | ICD-10-CM

## 2017-12-25 DIAGNOSIS — R748 Abnormal levels of other serum enzymes: Secondary | ICD-10-CM

## 2017-12-25 LAB — HEPATIC FUNCTION PANEL
ALT: 38 U/L — ABNORMAL HIGH (ref 0–35)
AST: 38 U/L — ABNORMAL HIGH (ref 0–37)
Albumin: 4 g/dL (ref 3.5–5.2)
Alkaline Phosphatase: 99 U/L (ref 39–117)
Bilirubin, Direct: 0.1 mg/dL (ref 0.0–0.3)
Total Bilirubin: 0.6 mg/dL (ref 0.2–1.2)
Total Protein: 7.7 g/dL (ref 6.0–8.3)

## 2017-12-25 NOTE — Progress Notes (Signed)
HPI :  52 y/o female from [place], history of RA, migraines, arthritis, referred by Molli Barrows FNP for elevated liver enzymes.  The patient was admitted to [facility] in early February with severe myalgias and respiratory failure. On presentation she developed fever, tachycardia, tachypnea. She was treated empirically for septic shock. She required intubation She had a CT angiogram done which showed no evidence of PE. She had some thickened gallbladder wall on CT but follow-up ultrasound in HIDA scan as below did not show any significant pathology. During this time her liver enzymes were acutely elevated, ALT and AST to 500s to 600s, relatively normal alkaline phosphatase and bili. Should a lumbar puncture which was negative as well as CT scan of the head, MRI brain. She had a negative infectious workup. Overall is determined she may have had a flare of rheumatoid arthritis or lupus that drove this acute presentation. Over time by the end of February her liver enzymes were significantly improved. She had a negative hepatitis panel, negative Tylenol level. She does history positive ANA in the past.  She has since recovered well from this hospitalization. She denies any history of liver disease or family history of liver disease. She denies any new medications at or around the time of her admission. She continues to have some headaches which bother her at times and continues to feel fatigued. She states her breathing is significantly improved. She denies much alcohol use.  Incidentally she does endorse symptoms of dysphagia over the past few months. She is having dysphagia to both solids and liquids. She denies any reflux changes, she denies any nausea or vomiting. She has dysphagia with most meals. She denies any abdominal pains.  Liver enzymes -  10/16/2017 - AP 158, ALT 99, AST 60, T bil 0,3, t protein normal 10/02/2017 - ALT 230, AST 93, AP 142 09/29/2017 - ALT 526, AST 646, T bil 1.8, AP  135   RUQ [place] 10/25/2017 - adenomyomatosis of the gallbladder, no stones, no focal abnormality of the liver otherwise HIDA scan 09/30/2017 - normal CT abdomen / pelvis with contrast 09/28/2017 - normal liver, axillary lymphadenopathy, thickening of apex of GB  Lab workup: Hep C RNA negative Positive ANA CK 391 Acute viral hep panel negative 09/28/2017   Past Medical History:  Diagnosis Date  . Adenomyomatosis of gallbladder 10/2017   Abd U/S  . Arthritis   . Disturbance of skin sensation   . Headache   . Migraine without aura, without mention of intractable migraine without mention of status migrainosus 04/22/2014  . Rheumatoid arthritis [facility])      Past Surgical History:  Procedure Laterality Date  . CARPAL TUNNEL RELEASE Right    Family History  Problem Relation Age of Onset  . Migraines Mother   . Migraines Sister   . Dementia Brother   . Migraines Brother   . Migraines Brother   . Cancer - Lung Brother    Social History   Tobacco Use  . Smoking status: Never Smoker  . Smokeless tobacco: Never Used  Substance Use Topics  . Alcohol use: No  . Drug use: No   Current Outpatient Medications  Medication Sig Dispense Refill  . amitriptyline (ELAVIL) 25 MG tablet Take 1 tablet (25 mg total) by mouth at bedtime. 30 tablet 4  . butalbital-acetaminophen-caffeine (FIORICET, ESGIC) 50-325-40 MG tablet Take 1-2 tablets by mouth every 6 (six) hours as needed for headache or migraine. 30 tablet 0  . eletriptan (RELPAX) 40 MG  tablet Take 1 tablet (40 mg total) by mouth 2 (two) times daily as needed. (Patient taking differently: Take 40 mg by mouth 2 (two) times daily as needed for migraine. ) 10 tablet 5  . predniSONE (DELTASONE) 10 MG tablet Take 2 tablets (20 mg total) by mouth daily. Take 20mg  daily until FU with Dr.Beekman 30 tablet 0   No current facility-administered medications for this visit.    No Known Allergies   Review of Systems: All systems reviewed and negative  except where noted in HPI.   Labs as above  Physical Exam: BP 92/66 (BP Location: Left Arm, Patient Position: Sitting, Cuff Size: Normal)   Pulse 96   Ht 5\' 1"  (1.549 m) Comment: height measured without shoes  Wt 114 lb 4 oz (51.8 kg)   BMI 21.59 kg/m  Constitutional: Pleasant,well-developed, female in no acute distress. HEENT: Normocephalic and atraumatic. Conjunctivae are normal. No scleral icterus. Neck supple.  Cardiovascular: Normal rate, regular rhythm.  Pulmonary/chest: Effort normal and breath sounds normal. No wheezing, rales or rhonchi. Abdominal: Soft, nondistended, nontender.  There are no masses palpable. No hepatomegaly. Extremities: no edema Lymphadenopathy: No cervical adenopathy noted. Neurological: Alert and oriented to person place and time. Skin: Skin is warm and dry. No rashes noted. Psychiatric: Normal mood and affect. Behavior is normal.   ASSESSMENT AND PLAN: 52 year old female with medical history as outlined above, including recent hospital course, located by respiratory failure and shock, noted to have elevated liver enzymes at that time which have improved with time. Here for consultation to discuss following issues:  Elevated liver enzymes - this could have been due to shock liver in the setting of hypotension / ? septic shock during her acute illness. Acute infectious (viral) hepatitis also remains possible (EBV/CMV? She was not tested for these), versus drug-induced liver injury which seems less likely given no new medications or herbal supplements around this time. As she does have other autoimmune disorders including positive ANA, I would screen her for other labs for autoimmune hepatitis to ensure negative. That being said other than that I would repeat LFTs at this time now a few months further out from her episode to trend. If her LFTs have normalized or significantly down trended I would continue to monitor and probably no further workup is needed.  Seems like this elevation was acute and not a chronic process, but trend liver enzymes. If they remain elevated to the extent that were previously without another clear etiology, would then pursue further serologic workup and may consider liver biopsy over time. Based on her prior trend of LFTs I think that will be unlikely. Will let her know results of labs.   Dysphagia - relatively new complaint of solid and liquid dysphagia over the past few months since her acute illness. I discussed options for evaluation to include barium swallow versus EGD. Given her recent episode of respiratory failure will start with a barium swallow to give her more time to recover prior to consideration for EGD. She agreed with the plan.  [place] Cellar, MD [facility]  CC: Scot Jun, FNP

## 2017-12-25 NOTE — Patient Instructions (Addendum)
If you are age 54 or older, your body mass index should be between 23-30. Your Body mass index is 21.59 kg/m. If this is out of the aforementioned range listed, please consider follow up with your Primary Care Provider.  If you are age 72 or younger, your body mass index should be between 19-25. Your Body mass index is 21.59 kg/m. If this is out of the aformentioned range listed, please consider follow up with your Primary Care Provider.   Please go to the lab in the basement of our building to have lab work done as you leave today.   You have been scheduled for a Barium Esophogram at [facility] (1st floor of the hospital) on Thursday, 01-02-18 at 11:30am. Please arrive 15 minutes prior to your appointment for registration. Make certain not to have anything to eat or drink 3 hours prior to your test. If you need to reschedule for any reason, please contact radiology at [phone] to do so. __________________________________________________________________ A barium swallow is an examination that concentrates on views of the esophagus. This tends to be a double contrast exam (barium and two liquids which, when combined, create a gas to distend the wall of the oesophagus) or single contrast (non-ionic iodine based). The study is usually tailored to your symptoms so a good history is essential. Attention is paid during the study to the form, structure and configuration of the esophagus, looking for functional disorders (such as aspiration, dysphagia, achalasia, motility and reflux) EXAMINATION You may be asked to change into a gown, depending on the type of swallow being performed. A radiologist and radiographer will perform the procedure. The radiologist will advise you of the type of contrast selected for your procedure and direct you during the exam. You will be asked to stand, sit or lie in several different positions and to hold a small amount of fluid in your mouth before being asked to  swallow while the imaging is performed .In some instances you may be asked to swallow barium coated marshmallows to assess the motility of a solid food bolus. The exam can be recorded as a digital or video fluoroscopy procedure. POST PROCEDURE It will take 1-2 days for the barium to pass through your system. To facilitate this, it is important, unless otherwise directed, to increase your fluids for the next 24-48hrs and to resume your normal diet.  This test typically takes about 30 minutes to perform. ____________________________________________________________________________  Thank you for entrusting me with your care and for choosing [facility], Dr. Forest Cellar

## 2017-12-26 LAB — IGG: IgG (Immunoglobin G), Serum: 1596 mg/dL (ref 694–1618)

## 2017-12-29 LAB — ANTI-SMOOTH MUSCLE ANTIBODY, IGG: Actin (Smooth Muscle) Antibody (IGG): <20 U (ref <20)

## 2017-12-30 ENCOUNTER — Other Ambulatory Visit: Payer: Self-pay | Admitting: Gastroenterology

## 2017-12-30 DIAGNOSIS — R945 Abnormal results of liver function studies: Principal | ICD-10-CM

## 2017-12-30 DIAGNOSIS — R7989 Other specified abnormal findings of blood chemistry: Secondary | ICD-10-CM

## 2018-01-02 ENCOUNTER — Ambulatory Visit (HOSPITAL_COMMUNITY)
Admission: RE | Admit: 2018-01-02 | Discharge: 2018-01-02 | Disposition: A | Discharge status: Home / Self Care | Payer: BLUE CROSS/BLUE SHIELD | Source: Ambulatory Visit | Attending: Gastroenterology | Admitting: Gastroenterology

## 2018-01-02 DIAGNOSIS — K224 Dyskinesia of esophagus: Secondary | ICD-10-CM | POA: Insufficient documentation

## 2018-01-02 DIAGNOSIS — R748 Abnormal levels of other serum enzymes: Secondary | ICD-10-CM | POA: Insufficient documentation

## 2018-01-02 DIAGNOSIS — R1310 Dysphagia, unspecified: Secondary | ICD-10-CM | POA: Insufficient documentation

## 2018-01-29 DIAGNOSIS — Z0289 Encounter for other administrative examinations: Secondary | ICD-10-CM

## 2018-02-13 ENCOUNTER — Encounter: Payer: BLUE CROSS/BLUE SHIELD | Attending: Obstetrics and Gynecology | Admitting: Registered"

## 2018-02-13 ENCOUNTER — Encounter: Payer: Self-pay | Admitting: Registered"

## 2018-02-13 DIAGNOSIS — E781 Pure hyperglyceridemia: Secondary | ICD-10-CM | POA: Diagnosis not present

## 2018-02-13 DIAGNOSIS — Z713 Dietary counseling and surveillance: Secondary | ICD-10-CM | POA: Diagnosis not present

## 2018-02-13 NOTE — Progress Notes (Signed)
Medical Nutrition Therapy:  Appt start time: 0925 end time:  1018.   Assessment:  Primary concerns today: Pt referred due to elevated triglycerides. Pt likes to go by "Rosie." Pt reports she is unsure why she is here today, but knows her doctor recommended that she come in for an appointment. Pt would like to discuss her lab values taken at her last MD visit. Per MD note pt has been dx with RA.  Noted Lab Values: 01/13/18 Triglycerides: 171 mg/dL   Preferred Learning Style:  No preference indicated   Learning Readiness:   Ready  MEDICATIONS: See list.    DIETARY INTAKE:  Usual eating pattern includes 3 meals and 2 snacks per day. Typical snacks include chips, cookies, apple, mango, coconut.   Everyday foods include chicken noodle soup, white rice.  Avoided foods include none reported.    24-hr recall:  B ( AM): 1 cup coffee with sugar, plain cookie and one glazed donut  Snk ( AM): None reported.  L ( PM): rice, chicken, vegetables, iced sweet tea Snk ( PM): sometimes may have a snack D ( PM): vegetable and shrimp spring roll, chicken noodle soup, water  Snk ( PM): potato chips,  water Beverages: coffee, sweet tea, about 2 water bottles per day  Usual physical activity: tries to walk more, reports being limited due to arthritis pain. Pt reports that she does not do water activities because she has a fear of water.   Progress Towards Goal(s):  In progress.   Nutritional Diagnosis:  NB-1.1 Food and nutrition-related knowledge deficit As related to regarding heart healthy nutrition .  As evidenced by pt unsure how to eat to help reduce elevated triglyceride levels.    Intervention:  Nutrition counseling provided. Dietitian discussed pt's lab values. Provided education regarding heart healthy nutrition as well as anti-inflammatory foods to include as pt has been dx with rheumatoid arthritis. Discussed food sources of omega 3 fatty acids. Discussed including more water and less  sugar sweetened beverages. Recommended pt carry a water bottle with her and set a reminder to help her remember to drink water. Pt appeared agreeable to information/goals discussed.   Teaching Method Utilized:  Visual Auditory  Handouts given during visit include:  Balanced plate with food list.   Barriers to learning/adherence to lifestyle change: Limited education.   Demonstrated degree of understanding via:  Teach Back   Monitoring/Evaluation:  Dietary intake, exercise, and body weight prn.

## 2018-02-13 NOTE — Patient Instructions (Signed)
Instructions/Goals:  Make sure to get in three meals per day. Try to have balanced meals like the My Plate example (see handout). Try to include more vegetables, fruits, and whole grains at meals.   Include good sources of omega 3 fatty acids:   walnuts, sunflower seeds, flaxseed, chia seeds, flaxseed oil, soybean oil, canola oil, fatty fish like salmon, mackerel, albacore tuna, lake trout, sardines, and herring and certain fortified foods (such as certain brands of eggs, yogurt, juices, milk, soy beverages   Try to include more water and less sugar sweetened beverages. Recommend carrying a water bottle with you and setting a reminder on your phone to take a drink of water.

## 2018-03-12 ENCOUNTER — Other Ambulatory Visit (INDEPENDENT_AMBULATORY_CARE_PROVIDER_SITE_OTHER): Payer: BLUE CROSS/BLUE SHIELD

## 2018-03-12 ENCOUNTER — Encounter

## 2018-03-12 ENCOUNTER — Telehealth: Payer: Self-pay

## 2018-03-12 ENCOUNTER — Ambulatory Visit: Payer: BLUE CROSS/BLUE SHIELD | Admitting: Gastroenterology

## 2018-03-12 ENCOUNTER — Encounter: Payer: Self-pay | Admitting: Gastroenterology

## 2018-03-12 VITALS — BP 94/60 | HR 72 | Ht 61.0 in | Wt 112.1 lb

## 2018-03-12 DIAGNOSIS — R748 Abnormal levels of other serum enzymes: Secondary | ICD-10-CM

## 2018-03-12 DIAGNOSIS — R5383 Other fatigue: Secondary | ICD-10-CM

## 2018-03-12 LAB — COMPREHENSIVE METABOLIC PANEL
ALT: 19 U/L (ref 0–35)
AST: 22 U/L (ref 0–37)
Albumin: 4.4 g/dL (ref 3.5–5.2)
Alkaline Phosphatase: 84 U/L (ref 39–117)
BUN: 15 mg/dL (ref 6–23)
CO2: 27 mEq/L (ref 19–32)
Calcium: 9.4 mg/dL (ref 8.4–10.5)
Chloride: 105 mEq/L (ref 96–112)
Creatinine, Ser: 0.74 mg/dL (ref 0.40–1.20)
GFR: 87.08 mL/min (ref >60.00)
Glucose, Bld: 108 mg/dL — ABNORMAL HIGH (ref 70–99)
Potassium: 4.8 mEq/L (ref 3.5–5.1)
Sodium: 137 mEq/L (ref 135–145)
Total Bilirubin: 0.8 mg/dL (ref 0.2–1.2)
Total Protein: 8.1 g/dL (ref 6.0–8.3)

## 2018-03-12 LAB — CBC WITH DIFFERENTIAL/PLATELET
Basophils Absolute: 0 10*3/uL (ref 0.0–0.1)
Basophils Relative: 0.4 % (ref 0.0–3.0)
Eosinophils Absolute: 0 10*3/uL (ref 0.0–0.7)
Eosinophils Relative: 0.5 % (ref 0.0–5.0)
HCT: 38.4 % (ref 36.0–46.0)
Hemoglobin: 12.9 g/dL (ref 12.0–15.0)
Lymphocytes Relative: 39.1 % (ref 12.0–46.0)
Lymphs Abs: 1.6 10*3/uL (ref 0.7–4.0)
MCHC: 33.7 g/dL (ref 30.0–36.0)
MCV: 91.9 fl (ref 78.0–100.0)
Monocytes Absolute: 0.3 10*3/uL (ref 0.1–1.0)
Monocytes Relative: 7.4 % (ref 3.0–12.0)
Neutro Abs: 2.2 10*3/uL (ref 1.4–7.7)
Neutrophils Relative %: 52.6 % (ref 43.0–77.0)
Platelets: 228 10*3/uL (ref 150.0–400.0)
RBC: 4.17 Mil/uL (ref 3.87–5.11)
RDW: 14.5 % (ref 11.5–15.5)
WBC: 4.2 10*3/uL (ref 4.0–10.5)

## 2018-03-12 NOTE — Telephone Encounter (Signed)
I called the pt's PCP, Kathleen Costa's office and they have no records of her colonoscopy other than what we can see in [place]. The pt can't remember where or when she had it or who did it. I asked her to check her records at home to see if she can find some information that would help [place] track it down.  She "seemed" to express understanding.

## 2018-03-12 NOTE — Progress Notes (Signed)
HPI :  52 y/o female here for a follow up visit. See intake history for full details of her case.   The patient was admitted to [facility] in early February with severe myalgias and respiratory failure. On presentation she developed fever, tachycardia, tachypnea. She was treated empirically for septic shock. She required intubation She had a CT angiogram done which showed no evidence of PE. She had some thickened gallbladder wall on CT but follow-up ultrasound in HIDA scan as below did not show any significant pathology. During this time her liver enzymes were acutely elevated, ALT and AST to 500s to 600s, relatively normal alkaline phosphatase and bili. Should a lumbar puncture which was negative as well as CT scan of the head, MRI brain. She had a negative infectious workup. Overall is determined she may have had a flare of rheumatoid arthritis or lupus that drove this acute presentation.   Over time her liver enzymes were significantly improved. She had a negative hepatitis panel, negative Tylenol level. She does history positive ANA in the past. Since our last visit she had a negative smooth muscle antibody and IgG level. Her LFTs have improved to ALT and AST of 30s a few months ago.   Since her last visit she also had a barium swallow for evaluation of dysphagia. Barium swallow showed mild nonspecific dysmotility without focal stenosis. She states over time her swallowing difficulty has resolved and she no longer has any problems eating.  Her main complaint is ongoing fatigue. She is now on Xeljanz and prednisone for history of rheumatoid arthritis. She does states this upsets her stomach slightly but it does appear to be helping her joints. She is not sure why she feels so tired. She states she was walking in [employer] the other day and felt lightheaded. She sat down for several minutes and the symptoms abated. She did not sink apprise. She denies any chest pain, no shortness of breath, no  palpitations.  Of note she does endorse having a prior colonoscopy for screening purposes and states was normal. She is not sure where this was done.   RUQ [place] 10/25/2017 - adenomyomatosis of the gallbladder, no stones, no focal abnormality of the liver otherwise HIDA scan 09/30/2017 - normal CT abdomen / pelvis with contrast 09/28/2017 - normal liver, axillary lymphadenopathy, thickening of apex of GB    Past Medical History:  Diagnosis Date  . Adenomyomatosis of gallbladder 10/2017   Abd U/S  . Arthritis   . Disturbance of skin sensation   . Headache   . Migraine without aura, without mention of intractable migraine without mention of status migrainosus 04/22/2014  . Rheumatoid arthritis [facility])      Past Surgical History:  Procedure Laterality Date  . CARPAL TUNNEL RELEASE Right    Family History  Problem Relation Age of Onset  . Migraines Mother   . Migraines Sister   . Dementia Brother   . Migraines Brother   . Migraines Brother   . Cancer - Lung Brother    Social History   Tobacco Use  . Smoking status: Never Smoker  . Smokeless tobacco: Never Used  Substance Use Topics  . Alcohol use: No  . Drug use: No   Current Outpatient Medications  Medication Sig Dispense Refill  . amitriptyline (ELAVIL) 25 MG tablet Take 1 tablet (25 mg total) by mouth at bedtime. 30 tablet 4  . butalbital-acetaminophen-caffeine (FIORICET, ESGIC) 50-325-40 MG tablet Take 1-2 tablets by mouth every 6 (six)  hours as needed for headache or migraine. 30 tablet 0  . eletriptan (RELPAX) 40 MG tablet Take 1 tablet (40 mg total) by mouth 2 (two) times daily as needed. (Patient taking differently: Take 40 mg by mouth 2 (two) times daily as needed for migraine. ) 10 tablet 5  . predniSONE (DELTASONE) 10 MG tablet Take 2 tablets (20 mg total) by mouth daily. Take 20mg  daily until FU with Dr.Beekman 30 tablet 0  . Tofacitinib Citrate (XELJANZ PO) Take by mouth.     No current facility-administered  medications for this visit.    No Known Allergies   Review of Systems: All systems reviewed and negative except where noted in HPI.   Lab Results  Component Value Date   WBC 4.2 03/12/2018   HGB 12.9 03/12/2018   HCT 38.4 03/12/2018   MCV 91.9 03/12/2018   PLT 228.0 03/12/2018    Lab Results  Component Value Date   CREATININE 0.74 03/12/2018   BUN 15 03/12/2018   NA 137 03/12/2018   K 4.8 03/12/2018   CL 105 03/12/2018   CO2 27 03/12/2018    Lab Results  Component Value Date   ALT 19 03/12/2018   AST 22 03/12/2018   ALKPHOS 84 03/12/2018   BILITOT 0.8 03/12/2018     Physical Exam: BP 94/60 (BP Location: Left Arm, Patient Position: Sitting, Cuff Size: Normal)   Pulse 72   Ht 5\' 1"  (1.549 m)   Wt 112 lb 2 oz (50.9 kg)   BMI 21.19 kg/m  Constitutional: Pleasant,well-developed, female in no acute distress. HEENT: Normocephalic and atraumatic. Conjunctivae are normal. No scleral icterus. Neck supple.  Cardiovascular: Normal rate, regular rhythm.  Pulmonary/chest: Effort normal and breath sounds normal. No wheezing, rales or rhonchi. Abdominal: Soft, nondistended, nontender. There are no masses palpable. No hepatomegaly. Extremities: no edema Lymphadenopathy: No cervical adenopathy noted. Neurological: Alert and oriented to person place and time. Skin: Skin is warm and dry. No rashes noted. Psychiatric: Normal mood and affect. Behavior is normal.   ASSESSMENT AND PLAN: 52 year old female here for reassessment following issues:  Elevated liver enzymes - I suspect this may have been due to shock liver in the setting of hypotension or septic shock during her acute illness in February. Acute infectious (viral) hepatitis also possible, versus DILI although no clear medication or supplement she was taking at the time. Labs for AIH are negative. Her LFTs in May were significantly improved. Given her nonspecific fatigue will recheck her LFTs today to ensure they remain  normal. If they rise again over time I would consider a liver biopsy to further evaluate. If they remain normal I don't think any further workup is needed, she agreed.   Fatigue - will check CBC and CMET today, unclear if related to RA or something else (medications?). Recommend she follow up with her PCP regarding this issue and her recent episode of lightheadedness. She agreed.  Dysphagia - resolved, prior barium study with nonspecific dysmotility. If symptoms recur she should contact me for further assessment.  For her records, will also obtain copy of last colonoscopy report to determine when she is next due for screening.   Lawn Cellar, MD [facility]

## 2018-03-12 NOTE — Patient Instructions (Addendum)
If you are age 54 or older, your body mass index should be between 23-30. Your Body mass index is 21.19 kg/m. If this is out of the aforementioned range listed, please consider follow up with your Primary Care Provider.  If you are age 72 or younger, your body mass index should be between 19-25. Your Body mass index is 21.19 kg/m. If this is out of the aformentioned range listed, please consider follow up with your Primary Care Provider.   Please go to the lab in the basement of our building to have lab work done as you leave today.  Please follow up with your Primary Care Provider.   Thank you for entrusting me with your care and for choosing [facility], Dr.  Cellar

## 2018-03-13 NOTE — Telephone Encounter (Signed)
Okay thanks for letting me know, hopefully she has some information about it. She was confident she has had one before.

## 2018-04-15 ENCOUNTER — Ambulatory Visit (INDEPENDENT_AMBULATORY_CARE_PROVIDER_SITE_OTHER): Payer: BLUE CROSS/BLUE SHIELD | Admitting: Family Medicine

## 2018-04-15 ENCOUNTER — Encounter: Payer: Self-pay | Admitting: Family Medicine

## 2018-04-15 VITALS — BP 160/68 | HR 80 | Temp 97.9°F | Ht 61.0 in | Wt 115.6 lb

## 2018-04-15 DIAGNOSIS — Z09 Encounter for follow-up examination after completed treatment for conditions other than malignant neoplasm: Secondary | ICD-10-CM

## 2018-04-15 DIAGNOSIS — M791 Myalgia, unspecified site: Secondary | ICD-10-CM | POA: Diagnosis not present

## 2018-04-15 DIAGNOSIS — M069 Rheumatoid arthritis, unspecified: Secondary | ICD-10-CM | POA: Diagnosis not present

## 2018-04-15 DIAGNOSIS — Z131 Encounter for screening for diabetes mellitus: Secondary | ICD-10-CM | POA: Diagnosis not present

## 2018-04-15 LAB — POCT URINALYSIS DIP (MANUAL ENTRY)
Bilirubin, UA: NEGATIVE
Blood, UA: NEGATIVE
Glucose, UA: NEGATIVE mg/dL
Ketones, POC UA: NEGATIVE mg/dL
Leukocytes, UA: NEGATIVE
Nitrite, UA: NEGATIVE
Spec Grav, UA: 1.025 (ref 1.010–1.025)
Urobilinogen, UA: 0.2 E.U./dL
pH, UA: 5.5 (ref 5.0–8.0)

## 2018-04-15 LAB — POCT GLYCOSYLATED HEMOGLOBIN (HGB A1C): Hemoglobin A1C: 5 % (ref 4.0–5.6)

## 2018-04-15 NOTE — Progress Notes (Signed)
Follow Up  Subjective:    Patient ID: Kathleen Costa, female    DOB: 01-22-1966, 52 y.o.   MRN: [id number]   Chief Complaint  Patient presents with  . Follow-up    6 month routine    HPI  Kathleen Costa has a past medical history of Rhematoid Arthritis, Migraine, Headache, Arthritis, and Adenomyomatosis of Gallbladder. She is here today for follow up.   Current Status: Since her last office visit, she is doing well with no complaints. She continues to follow up with Rheumatologist for RA in her hands. She has generalized arthritis in other joints, which she take Prednisone to help with symptoms.   She denies fevers, chills, fatigue, recent infections, weight loss, and night sweats.   She has not had any headaches, visual changes, dizziness, and falls. No chest pain, heart palpitations, cough and shortness of breath reported.   No reports of GI problems such as nausea, vomiting, diarrhea, and constipation. She has no reports of blood in stools, dysuria and hematuria.   No depression or anxiety reported.   Her joint pain is mild today.    Past Medical History:  Diagnosis Date  . Adenomyomatosis of gallbladder 10/2017   Abd U/S  . Arthritis   . Disturbance of skin sensation   . Headache   . Migraine without aura, without mention of intractable migraine without mention of status migrainosus 04/22/2014  . Rheumatoid arthritis ([place])    Family History  Problem Relation Age of Onset  . Migraines Mother   . Migraines Sister   . Dementia Brother   . Migraines Brother   . Migraines Brother   . Cancer - Lung Brother     Social History   Socioeconomic History  . Marital status: Single    Spouse name: Not on file  . Number of children: 2  . Years of education: college-hs  . Highest education level: Not on file  Occupational History  . Occupation: Arts development officer: [employer]  Social Needs  . Financial resource strain: Not on file  . Food insecurity:    Worry:  Not on file    Inability: Not on file  . Transportation needs:    Medical: Not on file    Non-medical: Not on file  Tobacco Use  . Smoking status: Never Smoker  . Smokeless tobacco: Never Used  Substance and Sexual Activity  . Alcohol use: No  . Drug use: No  . Sexual activity: Not on file  Lifestyle  . Physical activity:    Days per week: Not on file    Minutes per session: Not on file  . Stress: Not on file  Relationships  . Social connections:    Talks on phone: Not on file    Gets together: Not on file    Attends religious service: Not on file    Active member of club or organization: Not on file    Attends meetings of clubs or organizations: Not on file    Relationship status: Not on file  . Intimate partner violence:    Fear of current or ex partner: Not on file    Emotionally abused: Not on file    Physically abused: Not on file    Forced sexual activity: Not on file  Other Topics Concern  . Not on file  Social History Narrative   Lives with    Caffeine use: coffee daily   Right handed     Past Surgical History:  Procedure Laterality Date  . CARPAL TUNNEL RELEASE Right     Immunization History  Administered Date(s) Administered  . Zoster Recombinat (Shingrix) 02/07/2018, 04/09/2018   Current Meds  Medication Sig  . amitriptyline (ELAVIL) 25 MG tablet Take 1 tablet (25 mg total) by mouth at bedtime.  . butalbital-acetaminophen-caffeine (FIORICET, ESGIC) 50-325-40 MG tablet Take 1-2 tablets by mouth every 6 (six) hours as needed for headache or migraine.  Marland Kitchen eletriptan (RELPAX) 40 MG tablet Take 1 tablet (40 mg total) by mouth 2 (two) times daily as needed. (Patient taking differently: Take 40 mg by mouth 2 (two) times daily as needed for migraine. )  . Tofacitinib Citrate (XELJANZ PO) Take by mouth.    No Known Allergies BP (!) 160/68 (BP Location: Left Arm, Patient Position: Sitting, Cuff Size: Small)   Pulse 80   Temp 97.9 F (36.6 C) (Oral)   Ht 5'  1" (1.549 m)   Wt 115 lb 9.6 oz (52.4 kg)   SpO2 100%   BMI 21.84 kg/m    Review of Systems  Constitutional: Positive for fatigue (mild).  HENT: Negative.   Eyes: Negative.   Respiratory: Negative.   Cardiovascular: Negative.   Gastrointestinal: Negative.   Endocrine: Negative.   Genitourinary: Negative.   Musculoskeletal: Positive for arthralgias (in hands r/t rheumatoid arthritis).  Skin: Negative.   Allergic/Immunologic: Negative.   Neurological: Positive for numbness (bilateral hands r/t rheumatoid arthritis.).  Hematological: Negative.   Psychiatric/Behavioral: Negative.    Objective:   Physical Exam  Constitutional: She is oriented to person, place, and time. She appears well-developed and well-nourished.  HENT:  Head: Normocephalic and atraumatic.  Right Ear: External ear normal.  Left Ear: External ear normal.  Nose: Nose normal.  Mouth/Throat: Oropharynx is clear and moist.  Eyes: Pupils are equal, round, and reactive to light. Conjunctivae and EOM are normal.  Neck: Normal range of motion. Neck supple.  Cardiovascular: Normal rate, regular rhythm, normal heart sounds and intact distal pulses.  Pulmonary/Chest: Effort normal and breath sounds normal.  Abdominal: Soft. Bowel sounds are normal.  Musculoskeletal: Normal range of motion.  Neurological: She is alert and oriented to person, place, and time.  Skin: Skin is warm. Capillary refill takes less than 2 seconds.  Psychiatric: She has a normal mood and affect. Her behavior is normal. Judgment and thought content normal.  Nursing note and vitals reviewed.  Assessment & Plan:   1. Screening for diabetes mellitus Hgb A1c is in normal range at 5.0 today.  She will continue to decrease foods/beverages high in sugars and carbs and follow Heart Healthy or DASH diet. Increase physical activity to at least 30 minutes cardio exercise daily.   - POCT glycosylated hemoglobin (Hb A1C) - POCT urinalysis dipstick  2.  Rheumatoid arthritis, involving unspecified site, unspecified rheumatoid factor presence ([place]) She continues to see Rheumatologist regularly. Continue Prednisone as prescribed.   3. Myalgia Stable.   4. Follow up She will follow up in 6 months.  No orders of the defined types were placed in this encounter.   Kathe Becton,  MSN, FNP-C [facility] [address] [place], [place] [address] [phone]

## 2018-04-22 ENCOUNTER — Other Ambulatory Visit: Payer: Self-pay | Admitting: Family Medicine

## 2018-04-24 ENCOUNTER — Telehealth: Payer: Self-pay

## 2018-04-24 DIAGNOSIS — R748 Abnormal levels of other serum enzymes: Secondary | ICD-10-CM

## 2018-04-24 NOTE — Telephone Encounter (Signed)
Called and spoke to pt. She is due for LFTs. (There is a slight language barrier). She is going out of town and wished to call us back when she returns. I am sending her a letter indicating she can go to the lab at her convenience in Sept for LFTs.

## 2018-04-24 NOTE — Addendum Note (Signed)
Addended by: Roetta Sessions on: 04/24/2018 03:06 PM   Modules accepted: Orders

## 2018-04-24 NOTE — Telephone Encounter (Signed)
-----   Message from Larina Bras, [place] sent at 12/30/2017 11:11 AM EDT ----- Pt needs repeat lfts around 04/29/18. Orders already in epic. Just needs to be reminded. See 12/27/17 labs.

## 2018-06-04 ENCOUNTER — Telehealth: Payer: Self-pay | Admitting: Gastroenterology

## 2018-06-04 NOTE — Telephone Encounter (Signed)
Hi Kathleen Costa, this Kathleen Costa called looking to schedule an appointment but she could not explain very well what kind of appointment she needs, I saw some notes that she was due for labs but she said that she already had those done and this was a different appointment. There was a little bit of a language barrier. Could you please call Kathleen Costa to clarify this and let me know what kind of appt she needs? Thank you

## 2018-06-05 NOTE — Telephone Encounter (Signed)
Called and spoke to pt.  Let her know she is due for lab work only to check liver function, not an office visit. She said she will go to the lab one day this week or next.

## 2018-06-09 ENCOUNTER — Other Ambulatory Visit (INDEPENDENT_AMBULATORY_CARE_PROVIDER_SITE_OTHER): Payer: BLUE CROSS/BLUE SHIELD

## 2018-06-09 DIAGNOSIS — R748 Abnormal levels of other serum enzymes: Secondary | ICD-10-CM | POA: Diagnosis not present

## 2018-06-09 LAB — HEPATIC FUNCTION PANEL
ALT: 57 U/L — ABNORMAL HIGH (ref 0–35)
AST: 41 U/L — ABNORMAL HIGH (ref 0–37)
Albumin: 4.1 g/dL (ref 3.5–5.2)
Alkaline Phosphatase: 158 U/L — ABNORMAL HIGH (ref 39–117)
Bilirubin, Direct: 0.1 mg/dL (ref 0.0–0.3)
Total Bilirubin: 0.7 mg/dL (ref 0.2–1.2)
Total Protein: 7.6 g/dL (ref 6.0–8.3)

## 2018-07-16 ENCOUNTER — Encounter (INDEPENDENT_AMBULATORY_CARE_PROVIDER_SITE_OTHER): Payer: Self-pay

## 2018-07-16 ENCOUNTER — Other Ambulatory Visit (INDEPENDENT_AMBULATORY_CARE_PROVIDER_SITE_OTHER): Payer: BLUE CROSS/BLUE SHIELD

## 2018-07-16 ENCOUNTER — Encounter: Payer: Self-pay | Admitting: Gastroenterology

## 2018-07-16 ENCOUNTER — Ambulatory Visit: Payer: BLUE CROSS/BLUE SHIELD | Admitting: Gastroenterology

## 2018-07-16 VITALS — BP 100/68 | HR 64 | Ht 61.0 in | Wt 115.8 lb

## 2018-07-16 DIAGNOSIS — R748 Abnormal levels of other serum enzymes: Secondary | ICD-10-CM | POA: Diagnosis not present

## 2018-07-16 LAB — HEPATIC FUNCTION PANEL
ALT: 129 U/L — ABNORMAL HIGH (ref 0–35)
AST: 79 U/L — ABNORMAL HIGH (ref 0–37)
Albumin: 4.3 g/dL (ref 3.5–5.2)
Alkaline Phosphatase: 182 U/L — ABNORMAL HIGH (ref 39–117)
Bilirubin, Direct: 0.1 mg/dL (ref 0.0–0.3)
Total Bilirubin: 0.6 mg/dL (ref 0.2–1.2)
Total Protein: 7.7 g/dL (ref 6.0–8.3)

## 2018-07-16 LAB — FERRITIN: Ferritin: 243.5 ng/mL (ref 10.0–291.0)

## 2018-07-16 NOTE — Patient Instructions (Signed)
If you are age 54 or older, your body mass index should be between 23-30. Your Body mass index is 21.88 kg/m. If this is out of the aforementioned range listed, please consider follow up with your Primary Care Provider.  If you are age 100 or younger, your body mass index should be between 19-25. Your Body mass index is 21.88 kg/m. If this is out of the aformentioned range listed, please consider follow up with your Primary Care Provider.    Please go to the lab in the basement of our building to have lab work done as you leave today. Hit "B" for basement when you get on the elevator.  When the doors open the lab is on your left.  We will call you with the results. Thank you.   Thank you for entrusting me with your care and for choosing [facility], Dr. [place] Cellar

## 2018-07-16 NOTE — Addendum Note (Signed)
Addended by: Trenda Moots on: 79/72/8206 10:02 AM   Modules accepted: Orders

## 2018-07-16 NOTE — Progress Notes (Signed)
HPI :  52 y/o female here for a follow up visit. See prior consultation note for full intake history.  She was previously admitted to [facility] in February with myalgias and respiratory failure, multiple days in the ICU, was in shock, found to have transaminitis at that time to 500s. Thought perhaps due to shock liver or acute infection, although none was found. Thought that she may have had a flare of her autoimmune disease which caused it.  Since our last visit this summer she had been doing well. Repeat LFTs at that time were normal, we had recommended some interval follow up labs. She had repeat LFTs about 4-5 weeks ago showing AP elevated to 158, ALT to 57, and ALT to 41. She denies any abdominal pains. She generally feels well. She continues to take Morrie Sheldon which is her only new medication this year. She started on this prior to her hospitalization earlier in the year, she wonders if related. She previously has had a positive ANA, but negative IgG, SMA, viral hep panel. She denies any alcohol use. She denies any FH of liver disease. Prior US showed adenomyomatosis of the gallbladder, no stones or other problems.   Of note she does endorse having a prior colonoscopy for screening purposes and states was normal. She is not sure where this was done. We have tried to obtain records but not successful.   RUQ [place] 10/25/2017 - adenomyomatosis of the gallbladder, no stones, no focal abnormality of the liver otherwise HIDA scan 09/30/2017 - normal CT abdomen / pelvis with contrast 09/28/2017 - normal liver, axillary lymphadenopathy, thickening of apex of GB    Past Medical History:  Diagnosis Date  . Adenomyomatosis of gallbladder 10/2017   Abd U/S  . Arthritis   . Disturbance of skin sensation   . Headache   . Migraine without aura, without mention of intractable migraine without mention of status migrainosus 04/22/2014  . Rheumatoid arthritis [facility])      Past Surgical History:  Procedure  Laterality Date  . CARPAL TUNNEL RELEASE Right    Family History  Problem Relation Age of Onset  . Migraines Mother   . Migraines Sister   . Dementia Brother   . Migraines Brother   . Migraines Brother   . Cancer - Lung Brother    Social History   Tobacco Use  . Smoking status: Never Smoker  . Smokeless tobacco: Never Used  Substance Use Topics  . Alcohol use: No  . Drug use: No   Current Outpatient Medications  Medication Sig Dispense Refill  . amitriptyline (ELAVIL) 25 MG tablet Take 1 tablet (25 mg total) by mouth at bedtime. 30 tablet 4  . butalbital-acetaminophen-caffeine (FIORICET, ESGIC) 50-325-40 MG tablet Take 1-2 tablets by mouth every 6 (six) hours as needed for headache or migraine. 30 tablet 0  . eletriptan (RELPAX) 40 MG tablet Take 1 tablet (40 mg total) by mouth 2 (two) times daily as needed. (Patient taking differently: Take 40 mg by mouth 2 (two) times daily as needed for migraine. ) 10 tablet 5  . predniSONE (DELTASONE) 10 MG tablet Take 2 tablets (20 mg total) by mouth daily. Take 20mg  daily until FU with Dr.Beekman 30 tablet 0  . Tofacitinib Citrate (XELJANZ PO) Take by mouth.     No current facility-administered medications for this visit.    No Known Allergies   Review of Systems: All systems reviewed and negative except where noted in HPI.   Lab Results  Component Value Date   WBC 4.2 03/12/2018   HGB 12.9 03/12/2018   HCT 38.4 03/12/2018   MCV 91.9 03/12/2018   PLT 228.0 03/12/2018    Lab Results  Component Value Date   CREATININE 0.74 03/12/2018   BUN 15 03/12/2018   NA 137 03/12/2018   K 4.8 03/12/2018   CL 105 03/12/2018   CO2 27 03/12/2018    Lab Results  Component Value Date   ALT 57 (H) 06/09/2018   AST 41 (H) 06/09/2018   ALKPHOS 158 (H) 06/09/2018   BILITOT 0.7 06/09/2018     Physical Exam: BP 100/68   Pulse 64   Ht 5\' 1"  (1.549 m)   Wt 115 lb 12.8 oz (52.5 kg)   BMI 21.88 kg/m  Constitutional:  Pleasant,well-developed, female in no acute distress. HEENT: Normocephalic and atraumatic. Conjunctivae are normal. No scleral icterus. Neck supple.  Cardiovascular: Normal rate, regular rhythm.  Pulmonary/chest: Effort normal and breath sounds normal. No wheezing, rales or rhonchi. Abdominal: Soft, nondistended, nontender. There are no masses palpable. No hepatomegaly. Extremities: no edema Lymphadenopathy: No cervical adenopathy noted. Neurological: Alert and oriented to person place and time. Skin: Skin is warm and dry. No rashes noted. Psychiatric: Normal mood and affect. Behavior is normal.   ASSESSMENT AND PLAN: 52 y/o female here for reassessment of the following procedures:  Elevated liver enzymes - history as above, initially had marked transaminitis in the setting of acute illness, not sure if that was related to shock liver in the setting of hypotension / septic shock during her acute illness versus viral illness or DILI. Her LFTs intervally normalized however now have risen again with mild transaminitis and elevation of alkaline phosphatase. I discussed ddx with her. Will repeat LFTs today to see where this is trending. Will send the rest of labs to rule out other chronic liver diseases including AMA and anti-liver kidney AB. If these are negative and elevation persists, it is otherwise possible this could be related to Morrie Sheldon which can cause elevation in liver enzymes, and would need to discuss possibly holding this with her Rheumatologist, versus consideration for liver biopsy pending her course. She agreed with the plan and will let her know results of the labs.   Hopewell Cellar, MD [facility]  CC: Azzie Glatter, FNP

## 2018-07-17 LAB — IRON AND TIBC
Iron Saturation: 21 % (ref 15–55)
Iron: 52 ug/dL (ref 27–159)
Total Iron Binding Capacity: 250 ug/dL (ref 250–450)
UIBC: 198 ug/dL (ref 131–425)

## 2018-07-22 ENCOUNTER — Other Ambulatory Visit: Payer: Self-pay

## 2018-07-22 DIAGNOSIS — R7989 Other specified abnormal findings of blood chemistry: Secondary | ICD-10-CM

## 2018-07-22 DIAGNOSIS — R945 Abnormal results of liver function studies: Principal | ICD-10-CM

## 2018-07-22 LAB — HEPATITIS B SURFACE ANTIBODY,QUALITATIVE: Hep B S Ab: REACTIVE — AB

## 2018-07-22 LAB — HEPATITIS A ANTIBODY, TOTAL: Hepatitis A AB,Total: REACTIVE — AB

## 2018-07-22 LAB — ALPHA-1-ANTITRYPSIN: A-1 Antitrypsin, Ser: 124 mg/dL (ref 83–199)

## 2018-07-22 LAB — ANTI-MICROSOMAL ANTIBODY LIVER / KIDNEY: LKM1 Ab: <=20 U (ref <=20.0)

## 2018-07-22 LAB — MITOCHONDRIAL ANTIBODIES: Mitochondrial M2 Ab, IgG: <=20 U

## 2018-07-22 LAB — CERULOPLASMIN: Ceruloplasmin: 32 mg/dL (ref 18–53)

## 2018-10-14 ENCOUNTER — Ambulatory Visit: Payer: BLUE CROSS/BLUE SHIELD | Admitting: Family Medicine

## 2020-02-04 ENCOUNTER — Encounter (HOSPITAL_COMMUNITY): Payer: Self-pay | Admitting: Emergency Medicine

## 2020-02-04 ENCOUNTER — Other Ambulatory Visit: Payer: Self-pay

## 2020-02-04 ENCOUNTER — Emergency Department (HOSPITAL_COMMUNITY)
Admission: EM | Admit: 2020-02-04 | Discharge: 2020-02-04 | Disposition: A | Discharge status: Home / Self Care | Payer: BLUE CROSS/BLUE SHIELD | Attending: Emergency Medicine | Admitting: Emergency Medicine

## 2020-02-04 DIAGNOSIS — R42 Dizziness and giddiness: Secondary | ICD-10-CM

## 2020-02-04 DIAGNOSIS — R5383 Other fatigue: Secondary | ICD-10-CM | POA: Insufficient documentation

## 2020-02-04 DIAGNOSIS — R21 Rash and other nonspecific skin eruption: Secondary | ICD-10-CM | POA: Insufficient documentation

## 2020-02-04 LAB — BASIC METABOLIC PANEL
Anion gap: 8 (ref 5–15)
BUN: 6 mg/dL (ref 6–20)
CO2: 24 mmol/L (ref 22–32)
Calcium: 8.2 mg/dL — ABNORMAL LOW (ref 8.9–10.3)
Chloride: 105 mmol/L (ref 98–111)
Creatinine, Ser: 0.51 mg/dL (ref 0.44–1.00)
GFR calc Af Amer: >60 mL/min (ref >60)
GFR calc non Af Amer: >60 mL/min (ref >60)
Glucose, Bld: 102 mg/dL — ABNORMAL HIGH (ref 70–99)
Potassium: 4.7 mmol/L (ref 3.5–5.1)
Sodium: 137 mmol/L (ref 135–145)

## 2020-02-04 LAB — CBC
HCT: 38.8 % (ref 36.0–46.0)
Hemoglobin: 12.6 g/dL (ref 12.0–15.0)
MCH: 29.8 pg (ref 26.0–34.0)
MCHC: 32.5 g/dL (ref 30.0–36.0)
MCV: 91.7 fL (ref 80.0–100.0)
Platelets: 127 10*3/uL — ABNORMAL LOW (ref 150–400)
RBC: 4.23 MIL/uL (ref 3.87–5.11)
RDW: 14 % (ref 11.5–15.5)
WBC: 2.8 10*3/uL — ABNORMAL LOW (ref 4.0–10.5)
nRBC: 0 % (ref 0.0–0.2)

## 2020-02-04 LAB — CK: Total CK: 144 U/L (ref 38–234)

## 2020-02-04 MED ORDER — SODIUM CHLORIDE 0.9 % IV BOLUS
1000.0000 mL | Freq: Once | INTRAVENOUS | Status: AC
  Administered 2020-02-04: 1000 mL via INTRAVENOUS

## 2020-02-04 MED ORDER — PREDNISONE 10 MG (21) PO TBPK
ORAL_TABLET | Freq: Every day | ORAL | 0 refills | Status: DC
Start: 2020-02-04 — End: 2020-03-23

## 2020-02-04 MED ORDER — MECLIZINE HCL 25 MG PO TABS
25.0000 mg | ORAL_TABLET | Freq: Three times a day (TID) | ORAL | 0 refills | Status: DC | PRN
Start: 2020-02-04 — End: 2020-03-14

## 2020-02-04 MED ORDER — SODIUM CHLORIDE 0.9% FLUSH: 3.0000 mL | Freq: Once | INTRAVENOUS | Status: DC

## 2020-02-04 MED ORDER — MECLIZINE HCL 25 MG PO TABS
25.0000 mg | ORAL_TABLET | Freq: Once | ORAL | Status: AC
  Administered 2020-02-04: 25 mg via ORAL
  Filled 2020-02-04: qty 1

## 2020-02-04 NOTE — Discharge Instructions (Addendum)
Take the meclizine as needed. Follow-up with your primary care provider and the dermatologist listed below. Return to the ER if you start to experience worsening dizziness, headache, blurry vision, numbness in arms or legs, neck stiffness or fever.

## 2020-02-04 NOTE — ED Triage Notes (Signed)
Pt c/o generalized weakness, loss of appetite and weight loss since getting the covid vaccine x 3 weeks ago. Pt reports a rash developed to her face this morning. NKA, denies shortness of breath.

## 2020-02-04 NOTE — ED Provider Notes (Signed)
[facility] Provider Note   CSN: [id number] Arrival date & time: 02/04/20  1500     History Chief Complaint  Patient presents with  . Fatigue  . Rash    Kathleen Costa is a 54 y.o. female with a past medical history of migraines, rheumatoid arthritis, presenting to the ED with a chief complaint of fatigue, rash, headache and dizziness.  Received her second Covid vaccine 3 weeks ago.  Since then she has been having fatigue, decreased appetite.  Approximately 2 weeks ago started having a rash in both of her arms.  She then noticed a rash on her face for the past 2 days.  She has not been taking any medications to help with the rash.  States that she initially had itching but this gradually improved.  She reports dizziness, feeling like the room is spinning around her when she ambulates, as well as an intermittent headache.  She denies numbness in arms or legs, vision changes, vomiting, chest pain, shortness of breath, abdominal pain, injuries or falls.  She cannot recall any other triggers such as new lotions, soaps or lotions that may have triggered her rash.  She was mowing her grass last week and is unsure if she had gotten into poison ivy.  HPI     Past Medical History:  Diagnosis Date  . Adenomyomatosis of gallbladder 10/2017   Abd U/S  . Arthritis   . Disturbance of skin sensation   . Headache   . Migraine without aura, without mention of intractable migraine without mention of status migrainosus 04/22/2014  . Rheumatoid arthritis [facility])     Patient Active Problem List   Diagnosis Date Noted  . Acute metabolic encephalopathy 02/63/7858  . Myalgia 10/01/2017  . Polyarthritis 10/01/2017  . Paresthesias 10/01/2017  . Acute hypoxemic respiratory failure ([place]) 10/01/2017  . Thickening of wall of gallbladder 10/01/2017  . Elevated liver enzymes 10/01/2017  . Hypokalemia 10/01/2017  . Lymphadenopathy 09/28/2017  . Common migraine with  intractable migraine 04/22/2014    Past Surgical History:  Procedure Laterality Date  . CARPAL TUNNEL RELEASE Right      OB History   No obstetric history on file.     Family History  Problem Relation Age of Onset  . Migraines Mother   . Migraines Sister   . Dementia Brother   . Migraines Brother   . Migraines Brother   . Cancer - Lung Brother     Social History   Tobacco Use  . Smoking status: Never Smoker  . Smokeless tobacco: Never Used  Vaping Use  . Vaping Use: Never used  Substance Use Topics  . Alcohol use: No  . Drug use: No    Home Medications Prior to Admission medications   Medication Sig Start Date End Date Taking? Authorizing Provider  acetaminophen (TYLENOL) 650 MG CR tablet Take 650 mg by mouth every 8 (eight) hours as needed for pain.   Yes [provider]  Naphazoline HCl (CLEAR EYES OP) Place 1 drop into both eyes daily as needed (For dry eyes).   Yes [provider]  predniSONE (DELTASONE) 10 MG tablet Take 2 tablets (20 mg total) by mouth daily. Take 20mg  daily until FU with Dr.Beekman 10/03/17  Yes Domenic Polite, MD  amitriptyline (ELAVIL) 25 MG tablet Take 1 tablet (25 mg total) by mouth at bedtime. Patient not taking: Reported on 02/04/2020 09/17/17   [name], MD  butalbital-acetaminophen-caffeine (FIORICET, ESGIC) [phone] MG tablet  Take 1-2 tablets by mouth every 6 (six) hours as needed for headache or migraine. Patient not taking: Reported on 02/04/2020 10/03/17   Domenic Polite, MD  eletriptan (RELPAX) 40 MG tablet Take 1 tablet (40 mg total) by mouth 2 (two) times daily as needed. Patient not taking: Reported on 02/04/2020 04/22/14   [name], MD    Allergies    Patient has no known allergies.  Review of Systems   Review of Systems  Constitutional: Positive for appetite change and fatigue. Negative for chills and fever.  HENT: Negative for ear pain, rhinorrhea, sneezing and sore throat.   Eyes:  Negative for photophobia and visual disturbance.  Respiratory: Negative for cough, chest tightness, shortness of breath and wheezing.   Cardiovascular: Negative for chest pain and palpitations.  Gastrointestinal: Negative for abdominal pain, blood in stool, constipation, diarrhea, nausea and vomiting.  Genitourinary: Negative for dysuria, hematuria and urgency.  Musculoskeletal: Negative for myalgias.  Skin: Positive for rash.  Neurological: Positive for dizziness. Negative for weakness and light-headedness.    Physical Exam Updated Vital Signs BP (!) 112/95   Pulse 89   Temp 98.1 F (36.7 C) (Oral)   Resp (!) 23   Ht 5\' 1"  (1.549 m)   Wt 47.6 kg   SpO2 100%   BMI 19.84 kg/m   Physical Exam Vitals and nursing note reviewed.  Constitutional:      General: She is not in acute distress.    Appearance: She is well-developed.     Comments: Speaking in complete sentence without difficulty.  No signs of angioedema or anaphylaxis.  HENT:     Head: Normocephalic and atraumatic.     Nose: Nose normal.  Eyes:     General: No scleral icterus.       Left eye: No discharge.     Conjunctiva/sclera: Conjunctivae normal.  Cardiovascular:     Rate and Rhythm: Normal rate and regular rhythm.     Heart sounds: Normal heart sounds. No murmur heard.  No friction rub. No gallop.   Pulmonary:     Effort: Pulmonary effort is normal. No respiratory distress.     Breath sounds: Normal breath sounds.  Abdominal:     General: Bowel sounds are normal. There is no distension.     Palpations: Abdomen is soft.     Tenderness: There is no abdominal tenderness. There is no guarding.  Musculoskeletal:        General: Normal range of motion.     Cervical back: Normal range of motion and neck supple.  Skin:    General: Skin is warm and dry.     Findings: Rash present.     Comments: Several erythematous papular areas noted to bilateral arms.  There appears to be an erythematous scaly rash to her  forehead.  No pustules or vesicles noted.  Neurological:     General: No focal deficit present.     Mental Status: She is alert and oriented to person, place, and time.     Cranial Nerves: No cranial nerve deficit.     Sensory: No sensory deficit.     Motor: No abnormal muscle tone.     Coordination: Coordination normal.     Comments: Pupils reactive. No facial asymmetry noted. Cranial nerves appear grossly intact. Sensation intact to light touch on face, BUE and BLE. Strength 5/5 in BUE and BLE. No pronator drift. Normal finger to nose coordination bilaterally.     ED Results / Procedures /  Treatments   Labs (all labs ordered are listed, but only abnormal results are displayed) Labs Reviewed  BASIC METABOLIC PANEL - Abnormal; Notable for the following components:      Result Value   Glucose, Bld 102 (*)    Calcium 8.2 (*)    All other components within normal limits  CBC - Abnormal; Notable for the following components:   WBC 2.8 (*)    Platelets 127 (*)    All other components within normal limits  CK    EKG None  Radiology No results found.  Procedures Procedures (including critical care time)  Medications Ordered in ED Medications  sodium chloride flush (NS) 0.9 % injection 3 mL (has no administration in time range)  sodium chloride 0.9 % bolus 1,000 mL (1,000 mLs Intravenous New Bag/Given 02/04/20 2036)  meclizine (ANTIVERT) tablet 25 mg (25 mg Oral Given 02/04/20 2032)    ED Course  I have reviewed the triage vital signs and the nursing notes.  Pertinent labs & imaging results that were available during my care of the patient were reviewed by me and considered in my medical decision making (see chart for details).  Clinical Course as of Feb 04 2127  Thu Feb 04, 2020  2118 Patient declining CT of the head.   [HK]    Clinical Course User Index [HK] Delia Heady, [name]   MDM Rules/Calculators/A&P                          54 year old female with past medical  history of migraines presenting to the ED with a chief complaint of fatigue, rash, headache and dizziness.  Symptoms began after she received her second Covid vaccine 3 weeks ago.  2 weeks ago started having a rash in both of her arms which is progressed to a rash on her face.  She is concerned this could be due to poison ivy.  She reports dizziness and feeling the room is spinning around her when she tries to ambulate as well as headache.  She denies any  vision changes, vomiting, abdominal pain, chest pain.  On exam there appears to be an erythematous, papular rash noted to bilateral arms scant.  She has a scaly, erythematous rash to her forehead which she states has been itching.  This has been new since yesterday.  No neurological deficit noted on exam.  She is alert and oriented.  No changes to strength and sensation noted.  No meningeal signs.  Initially obtained a CT of the head as she reports this headache is different than her migraines.  She declined this.  Patient was given IV fluids and meclizine here with improvement in symptoms.  Suspect that her rash could be due to a contact dermatitis.  Pt has a patent airway without stridor and is handling secretions without difficulty; no angioedema. No blisters, no pustules, no warmth, no draining sinus tracts, no superficial abscesses, no bullous impetigo, no vesicles, no desquamation, no target lesions with dusky purpura or a central bulla. Not tender to touch. No concern for superimposed infection. No concern for SJS, TEN, TSS, tick borne illness, syphilis or other life-threatening condition. Will treat with steroids to help with rash, continued meclizine to help with vertigo.  I have low suspicion for CVA, TIA or other emergent cause of her symptoms.  Patient is comfortable with discharge home and is requesting referral to a PCP.    Patient is hemodynamically stable, in NAD, and able  to ambulate in the ED. Evaluation does not show pathology that would  require ongoing emergent intervention or inpatient treatment. I explained the diagnosis to the patient. Pain has been managed and has no complaints prior to discharge. Patient is comfortable with above plan and is stable for discharge at this time. All questions were answered prior to disposition. Strict return precautions for returning to the ED were discussed. Encouraged follow up with PCP.   An After Visit Summary was printed and given to the patient.   Portions of this note were generated with [employer]. Dictation errors may occur despite best attempts at proofreading.  Final Clinical Impression(s) / ED Diagnoses Final diagnoses:  Rash and nonspecific skin eruption  Vertigo    Rx / DC Orders ED Discharge Orders    None       Lulu Riding 02/04/20 2128    Carmin Muskrat, MD 02/04/20 2310

## 2020-02-09 ENCOUNTER — Ambulatory Visit: Payer: Self-pay | Admitting: Internal Medicine

## 2020-03-08 ENCOUNTER — Emergency Department (HOSPITAL_COMMUNITY)
Admission: EM | Admit: 2020-03-08 | Discharge: 2020-03-08 | Disposition: A | Discharge status: Home / Self Care | Payer: 59 | Attending: Emergency Medicine | Admitting: Emergency Medicine

## 2020-03-08 ENCOUNTER — Other Ambulatory Visit: Payer: Self-pay

## 2020-03-08 ENCOUNTER — Ambulatory Visit (HOSPITAL_COMMUNITY): Payer: 59

## 2020-03-08 ENCOUNTER — Encounter (HOSPITAL_COMMUNITY): Payer: Self-pay

## 2020-03-08 DIAGNOSIS — B370 Candidal stomatitis: Secondary | ICD-10-CM | POA: Diagnosis not present

## 2020-03-08 DIAGNOSIS — R531 Weakness: Secondary | ICD-10-CM | POA: Diagnosis present

## 2020-03-08 DIAGNOSIS — Z20822 Contact with and (suspected) exposure to covid-19: Secondary | ICD-10-CM | POA: Insufficient documentation

## 2020-03-08 DIAGNOSIS — Z79899 Other long term (current) drug therapy: Secondary | ICD-10-CM | POA: Insufficient documentation

## 2020-03-08 LAB — CBC WITH DIFFERENTIAL/PLATELET
Abs Immature Granulocytes: 0 10*3/uL (ref 0.00–0.07)
Basophils Absolute: 0 10*3/uL (ref 0.0–0.1)
Basophils Relative: 1 %
Eosinophils Absolute: 0 10*3/uL (ref 0.0–0.5)
Eosinophils Relative: 0 %
HCT: 34.7 % — ABNORMAL LOW (ref 36.0–46.0)
Hemoglobin: 11.1 g/dL — ABNORMAL LOW (ref 12.0–15.0)
Immature Granulocytes: 0 %
Lymphocytes Relative: 28 %
Lymphs Abs: 0.6 10*3/uL — ABNORMAL LOW (ref 0.7–4.0)
MCH: 29.4 pg (ref 26.0–34.0)
MCHC: 32 g/dL (ref 30.0–36.0)
MCV: 91.8 fL (ref 80.0–100.0)
Monocytes Absolute: 0.1 10*3/uL (ref 0.1–1.0)
Monocytes Relative: 6 %
Neutro Abs: 1.5 10*3/uL — ABNORMAL LOW (ref 1.7–7.7)
Neutrophils Relative %: 65 %
Platelets: 136 10*3/uL — ABNORMAL LOW (ref 150–400)
RBC: 3.78 MIL/uL — ABNORMAL LOW (ref 3.87–5.11)
RDW: 15 % (ref 11.5–15.5)
Smear Review: NORMAL
WBC: 2.2 10*3/uL — ABNORMAL LOW (ref 4.0–10.5)
nRBC: 0 % (ref 0.0–0.2)

## 2020-03-08 LAB — URINALYSIS, ROUTINE W REFLEX MICROSCOPIC
Bacteria, UA: NONE SEEN
Bilirubin Urine: NEGATIVE
Glucose, UA: NEGATIVE mg/dL
Hgb urine dipstick: NEGATIVE
Ketones, ur: NEGATIVE mg/dL
Leukocytes,Ua: NEGATIVE
Nitrite: NEGATIVE
Protein, ur: >=300 mg/dL — AB
Specific Gravity, Urine: 1.018 (ref 1.005–1.030)
pH: 6 (ref 5.0–8.0)

## 2020-03-08 LAB — LIPASE, BLOOD: Lipase: 29 U/L (ref 11–51)

## 2020-03-08 LAB — COMPREHENSIVE METABOLIC PANEL
ALT: 112 U/L — ABNORMAL HIGH (ref 0–44)
AST: 282 U/L — ABNORMAL HIGH (ref 15–41)
Albumin: 1.9 g/dL — ABNORMAL LOW (ref 3.5–5.0)
Alkaline Phosphatase: 225 U/L — ABNORMAL HIGH (ref 38–126)
Anion gap: 8 (ref 5–15)
BUN: 6 mg/dL (ref 6–20)
CO2: 25 mmol/L (ref 22–32)
Calcium: 7.7 mg/dL — ABNORMAL LOW (ref 8.9–10.3)
Chloride: 104 mmol/L (ref 98–111)
Creatinine, Ser: 0.5 mg/dL (ref 0.44–1.00)
GFR calc Af Amer: >60 mL/min (ref >60)
GFR calc non Af Amer: >60 mL/min (ref >60)
Glucose, Bld: 127 mg/dL — ABNORMAL HIGH (ref 70–99)
Potassium: 3.6 mmol/L (ref 3.5–5.1)
Sodium: 137 mmol/L (ref 135–145)
Total Bilirubin: 0.3 mg/dL (ref 0.3–1.2)
Total Protein: 5.9 g/dL — ABNORMAL LOW (ref 6.5–8.1)

## 2020-03-08 LAB — I-STAT BETA HCG BLOOD, ED (MC, WL, AP ONLY): I-stat hCG, quantitative: <5 m[IU]/mL (ref <5)

## 2020-03-08 LAB — LACTIC ACID, PLASMA: Lactic Acid, Venous: 1.1 mmol/L (ref 0.5–1.9)

## 2020-03-08 LAB — TROPONIN I (HIGH SENSITIVITY): Troponin I (High Sensitivity): 12 ng/L (ref <18)

## 2020-03-08 LAB — SARS CORONAVIRUS 2 BY RT PCR (HOSPITAL ORDER, PERFORMED IN ~~LOC~~ HOSPITAL LAB): SARS Coronavirus 2: NEGATIVE

## 2020-03-08 MED ORDER — LIDOCAINE VISCOUS HCL 2 % MT SOLN
15.0000 mL | Freq: Once | OROMUCOSAL | Status: AC
  Administered 2020-03-08: 15 mL via OROMUCOSAL
  Filled 2020-03-08: qty 15

## 2020-03-08 MED ORDER — FLUCONAZOLE 150 MG PO TABS
150.0000 mg | ORAL_TABLET | Freq: Once | ORAL | Status: AC
  Administered 2020-03-08: 150 mg via ORAL
  Filled 2020-03-08: qty 1

## 2020-03-08 MED ORDER — SODIUM CHLORIDE 0.9 % IV BOLUS
1000.0000 mL | Freq: Once | INTRAVENOUS | Status: AC
  Administered 2020-03-08: 1000 mL via INTRAVENOUS

## 2020-03-08 MED ORDER — FLUCONAZOLE 200 MG PO TABS
200.0000 mg | ORAL_TABLET | Freq: Every day | ORAL | 0 refills | Status: DC
Start: 2020-03-08 — End: 2020-03-23

## 2020-03-08 NOTE — Discharge Instructions (Addendum)
The sores in your mouth are likely contributing to difficulty eating.  They appear to be from a yeast infection, called thrush.  You also likely have it in your esophagus making swallowing difficult and causing pain.  The medicine, Diflucan, should help you get better after a few days.  Take it for the full week.  A prescription was sent to your pharmacy.  It will also help to use Pepto-Bismol, liquid, swishing in your mouth and then swallowing, 3-4 times a day.  This can take away her discomfort and allow you to eat better.  Hopefully this treatment will resolve all of your symptoms.  It is important to follow-up with your doctor for further evaluation and treatment, as needed.

## 2020-03-08 NOTE — ED Provider Notes (Signed)
[facility] EMERGENCY DEPARTMENT Provider Note   CSN: [id number] Arrival date & time: 03/08/20  1101     History Chief Complaint  Patient presents with  . Weakness  . Chest Pain    Kathleen Costa is a 54 y.o. female.  HPI She presents for evaluation of weakness with nausea, decreased appetite.  Symptoms ongoing for several months, since her Covid vaccine in March 2021.Marland Kitchen  She has previously been evaluated for same.  No definitive diagnosis or treatment was made/given.  She feels like she is losing weight and feeling dizzy when she stands up.  Now she states she cannot swallow "anything."  No known sick contacts.  She denies cough, shortness of breath, focal weakness or paresthesia. There are no other known modifying factors.   Past Medical History:  Diagnosis Date  . Adenomyomatosis of gallbladder 10/2017   Abd U/S  . Arthritis   . Disturbance of skin sensation   . Headache   . Migraine without aura, without mention of intractable migraine without mention of status migrainosus 04/22/2014  . Rheumatoid arthritis [facility])     Patient Active Problem List   Diagnosis Date Noted  . Acute metabolic encephalopathy 49/70/2637  . Myalgia 10/01/2017  . Polyarthritis 10/01/2017  . Paresthesias 10/01/2017  . Acute hypoxemic respiratory failure ([place]) 10/01/2017  . Thickening of wall of gallbladder 10/01/2017  . Elevated liver enzymes 10/01/2017  . Hypokalemia 10/01/2017  . Lymphadenopathy 09/28/2017  . Common migraine with intractable migraine 04/22/2014    Past Surgical History:  Procedure Laterality Date  . CARPAL TUNNEL RELEASE Right      OB History   No obstetric history on file.     Family History  Problem Relation Age of Onset  . Migraines Mother   . Migraines Sister   . Dementia Brother   . Migraines Brother   . Migraines Brother   . Cancer - Lung Brother     Social History   Tobacco Use  . Smoking status: Never Smoker  . Smokeless  tobacco: Never Used  Vaping Use  . Vaping Use: Never used  Substance Use Topics  . Alcohol use: No  . Drug use: No    Home Medications Prior to Admission medications   Medication Sig Start Date End Date Taking? Authorizing Provider  acetaminophen (TYLENOL) 650 MG CR tablet Take 650 mg by mouth every 8 (eight) hours as needed for pain.    [provider]  amitriptyline (ELAVIL) 25 MG tablet Take 1 tablet (25 mg total) by mouth at bedtime. Patient not taking: Reported on 02/04/2020 09/17/17   [name], MD  butalbital-acetaminophen-caffeine (FIORICET, ESGIC) [id number] MG tablet Take 1-2 tablets by mouth every 6 (six) hours as needed for headache or migraine. Patient not taking: Reported on 02/04/2020 10/03/17   Domenic Polite, MD  eletriptan (RELPAX) 40 MG tablet Take 1 tablet (40 mg total) by mouth 2 (two) times daily as needed. Patient not taking: Reported on 02/04/2020 04/22/14   [name], MD  fluconazole (DIFLUCAN) 200 MG tablet Take 1 tablet (200 mg total) by mouth daily. 03/08/20   Daleen Bo, MD  meclizine (ANTIVERT) 25 MG tablet Take 1 tablet (25 mg total) by mouth 3 (three) times daily as needed for dizziness. 02/04/20   Khatri, Hina, [name]  Naphazoline HCl (CLEAR EYES OP) Place 1 drop into both eyes daily as needed (For dry eyes).    [provider]  predniSONE (STERAPRED UNI-PAK 21 TAB) 10 MG (21)  TBPK tablet Take by mouth daily. Take 6 tabs by mouth daily  for 2 days, then 5 tabs for 2 days, then 4 tabs for 2 days, then 3 tabs for 2 days, 2 tabs for 2 days, then 1 tab by mouth daily for 2 days 02/04/20   Delia Heady, [name]    Allergies    Patient has no known allergies.  Review of Systems   Review of Systems  All other systems reviewed and are negative.   Physical Exam Updated Vital Signs BP 106/74   Pulse 81   Temp 98.1 F (36.7 C) (Oral)   Resp 18   Ht 5\' 1"  (1.549 m)   Wt 46.7 kg   SpO2 100%   BMI 19.46 kg/m   Physical  Exam Vitals and nursing note reviewed.  Constitutional:      General: She is not in acute distress.    Appearance: She is well-developed. She is not ill-appearing, toxic-appearing or diaphoretic.  HENT:     Head: Normocephalic and atraumatic.     Right Ear: External ear normal.     Left Ear: External ear normal.     Nose: No congestion or rhinorrhea.     Mouth/Throat:     Mouth: Mucous membranes are moist.     Comments: Scattered lesions, cheeks bilaterally, and beneath tongue, superficial redness with slight exudate, on them.  No deep ulcerations. Eyes:     Conjunctiva/sclera: Conjunctivae normal.     Pupils: Pupils are equal, round, and reactive to light.  Neck:     Trachea: Phonation normal.  Cardiovascular:     Rate and Rhythm: Normal rate and regular rhythm.     Heart sounds: Normal heart sounds.  Pulmonary:     Effort: Pulmonary effort is normal.     Breath sounds: Normal breath sounds.  Abdominal:     General: There is no distension.     Palpations: Abdomen is soft.     Tenderness: There is no abdominal tenderness.  Musculoskeletal:        General: Normal range of motion.     Cervical back: Normal range of motion and neck supple.  Skin:    General: Skin is warm and dry.     Coloration: Skin is not jaundiced or pale.  Neurological:     Mental Status: She is alert and oriented to person, place, and time.     Cranial Nerves: No cranial nerve deficit.     Sensory: No sensory deficit.     Motor: No abnormal muscle tone.     Coordination: Coordination normal.  Psychiatric:        Mood and Affect: Mood normal.        Behavior: Behavior normal.        Thought Content: Thought content normal.        Judgment: Judgment normal.     ED Results / Procedures / Treatments   Labs (all labs ordered are listed, but only abnormal results are displayed) Labs Reviewed  CBC WITH DIFFERENTIAL/PLATELET - Abnormal; Notable for the following components:      Result Value   WBC 2.2  (*)    RBC 3.78 (*)    Hemoglobin 11.1 (*)    HCT 34.7 (*)    Platelets 136 (*)    Neutro Abs 1.5 (*)    Lymphs Abs 0.6 (*)    All other components within normal limits  COMPREHENSIVE METABOLIC PANEL - Abnormal; Notable for the following components:  Glucose, Bld 127 (*)    Calcium 7.7 (*)    Total Protein 5.9 (*)    Albumin 1.9 (*)    AST 282 (*)    ALT 112 (*)    Alkaline Phosphatase 225 (*)    All other components within normal limits  URINALYSIS, ROUTINE W REFLEX MICROSCOPIC - Abnormal; Notable for the following components:   Protein, ur >=300 (*)    All other components within normal limits  SARS CORONAVIRUS 2 BY RT PCR (HOSPITAL ORDER, [place] [facility])  LACTIC ACID, PLASMA  LIPASE, BLOOD  I-STAT BETA HCG BLOOD, ED ([facility], [facility], [facility] ONLY)  TROPONIN I (HIGH SENSITIVITY)  TROPONIN I (HIGH SENSITIVITY)    EKG None  Radiology US Abdomen Complete  Result Date: 03/08/2020 CLINICAL DATA:  Abdominal pain, elevated transaminase EXAM: ABDOMEN ULTRASOUND COMPLETE COMPARISON:  10/25/2017 FINDINGS: Gallbladder: Ring down artifact from the anterior gallbladder wall compatible with adenomyomatosis. No stones or sonographic Murphy sign. Common bile duct: Diameter: Normal caliber, 4 mm Liver: No focal lesion identified. Within normal limits in parenchymal echogenicity. Portal vein is patent on color Doppler imaging with normal direction of blood flow towards the liver. IVC: No abnormality visualized. Pancreas: Visualized portion unremarkable. Spleen: Size and appearance within normal limits. Right Kidney: Length: 9.9 cm. Echogenicity within normal limits. No mass or hydronephrosis visualized. Left Kidney: Length: 10.5 cm. Echogenicity within normal limits. No mass or hydronephrosis visualized. Abdominal aorta: No aneurysm visualized. Other findings: None. IMPRESSION: Changes of adenomyomatosis of the gallbladder. No evidence of stones or acute cholecystitis. Electronically  Signed   By: Rolm Baptise M.D.   On: 03/08/2020 16:15    Procedures .Critical Care Performed by: Daleen Bo, MD Authorized by: Daleen Bo, MD   Critical care provider statement:    Critical care time (minutes):  35   Critical care start time:  03/08/2020 6:12 PM   Critical care end time:  03/08/2020 9:32 PM   Critical care time was exclusive of:  Separately billable procedures and treating other patients   Critical care was necessary to treat or prevent imminent or life-threatening deterioration of the following conditions: Anorexia.   Critical care was time spent personally by me on the following activities:  Blood draw for specimens, development of treatment plan with patient or surrogate, discussions with consultants, evaluation of patient's response to treatment, examination of patient, obtaining history from patient or surrogate, ordering and performing treatments and interventions, ordering and review of laboratory studies, pulse oximetry, re-evaluation of patient's condition, review of old charts and ordering and review of radiographic studies   (including critical care time)  Medications Ordered in ED Medications  sodium chloride 0.9 % bolus 1,000 mL (0 mLs Intravenous Stopped 03/08/20 2049)  fluconazole (DIFLUCAN) tablet 150 mg (150 mg Oral Given 03/08/20 2039)  lidocaine (XYLOCAINE) 2 % viscous mouth solution 15 mL (15 mLs Mouth/Throat Given 03/08/20 2039)    ED Course  I have reviewed the triage vital signs and the nursing notes.  Pertinent labs & imaging results that were available during my care of the patient were reviewed by me and considered in my medical decision making (see chart for details).  Clinical Course as of Mar 08 2132  Tue Mar 08, 2020  2129 Appearance: CLEAR [EW]    Clinical Course User Index [EW] Daleen Bo, MD   MDM Rules/Calculators/A&P  Patient Vitals for the past 24 hrs:  BP Temp Temp src Pulse Resp SpO2  Height Weight  03/08/20 2115 106/74 98.1 F (36.7 C) Oral 81 18 100 % -- --  03/08/20 2101 110/76 -- -- 79 -- 100 % -- --  03/08/20 2045 107/75 -- -- 88 (!) 27 96 % -- --  03/08/20 1945 108/69 -- -- 90 (!) 26 100 % -- --  03/08/20 1840 97/66 -- -- (!) 109 (!) 23 100 % -- --  03/08/20 1839 97/71 -- -- 99 (!) 31 100 % -- --  03/08/20 1838 99/62 -- -- 94 (!) 25 98 % -- --  03/08/20 1802 98/74 -- -- (!) 59 17 100 % -- --  03/08/20 1600 90/69 -- -- (!) 107 16 100 % -- --  03/08/20 1122 (!) 101/52 98.2 F (36.8 C) -- (!) 113 16 99 % 5\' 1"  (1.549 m) 46.7 kg    9:22 PM Reevaluation with update and discussion. After initial assessment and treatment, an updated evaluation reveals she is more comfortable after treatment, able to swallow better and feels like she can go home.  Findings discussed and questions answered. Daleen Bo   Medical Decision Making:  This patient is presenting for evaluation of difficulty swallowing, which does require a range of treatment options, and is a complaint that involves a moderate risk of morbidity and mortality. The differential diagnoses include malaise, complication from Covid vaccine, pharyngitis, esophagitis. I decided to review old records, and in summary patient with symptoms following Covid vaccine, several months ago..  I did not require additional historical information from anyone.  Clinical Laboratory Tests Ordered, included CBC, Metabolic panel and Troponin, pregnancy test, Covid test. Review indicates mild leukopenia, hemoglobin slightly low, glucose elevated, calcium low, total protein low, albumin high, nonspecific LFT abnormalities, normal urinalysis. Radiologic Tests Ordered, included ultrasound abdomen.  I independently Visualized: Radiographic images, which show no acute abnormalities    Critical Interventions-clinical evaluation, laboratory testing, ultrasound imaging, treatment for oral esophageal discomfort, observation  reassessment  After These Interventions, the Patient was reevaluated and was found improved, comfortable and able to swallow better.  Evaluation consistent with clinical evidence for oral thrush, and clinically suspect esophageal thrush.  Etiology, unclear.  Patient with this problem, ongoing for several months.  She has nonspecific LFT elevation, and metabolic changes consistent with ongoing decreased oral intake.  Orthostatic vital signs did not show significant orthostasis with standing.  She is stable for discharge.  Further evaluation indicated for ED or hospitalization.  She has access to outpatient evaluation treatment by PCP.  CRITICAL CARE-yes Performed by: Daleen Bo  Nursing Notes Reviewed/ Care Coordinated Applicable Imaging Reviewed Interpretation of Laboratory Data incorporated into ED treatment  The patient appears reasonably screened and/or stabilized for discharge and I doubt any other medical condition or other [facility] requiring further screening, evaluation, or treatment in the ED at this time prior to discharge.  Plan: Home Medications-continue usual prescribed medicines and use Pepto-Bismol for symptomatic treatment; Home Treatments-rest, fluids, advance diet as tolerated; return here if the recommended treatment, does not improve the symptoms; Recommended follow up-PCP follow-up 1 week and as needed.     Final Clinical Impression(s) / ED Diagnoses Final diagnoses:  Thrush    Rx / DC Orders ED Discharge Orders         Ordered    fluconazole (DIFLUCAN) 200 MG tablet  Daily     Discontinue  Reprint     03/08/20 2126  Daleen Bo, MD 03/08/20 2203

## 2020-03-08 NOTE — ED Triage Notes (Addendum)
Pt presents to ED with complaints of generalized weakness, nausea, decreased appetite x 2 weeks. PT states she was just seen for same 2 weeks ago but has not gotten any better. Pt states she did not attempt to follow up with PCP after discharge, but has appointment next month. Pt endorses CP intermittently since last week. No CP at this time

## 2020-03-08 NOTE — ED Notes (Signed)
Discharge instructions reviewed with pt. Pt verbalized understanding.   

## 2020-03-08 NOTE — ED Notes (Signed)
Pt called for vitals x1 no response

## 2020-03-10 ENCOUNTER — Ambulatory Visit (HOSPITAL_COMMUNITY)
Admission: RE | Admit: 2020-03-10 | Discharge: 2020-03-10 | Disposition: A | Discharge status: Home / Self Care | Payer: 59 | Source: Ambulatory Visit | Attending: Internal Medicine | Admitting: Internal Medicine

## 2020-03-10 ENCOUNTER — Other Ambulatory Visit: Payer: Self-pay

## 2020-03-10 ENCOUNTER — Encounter (HOSPITAL_COMMUNITY): Payer: Self-pay

## 2020-03-10 VITALS — BP 105/61 | HR 95 | Temp 98.2°F | Resp 16

## 2020-03-10 DIAGNOSIS — K769 Liver disease, unspecified: Secondary | ICD-10-CM

## 2020-03-10 DIAGNOSIS — K2970 Gastritis, unspecified, without bleeding: Secondary | ICD-10-CM

## 2020-03-10 DIAGNOSIS — K209 Esophagitis, unspecified without bleeding: Secondary | ICD-10-CM

## 2020-03-10 MED ORDER — ALUM & MAG HYDROXIDE-SIMETH 200-200-20 MG/5ML PO SUSP
ORAL | Status: AC
  Filled 2020-03-10: qty 30

## 2020-03-10 MED ORDER — LIDOCAINE VISCOUS HCL 2 % MT SOLN
OROMUCOSAL | Status: AC
  Filled 2020-03-10: qty 15

## 2020-03-10 MED ORDER — ALUM & MAG HYDROXIDE-SIMETH 200-200-20 MG/5ML PO SUSP: 30.0000 mL | Freq: Once | ORAL | Status: DC

## 2020-03-10 MED ORDER — LIDOCAINE VISCOUS HCL 2 % MT SOLN: 15.0000 mL | Freq: Once | OROMUCOSAL | Status: DC

## 2020-03-10 MED ORDER — PANTOPRAZOLE SODIUM 20 MG PO TBEC
20.0000 mg | DELAYED_RELEASE_TABLET | Freq: Every day | ORAL | 1 refills | Status: DC
Start: 2020-03-10 — End: 2020-03-23

## 2020-03-10 NOTE — ED Triage Notes (Signed)
Pt presents to UC for chest pain. Pt seen in [place] on 7/13 diagnosed with oral thrush, given fluconazole. Pt believes hallucinations are r/t new meds. Pt states chest pain is on left side, radiates to abdomen. Pt denies n/v, pt denies diaphoresis, back pain, jaw pain. Pt endorsing dizziness and sob.

## 2020-03-10 NOTE — ED Provider Notes (Signed)
[place]    CSN: [id number] Arrival date & time: 03/10/20  1833      History   Chief Complaint Chief Complaint  Patient presents with  . Appointment  . Chest Pain  . Hallucinations    HPI Kathleen Lavigne is a 54 y.o. female comes to the urgent care with complaints of chest pain. Patient was seen in the clinic was seen in emergency department for similar symptoms. Patient describes chest pain as precordial, sharp and is reproducible with palpation. Pain is nonradiating. She denies any trauma or falls. Pain does not radiate to the jaw or the back. No cough or sputum production. Patient was prescribed fluconazole and she suspects that the fluconazole is giving her some hallucinations. She is not able to expand the issue of hallucinations.  Patient also describes epigastric pain. Pain is aggravated by swallowing and she has a feeling of food being stuck in the chest. No vomiting. No weight loss. No jaundice. No abdominal distention. No history of liver disease. Patient's labs from 7/13 is remarkable for hepatitis with liver dysfunction. No alcohol use. Patient has a history of rheumatoid arthritis.Marland Kitchen   HPI  Past Medical History:  Diagnosis Date  . Adenomyomatosis of gallbladder 10/2017   Abd U/S  . Arthritis   . Disturbance of skin sensation   . Headache   . Migraine without aura, without mention of intractable migraine without mention of status migrainosus 04/22/2014  . Rheumatoid arthritis [facility])     Patient Active Problem List   Diagnosis Date Noted  . Acute metabolic encephalopathy 77/41/2878  . Myalgia 10/01/2017  . Polyarthritis 10/01/2017  . Paresthesias 10/01/2017  . Acute hypoxemic respiratory failure ([place]) 10/01/2017  . Thickening of wall of gallbladder 10/01/2017  . Elevated liver enzymes 10/01/2017  . Hypokalemia 10/01/2017  . Lymphadenopathy 09/28/2017  . Common migraine with intractable migraine 04/22/2014    Past Surgical History:  Procedure  Laterality Date  . CARPAL TUNNEL RELEASE Right     OB History   No obstetric history on file.      Home Medications    Prior to Admission medications   Medication Sig Start Date End Date Taking? Authorizing Provider  fluconazole (DIFLUCAN) 200 MG tablet Take 1 tablet (200 mg total) by mouth daily. 03/08/20   Daleen Bo, MD  meclizine (ANTIVERT) 25 MG tablet Take 1 tablet (25 mg total) by mouth 3 (three) times daily as needed for dizziness. 02/04/20   Khatri, Hina, [name]  Naphazoline HCl (CLEAR EYES OP) Place 1 drop into both eyes daily as needed (For dry eyes).    [provider]  pantoprazole (PROTONIX) 20 MG tablet Take 1 tablet (20 mg total) by mouth daily. 03/10/20   Hurman Ketelsen, Myrene Galas, MD  predniSONE (STERAPRED UNI-PAK 21 TAB) 10 MG (21) TBPK tablet Take by mouth daily. Take 6 tabs by mouth daily  for 2 days, then 5 tabs for 2 days, then 4 tabs for 2 days, then 3 tabs for 2 days, 2 tabs for 2 days, then 1 tab by mouth daily for 2 days 02/04/20   Shelly Coss, Hina, [name]  amitriptyline (ELAVIL) 25 MG tablet Take 1 tablet (25 mg total) by mouth at bedtime. Patient not taking: Reported on 02/04/2020 09/17/17 03/10/20  [name], MD  eletriptan (RELPAX) 40 MG tablet Take 1 tablet (40 mg total) by mouth 2 (two) times daily as needed. Patient not taking: Reported on 02/04/2020 04/22/14 03/10/20  [name], MD    Family  History Family History  Problem Relation Age of Onset  . Migraines Mother   . Migraines Sister   . Dementia Brother   . Migraines Brother   . Migraines Brother   . Cancer - Lung Brother     Social History Social History   Tobacco Use  . Smoking status: Never Smoker  . Smokeless tobacco: Never Used  Vaping Use  . Vaping Use: Never used  Substance Use Topics  . Alcohol use: No  . Drug use: No     Allergies   Patient has no known allergies.   Review of Systems Review of Systems  Constitutional: Positive for fatigue. Negative for activity  change and fever.  HENT: Negative.   Respiratory: Negative.  Negative for chest tightness, shortness of breath and wheezing.   Cardiovascular: Positive for chest pain.  Gastrointestinal: Negative.   Genitourinary: Negative.   Musculoskeletal: Negative.   Psychiatric/Behavioral: Negative for confusion and decreased concentration.     Physical Exam Triage Vital Signs ED Triage Vitals  Enc Vitals Group     BP 03/10/20 1923 105/61     Pulse Rate 03/10/20 1923 95     Resp 03/10/20 1923 16     Temp 03/10/20 1923 98.2 F (36.8 C)     Temp Source 03/10/20 1920 Oral     SpO2 03/10/20 1923 99 %     Weight --      Height --      Head Circumference --      Peak Flow --      Pain Score 03/10/20 1921 2     Pain Loc --      Pain Edu? --      Excl. in [place]? --    No data found.  Updated Vital Signs BP 105/61 (BP Location: Right Arm)   Pulse 95   Temp 98.2 F (36.8 C) (Oral)   Resp 16   SpO2 99%   Visual Acuity Right Eye Distance:   Left Eye Distance:   Bilateral Distance:    Right Eye Near:   Left Eye Near:    Bilateral Near:     Physical Exam Vitals and nursing note reviewed.  Constitutional:      General: She is not in acute distress.    Appearance: She is ill-appearing.  Neck:     Thyroid: No thyromegaly.     Vascular: No hepatojugular reflux.  Cardiovascular:     Rate and Rhythm: Normal rate and regular rhythm.     Heart sounds: Normal heart sounds.  Pulmonary:     Effort: Pulmonary effort is normal.     Breath sounds: No decreased breath sounds or wheezing.  Chest:     Chest wall: Tenderness present. No mass or crepitus.     Comments: Reproducible tenderness in the left costochondral region Abdominal:     General: Bowel sounds are normal. There is no abdominal bruit.     Palpations: There is no hepatomegaly or splenomegaly.  Musculoskeletal:        General: Normal range of motion.     Cervical back: Normal range of motion.  Lymphadenopathy:     Cervical:  No cervical adenopathy.  Neurological:     Mental Status: She is alert.      UC Treatments / Results  Labs (all labs ordered are listed, but only abnormal results are displayed) Labs Reviewed - No data to display  EKG   Radiology No results found.  Procedures Procedures (including critical care  time)  Medications Ordered in UC Medications  alum & mag hydroxide-simeth (MAALOX/MYLANTA) 200-200-20 MG/5ML suspension 30 mL (30 mLs Oral Not Given 03/10/20 2011)    And  lidocaine (XYLOCAINE) 2 % viscous mouth solution 15 mL (15 mLs Oral Not Given 03/10/20 2011)    Initial Impression / Assessment and Plan / UC Course  I have reviewed the triage vital signs and the nursing notes.  Pertinent labs & imaging results that were available during my care of the patient were reviewed by me and considered in my medical decision making (see chart for details).     1. Chest pain likely secondary to costochondritis: Warm compress Patient is counseled not to take more than 2 g of Tylenol a day given the decreased liver function Gentle range of motion exercises.  2. Chronic liver disease: Patient will need gastroenterology evaluation to establish a cause of the chronic liver disease  3. Esophagitis with gastritis: Protonix 20 mg orally daily If no improvement patient will benefit from EGD Patient was given information for gastroenterology and primary care services. Return precautions given. Final Clinical Impressions(s) / UC Diagnoses   Final diagnoses:  Chronic liver disease  Esophagitis with gastritis   Discharge Instructions   None    ED Prescriptions    Medication Sig Dispense Auth. Provider   pantoprazole (PROTONIX) 20 MG tablet Take 1 tablet (20 mg total) by mouth daily. 30 tablet Rendon Howell, Myrene Galas, MD     PDMP not reviewed this encounter.   Chase Picket, MD 03/11/20 [phone]

## 2020-03-10 NOTE — ED Notes (Signed)
MD Lamptey provided EKG

## 2020-03-13 NOTE — Progress Notes (Signed)
03/13/2020 Maury Bamba [id number] 11/04/1965   Chief Complaint: Abnormal liver tests, weight loss, odynophagia   History of Present Illness: Jazman Nom is a 54 year old female with a past medical history of migraine headaches and rheumatoid arthritis followed by rheumatologist Dr. Amil Amen.   She was admitted to [facility] 09/28/2017 with myalgias, bilateral upper and lower extremity weakness and numbness in septic shock. She developed acute transaminitis Alk phos 176. AST 315 -> 646. ALT 253 -> 526. T. Bili 1.4 -> 1.8.  Acute hepatitis panel was negative.  CMV and EBV were not tested.  She developed acute respiratory failure which required intubation and she was admitted to the ICU. A chest CTA was negative for PE. An abdominal/pelvic CT gallbladder wall thickening. HIDA was negative. Surgical intervention was not required. Her hospital course was further complicated with the development of metabolic encephalopathy. A brain MRI was negative. LP was negative. She was seen by neurology without further work up as her neuro status improved. She developed blistering lesions to her wrists, biopsies identified spongiotic dermatitis and she was treated with Prednisone. Her LFTs continued to drift downward at the time of her discharge 10/03/2018.   She was seen in our office by Dr. Havery [name] on 12/25/2017 for further evaluation regarding elevated LFTs following her hospitalization as noted above. Labs 12/25/2017 Alk phos 99. AST 38. ALT 38.  IgG 1596.  It was thought her acute transaminitis during her hospitalization was multifactorial most likely due to septic shock/shock liver. She also reported having dysphagia at that time.  An EGD was deferred due to her recent episode of respiratory failure and a barium swallow was preferred. She completed a barium swallow on 01/02/2018 which showed mild nonspecific esophageal motility disorder with occasional tertiary contractions, otherwise was normal.  She was seen by   Dr. Havery [name] on 03/12/2018 for follow up.  At that time, her swallowing symptoms abated.  Her fatigue continued.  Her LFTs were normal therefore a liver biopsy was deferred.  Laboratory studies 03/12/2018: Alk phos of 84.  AST 22.  ALT 19. IgG and SMA were negative.  She was last seen in our office by Dr. Havery [name] on 07/16/2018.  At that time, her LFTs collected 06/09/2018 showed elevated  alk phos 158,  ALT 57 and ALT 41.  Repeat laboratory studies were done 07/16/2018 which showed Alk phos 182.  AST 79.  ALT 129.  Total bili 0.6.  Iron 52.  Ceruloplasmin 32.  Alpha 1 antitrypsin 124. LKM1 ab < 20. AMA < 20.  It was thought her LFT elevation was possibly due to [place] which was then discontinued.   She presents today accompanied by her daughter for further evaluation regarding persistent elevated LFTs, dysphagia and weight loss. The patient speaks [place], limited English therefore her daughter if interpreting our communication. She presented to [facility] ER on 03/08/2020 with complains to nausea, decreased appetite, dysphagia and weight loss. She reported losing 15lbs over the past 2 to 3 weeks. Labs in the ED showed pancytopenia and elevated LFTs. She was found to have scattered erythematous lesions inside her cheeks and under her tongue. She felt these symptoms worsened after she received her 2nd Covid vaccination on 12/28/2019. She was assessed to have oral thrush most likely due to being on Prednisone for RA. She was prescribed Fluconazole 262m po QD x 7 days and she was discharged home. She developed chest pain and hallucinations and [facility]  Her hallucinations were possibly secondary Fluconazole.  She was diagnosed with costochondritis and esophagitis.  She was prescribed Protonix 20 mg daily and she was advised to follow-up in our office.  Currently, she is alert, mentation is intact without further hallucinations. She continues to have difficulty eating.   She describes having sores in her mouth which make it difficult to eat.  She has pain in her esophagus as she swallows food and liquids.  She has nausea.  No vomiting.  She denies having any upper or lower abdominal pain.  She is passing a normal brown formed bowel movement most days.  No rectal bleeding or melena.  She denies ever having a screening colonoscopy.  No NSAID use.  She is taking Prednisone as prescribed by her rheumatologist for rheumatoid arthritis.  Morrie Sheldon was discontinued 06/2018.  No alcohol use.  She is originally from [place].  She reports having a history of hepatitis B she was diagnosed in the [place] approximately 20 years ago.  She cannot recall if she took any medication for hepatitis B.  Further details are unclear but she is sure she had hepatitis B.  Brother with history of liver cancer, further details are unknown. No family history of esophageal, gastric or colon cancer.     CMP Latest Ref Rng & Units 03/08/2020 02/04/2020 07/16/2018  Glucose 70 - 99 mg/dL 127(H) 102(H) -  BUN 6 - 20 mg/dL 6 6 -  Creatinine 0.44 - 1.00 mg/dL 0.50 0.51 -  Sodium 135 - 145 mmol/L 137 137 -  Potassium 3.5 - 5.1 mmol/L 3.6 4.7 -  Chloride 98 - 111 mmol/L 104 105 -  CO2 22 - 32 mmol/L 25 24 -  Calcium 8.9 - 10.3 mg/dL 7.7(L) 8.2(L) -  Total Protein 6.5 - 8.1 g/dL 5.9(L) - 7.7  Total Bilirubin 0.3 - 1.2 mg/dL 0.3 - 0.6  Alkaline Phos 38 - 126 U/L 225(H) - 182(H)  AST 15 - 41 U/L 282(H) - 79(H)  ALT 0 - 44 U/L 112(H) - 129(H)  Lipase 29.  Sars Corona virus  19: negative  10/29/2018: Hep B surface antigen negative. Hep C antibody negative (done at [facility]).  07/16/2018: Hep A total antibody reactive. Hep B surface antibody reactive. AMA < 20.  LKM1 ab < 20. Iron 52. Ferritin 243.  A1AT 124. Ceruloplasmin 32.   12/25/2017: IgG 1596. SMA < 20.   10/16/2017: Hep C RNA quant negative   10/01/2017: AMA positive. LDH 341. .    Abdominal sonogram 03/08/2020: Changes of adenomyomatosis  of the gallbladder. No evidence of stones or acute cholecystitis. Common bile duct: Diameter: Normal caliber, 4 mm Liver: No focal lesion identified. Within normal limits in parenchymal echogenicity. Portal vein is patent on color Doppler imaging with normal direction of blood flow towards the liver.  Abdominal sonogram 3/012019: showed adenomyomatosis of the gallbladder, no stones or other problems.   Chest CTA and Abdominal/pelvic CT 09/28/2017: No evidence of pulmonary embolus.  Moderate bilateral axillary lymphadenopathy, left greater than right. Further evaluation with focused bilateral axillary ultrasound may be considered to better characterize the morphology of the lymph nodes. Focal gallbladder wall thickening of the apex of the gallbladder. Right upper quadrant ultrasound may be considered for visualization of the gallbladder.  Current Medications, Allergies, Past Medical History, Past Surgical History, Family History and Social History were reviewed in [employer] record.  Review of Systems: See HPI otherwise negative   Physical Exam: BP 100/62   Pulse 96  Ht '5\' 1"'  (1.549 m)   Wt 98 lb 4 oz (44.6 kg)   BMI 18.56 kg/m  General: Thin 54 year old female in no acute distress. Head: Normocephalic and atraumatic. Eyes: No scleral icterus. Conjunctiva pink . Ears: Normal auditory acuity. Mouth: Dentition intact. Erythematous patches to upper palate and underneath tongue.  Lungs: Clear throughout to auscultation. Heart: Regular rate and rhythm, no murmur. Abdomen: Soft, nontender and nondistended. No masses or hepatomegaly. Normal bowel sounds x 4 quadrants.  Rectal: Deferred. Musculoskeletal: Symmetrical with no gross deformities. Extremities: No edema. Neurological: Alert oriented x 4. No focal deficits.  Psychological: Alert and cooperative. Normal mood and affect Skin: No jaundice.   Assessment and Recommendations:  64. 54 year old  female chronic elevated alk phos, AST/ALT levels. Chronic cholestasis without obstruction. Patient reports past history of hepatitis B 20 years ago.  -Abdominal MRI and MRCP w/wo contrast  -Liver biopsy after abdominal MRI/MRCP results reviewed  -Hepatic panel, GGT, Hep B Core ab Total, Hep B core IgM, Hep B surface antigen, Hep B DNA quant, PT/INR, tTg, IgA and AFP  2. Dysphagia and odynophagia  -EGD to rule out candidiasis esophagitis/UGI malignancy/esophagal varices/portal gastropathy. EGD benefits and risks discussed including risk with sedation, risk of bleeding, perforation and infection  -Continue Pantoprazole 84m po daily for now  3. Pancytopenia etiology unclear, most likely due to liver disease/cirrhosis.  WBC 2.2. Hg 11.1. PLT 136. No evidence of cirrhosis per abdominal sonogram 03/08/2020 or CTAP 09/28/2017.  -Hematology consult if the above evaluation negative for cirrhosis  -Iron, iron saturation, TIBC, B12 and folate   5. Adenomyomatosis of the gallbladder  6. Colon cancer screening  -Colonoscopy benefits and risks discussed including risk with sedation, risk of bleeding, perforation and infection   7. Rheumatoid arthritis on Prednisone   8. Hypoalbuminemia 1.9.   9. Weight loss

## 2020-03-14 ENCOUNTER — Encounter: Payer: Self-pay | Admitting: Nurse Practitioner

## 2020-03-14 ENCOUNTER — Other Ambulatory Visit (INDEPENDENT_AMBULATORY_CARE_PROVIDER_SITE_OTHER): Payer: 59

## 2020-03-14 ENCOUNTER — Ambulatory Visit (INDEPENDENT_AMBULATORY_CARE_PROVIDER_SITE_OTHER): Payer: 59 | Admitting: Nurse Practitioner

## 2020-03-14 VITALS — BP 100/62 | HR 96 | Ht 61.0 in | Wt 98.2 lb

## 2020-03-14 DIAGNOSIS — D696 Thrombocytopenia, unspecified: Secondary | ICD-10-CM

## 2020-03-14 DIAGNOSIS — R7989 Other specified abnormal findings of blood chemistry: Secondary | ICD-10-CM

## 2020-03-14 DIAGNOSIS — R634 Abnormal weight loss: Secondary | ICD-10-CM | POA: Diagnosis not present

## 2020-03-14 DIAGNOSIS — D649 Anemia, unspecified: Secondary | ICD-10-CM

## 2020-03-14 LAB — CBC WITH DIFFERENTIAL/PLATELET
Basophils Absolute: 0 10*3/uL (ref 0.0–0.1)
Basophils Relative: 0.9 % (ref 0.0–3.0)
Eosinophils Absolute: 0 10*3/uL (ref 0.0–0.7)
Eosinophils Relative: 0 % (ref 0.0–5.0)
HCT: 36.5 % (ref 36.0–46.0)
Hemoglobin: 12.1 g/dL (ref 12.0–15.0)
Lymphocytes Relative: 28.9 % (ref 12.0–46.0)
Lymphs Abs: 0.8 10*3/uL (ref 0.7–4.0)
MCHC: 33.3 g/dL (ref 30.0–36.0)
MCV: 92 fl (ref 78.0–100.0)
Monocytes Absolute: 0.2 10*3/uL (ref 0.1–1.0)
Monocytes Relative: 5.9 % (ref 3.0–12.0)
Neutro Abs: 1.8 10*3/uL (ref 1.4–7.7)
Neutrophils Relative %: 64.3 % (ref 43.0–77.0)
Platelets: 169 10*3/uL (ref 150.0–400.0)
RBC: 3.97 Mil/uL (ref 3.87–5.11)
RDW: 15.8 % — ABNORMAL HIGH (ref 11.5–15.5)
WBC: 2.8 10*3/uL — ABNORMAL LOW (ref 4.0–10.5)

## 2020-03-14 LAB — BASIC METABOLIC PANEL
BUN: 8 mg/dL (ref 6–23)
CO2: 26 mEq/L (ref 19–32)
Calcium: 8.2 mg/dL — ABNORMAL LOW (ref 8.4–10.5)
Chloride: 105 mEq/L (ref 96–112)
Creatinine, Ser: 0.5 mg/dL (ref 0.40–1.20)
GFR: 127.84 mL/min (ref >60.00)
Glucose, Bld: 146 mg/dL — ABNORMAL HIGH (ref 70–99)
Potassium: 4.5 mEq/L (ref 3.5–5.1)
Sodium: 137 mEq/L (ref 135–145)

## 2020-03-14 LAB — HEPATIC FUNCTION PANEL
ALT: 118 U/L — ABNORMAL HIGH (ref 0–35)
AST: 277 U/L — ABNORMAL HIGH (ref 0–37)
Albumin: 2.3 g/dL — ABNORMAL LOW (ref 3.5–5.2)
Alkaline Phosphatase: 442 U/L — ABNORMAL HIGH (ref 39–117)
Bilirubin, Direct: 0.1 mg/dL (ref 0.0–0.3)
Total Bilirubin: 0.4 mg/dL (ref 0.2–1.2)
Total Protein: 6.4 g/dL (ref 6.0–8.3)

## 2020-03-14 LAB — VITAMIN B12: Vitamin B-12: 1218 pg/mL — ABNORMAL HIGH (ref 211–911)

## 2020-03-14 LAB — FOLATE: Folate: 17 ng/mL (ref >5.9)

## 2020-03-14 LAB — PROTIME-INR
INR: 0.8 ratio (ref 0.8–1.0)
Prothrombin Time: 9.5 s — ABNORMAL LOW (ref 9.6–13.1)

## 2020-03-14 LAB — FERRITIN: Ferritin: >1500 ng/mL — ABNORMAL HIGH (ref 10.0–291.0)

## 2020-03-14 LAB — IGA: IgA: 771 mg/dL — ABNORMAL HIGH (ref 68–378)

## 2020-03-14 NOTE — Patient Instructions (Addendum)
If you are age 56 or older, your body mass index should be between 23-30. Your Body mass index is 18.56 kg/m. If this is out of the aforementioned range listed, please consider follow up with your Primary Care Provider.  If you are age 11 or younger, your body mass index should be between 19-25. Your Body mass index is 18.56 kg/m. If this is out of the aformentioned range listed, please consider follow up with your Primary Care Provider.   You have been scheduled for an MRI at [facility] on 03/24/2020 Your appointment time is 7:30am. Please arrive 30 minutes prior to your appointment time for registration purposes. Please make certain not to have anything to eat or drink 6 hours prior to your test. In addition, if you have any metal in your body, have a pacemaker or defibrillator, please be sure to let your ordering physician know. This test typically takes 45 minutes to 1 hour to complete. Should you need to reschedule, please call [phone] to do so.   Your provider has requested that you go to the basement level for lab work before leaving today. Press "B" on the elevator. The lab is located at the first door on the left as you exit the elevator.  We have given you a sample of plenvu for your Colonoscopy  Due to recent changes in healthcare laws, you may see the results of your imaging and laboratory studies on MyChart before your provider has had a chance to review them.  We understand that in some cases there may be results that are confusing or concerning to you. Not all laboratory results come back in the same time frame and the provider may be waiting for multiple results in order to interpret others.  Please give [place] 48 hours in order for your provider to thoroughly review all the results before contacting the office for clarification of your results.   Thank you for choosing [facility] Noralyn Pick, CRNP

## 2020-03-15 ENCOUNTER — Telehealth: Payer: Self-pay | Admitting: General Surgery

## 2020-03-15 DIAGNOSIS — R634 Abnormal weight loss: Secondary | ICD-10-CM

## 2020-03-15 DIAGNOSIS — D649 Anemia, unspecified: Secondary | ICD-10-CM

## 2020-03-15 DIAGNOSIS — D696 Thrombocytopenia, unspecified: Secondary | ICD-10-CM

## 2020-03-15 NOTE — Progress Notes (Signed)
  Agree with assessment as outlined. LAEs had improved now clearly have elevated again and rising based on recent labs.  Agree with workup as ordered. Given AP elevation along with AST /ALT will proceed with MRCP but if unrevealing I think she is going to need a liver biopsy at this point to help clarify etiology. There is no evidence of cirrhosis or splenomegaly on imaging thus far, but will await MRI. I would like to repeat LFTs in 1 week and also would add CK level given AST predominant elevation and that her CK levels have been elevated in the past, if you can order that. Please keep me posted on results of her labs once they all return and MRCP findings. She should minimize medications and avoid all supplements at this point.

## 2020-03-15 NOTE — Telephone Encounter (Signed)
Noted.  IgG level was added to the lab order to be done in 1 week.

## 2020-03-15 NOTE — Telephone Encounter (Signed)
-----   Message from Noralyn Pick, NP sent at 03/15/2020  8:19 AM EDT ----- Olivia Mackie, please contact the patient's daughter to communicate. Provide patient with a lab order to have hepatic panel and CK  (Creatinine Kinase) level done in 1 week. THX ----- Message ----- From: Yetta Flock, MD Sent: 03/15/2020   7:51 AM EDT To: Noralyn Pick, NP    ----- Message ----- From: Noralyn Pick, NP Sent: 03/14/2020   1:56 PM EDT To: Yetta Flock, MD

## 2020-03-15 NOTE — Telephone Encounter (Signed)
Spoke with the patients daughter regarding bringing her mother to the lab in 1 week  For repeat labwork. She wanted to let us know her mother is having pain, shortness of breath and is very weak.  She denied her mother having a temperature. Daughter is concerned and would like to discuss her mothers symptoms with Jaclyn Shaggy.

## 2020-03-15 NOTE — Telephone Encounter (Signed)
Contacted the patients daughter, Dane back per Colleens recommendation.Left a detailed message on her voicemail.  If the patient is having shortness of breath she should take her into the emergency room. I expressed to her to call back should she need any clarification.

## 2020-03-15 NOTE — Telephone Encounter (Signed)
Left Nary a voicemail to contact our office. I placed orders in her  Mothers chart for labs to be drawn in 1 week.

## 2020-03-15 NOTE — Telephone Encounter (Signed)
Dane called the office back before listening to the detailed message regarding taking her mother to the emergency room for her shortness of breath. Explained this to [place] again and she verbalized understanding.

## 2020-03-16 LAB — AFP TUMOR MARKER: AFP-Tumor Marker: 3.6 ng/mL

## 2020-03-16 LAB — HEPATITIS B DNA, ULTRAQUANTITATIVE, PCR
Hepatitis B DNA (Calc): <1 Log IU/mL
Hepatitis B DNA: <10 IU/mL

## 2020-03-16 LAB — IRON, TOTAL/TOTAL IRON BINDING CAP
%SAT: 37 % (calc) (ref 16–45)
Iron: 70 ug/dL (ref 45–160)
TIBC: 187 mcg/dL (calc) — ABNORMAL LOW (ref 250–450)

## 2020-03-16 LAB — TISSUE TRANSGLUTAMINASE, IGA: (tTG) Ab, IgA: 2 U/mL

## 2020-03-16 LAB — HEPATITIS B SURFACE ANTIGEN: Hepatitis B Surface Ag: NONREACTIVE

## 2020-03-16 LAB — HEPATITIS B CORE ANTIBODY, TOTAL: Hep B Core Total Ab: NONREACTIVE

## 2020-03-16 LAB — HEPATITIS B CORE ANTIBODY, IGM: Hep B C IgM: NONREACTIVE

## 2020-03-17 ENCOUNTER — Encounter: Payer: Self-pay | Admitting: Gastroenterology

## 2020-03-21 ENCOUNTER — Other Ambulatory Visit (INDEPENDENT_AMBULATORY_CARE_PROVIDER_SITE_OTHER): Payer: 59

## 2020-03-21 DIAGNOSIS — D696 Thrombocytopenia, unspecified: Secondary | ICD-10-CM | POA: Diagnosis not present

## 2020-03-21 DIAGNOSIS — D649 Anemia, unspecified: Secondary | ICD-10-CM

## 2020-03-21 DIAGNOSIS — R634 Abnormal weight loss: Secondary | ICD-10-CM

## 2020-03-21 LAB — HEPATIC FUNCTION PANEL
ALT: 81 U/L — ABNORMAL HIGH (ref 0–35)
AST: 216 U/L — ABNORMAL HIGH (ref 0–37)
Albumin: 2 g/dL — ABNORMAL LOW (ref 3.5–5.2)
Alkaline Phosphatase: 410 U/L — ABNORMAL HIGH (ref 39–117)
Bilirubin, Direct: 0.1 mg/dL (ref 0.0–0.3)
Total Bilirubin: 0.3 mg/dL (ref 0.2–1.2)
Total Protein: 5.8 g/dL — ABNORMAL LOW (ref 6.0–8.3)

## 2020-03-21 LAB — CK: Total CK: 103 U/L (ref 7–177)

## 2020-03-22 ENCOUNTER — Emergency Department (HOSPITAL_COMMUNITY): Payer: 59

## 2020-03-22 ENCOUNTER — Inpatient Hospital Stay (HOSPITAL_COMMUNITY)
Admission: EM | Admit: 2020-03-22 | Discharge: 2020-03-23 | DRG: 607 | Disposition: A | Discharge status: Other Acute Inpatient Hospital | Payer: 59 | Attending: Internal Medicine | Admitting: Internal Medicine

## 2020-03-22 ENCOUNTER — Encounter (HOSPITAL_COMMUNITY): Payer: Self-pay | Admitting: Emergency Medicine

## 2020-03-22 ENCOUNTER — Ambulatory Visit: Payer: Self-pay | Admitting: *Deleted

## 2020-03-22 ENCOUNTER — Other Ambulatory Visit: Payer: Self-pay

## 2020-03-22 DIAGNOSIS — T378X5A Adverse effect of other specified systemic anti-infectives and antiparasitics, initial encounter: Secondary | ICD-10-CM | POA: Diagnosis present

## 2020-03-22 DIAGNOSIS — M130 Polyarthritis, unspecified: Secondary | ICD-10-CM | POA: Diagnosis present

## 2020-03-22 DIAGNOSIS — T471X5A Adverse effect of other antacids and anti-gastric-secretion drugs, initial encounter: Secondary | ICD-10-CM | POA: Diagnosis present

## 2020-03-22 DIAGNOSIS — R1319 Other dysphagia: Secondary | ICD-10-CM

## 2020-03-22 DIAGNOSIS — Z79899 Other long term (current) drug therapy: Secondary | ICD-10-CM

## 2020-03-22 DIAGNOSIS — Z20822 Contact with and (suspected) exposure to covid-19: Secondary | ICD-10-CM | POA: Diagnosis present

## 2020-03-22 DIAGNOSIS — L270 Generalized skin eruption due to drugs and medicaments taken internally: Principal | ICD-10-CM | POA: Diagnosis present

## 2020-03-22 DIAGNOSIS — K068 Other specified disorders of gingiva and edentulous alveolar ridge: Secondary | ICD-10-CM | POA: Diagnosis present

## 2020-03-22 DIAGNOSIS — R1310 Dysphagia, unspecified: Secondary | ICD-10-CM

## 2020-03-22 DIAGNOSIS — D696 Thrombocytopenia, unspecified: Secondary | ICD-10-CM | POA: Diagnosis present

## 2020-03-22 DIAGNOSIS — E860 Dehydration: Secondary | ICD-10-CM | POA: Diagnosis present

## 2020-03-22 DIAGNOSIS — R21 Rash and other nonspecific skin eruption: Secondary | ICD-10-CM | POA: Diagnosis present

## 2020-03-22 DIAGNOSIS — B370 Candidal stomatitis: Secondary | ICD-10-CM | POA: Diagnosis present

## 2020-03-22 DIAGNOSIS — D649 Anemia, unspecified: Secondary | ICD-10-CM

## 2020-03-22 DIAGNOSIS — D638 Anemia in other chronic diseases classified elsewhere: Secondary | ICD-10-CM | POA: Diagnosis present

## 2020-03-22 DIAGNOSIS — E872 Acidosis, unspecified: Secondary | ICD-10-CM

## 2020-03-22 DIAGNOSIS — T7840XA Allergy, unspecified, initial encounter: Secondary | ICD-10-CM

## 2020-03-22 DIAGNOSIS — Z7952 Long term (current) use of systemic steroids: Secondary | ICD-10-CM

## 2020-03-22 DIAGNOSIS — R7989 Other specified abnormal findings of blood chemistry: Secondary | ICD-10-CM

## 2020-03-22 DIAGNOSIS — Y92009 Unspecified place in unspecified non-institutional (private) residence as the place of occurrence of the external cause: Secondary | ICD-10-CM

## 2020-03-22 DIAGNOSIS — M069 Rheumatoid arthritis, unspecified: Secondary | ICD-10-CM | POA: Diagnosis present

## 2020-03-22 DIAGNOSIS — D72819 Decreased white blood cell count, unspecified: Secondary | ICD-10-CM

## 2020-03-22 LAB — CBC WITH DIFFERENTIAL/PLATELET
Abs Immature Granulocytes: 0.01 10*3/uL (ref 0.00–0.07)
Basophils Absolute: 0 10*3/uL (ref 0.0–0.1)
Basophils Relative: 0 %
Eosinophils Absolute: 0 10*3/uL (ref 0.0–0.5)
Eosinophils Relative: 0 %
HCT: 50.5 % — ABNORMAL HIGH (ref 36.0–46.0)
Hemoglobin: 16.2 g/dL — ABNORMAL HIGH (ref 12.0–15.0)
Immature Granulocytes: 0 %
Lymphocytes Relative: 8 %
Lymphs Abs: 0.2 10*3/uL — ABNORMAL LOW (ref 0.7–4.0)
MCH: 29.9 pg (ref 26.0–34.0)
MCHC: 32.1 g/dL (ref 30.0–36.0)
MCV: 93.3 fL (ref 80.0–100.0)
Monocytes Absolute: 0.2 10*3/uL (ref 0.1–1.0)
Monocytes Relative: 5 %
Neutro Abs: 2.4 10*3/uL (ref 1.7–7.7)
Neutrophils Relative %: 87 %
Platelets: 106 10*3/uL — ABNORMAL LOW (ref 150–400)
RBC: 5.41 MIL/uL — ABNORMAL HIGH (ref 3.87–5.11)
RDW: 15.5 % (ref 11.5–15.5)
WBC: 2.8 10*3/uL — ABNORMAL LOW (ref 4.0–10.5)
nRBC: 0 % (ref 0.0–0.2)

## 2020-03-22 LAB — COMPREHENSIVE METABOLIC PANEL
ALT: 80 U/L — ABNORMAL HIGH (ref 0–44)
AST: 211 U/L — ABNORMAL HIGH (ref 15–41)
Albumin: 1.6 g/dL — ABNORMAL LOW (ref 3.5–5.0)
Alkaline Phosphatase: 373 U/L — ABNORMAL HIGH (ref 38–126)
Anion gap: 13 (ref 5–15)
BUN: 11 mg/dL (ref 6–20)
CO2: 20 mmol/L — ABNORMAL LOW (ref 22–32)
Calcium: 7.5 mg/dL — ABNORMAL LOW (ref 8.9–10.3)
Chloride: 101 mmol/L (ref 98–111)
Creatinine, Ser: 0.54 mg/dL (ref 0.44–1.00)
GFR calc Af Amer: >60 mL/min (ref >60)
GFR calc non Af Amer: >60 mL/min (ref >60)
Glucose, Bld: 169 mg/dL — ABNORMAL HIGH (ref 70–99)
Potassium: 4.1 mmol/L (ref 3.5–5.1)
Sodium: 134 mmol/L — ABNORMAL LOW (ref 135–145)
Total Bilirubin: 0.5 mg/dL (ref 0.3–1.2)
Total Protein: 5.5 g/dL — ABNORMAL LOW (ref 6.5–8.1)

## 2020-03-22 LAB — IGG: IgG (Immunoglobin G), Serum: 1459 mg/dL (ref 600–1640)

## 2020-03-22 LAB — LIPASE, BLOOD: Lipase: 20 U/L (ref 11–51)

## 2020-03-22 LAB — URINALYSIS, ROUTINE W REFLEX MICROSCOPIC
Bacteria, UA: NONE SEEN
Bilirubin Urine: NEGATIVE
Glucose, UA: NEGATIVE mg/dL
Ketones, ur: 20 mg/dL — AB
Leukocytes,Ua: NEGATIVE
Nitrite: NEGATIVE
Protein, ur: >=300 mg/dL — AB
Specific Gravity, Urine: 1.02 (ref 1.005–1.030)
pH: 7 (ref 5.0–8.0)

## 2020-03-22 LAB — CK: Total CK: 166 U/L (ref 38–234)

## 2020-03-22 LAB — C-REACTIVE PROTEIN: CRP: 0.7 mg/dL (ref <1.0)

## 2020-03-22 LAB — LACTIC ACID, PLASMA: Lactic Acid, Venous: 2.7 mmol/L (ref 0.5–1.9)

## 2020-03-22 MED ORDER — HYDROMORPHONE HCL 1 MG/ML IJ SOLN
0.5000 mg | Freq: Once | INTRAMUSCULAR | Status: AC
  Administered 2020-03-22: 0.5 mg via INTRAVENOUS
  Filled 2020-03-22: qty 1

## 2020-03-22 MED ORDER — LACTATED RINGERS IV BOLUS
1000.0000 mL | Freq: Once | INTRAVENOUS | Status: AC
  Administered 2020-03-23: 1000 mL via INTRAVENOUS

## 2020-03-22 MED ORDER — HYDROMORPHONE HCL 1 MG/ML IJ SOLN
0.5000 mg | INTRAMUSCULAR | Status: DC | PRN
  Administered 2020-03-23: 0.5 mg via INTRAVENOUS
  Filled 2020-03-22: qty 1

## 2020-03-22 MED ORDER — LACTATED RINGERS IV SOLN: INTRAVENOUS | Status: DC

## 2020-03-22 NOTE — ED Provider Notes (Signed)
[facility] EMERGENCY DEPARTMENT Provider Note   CSN: [id number] Arrival date & time: 03/22/20  2006     History Chief Complaint  Patient presents with  . Facial Swelling  . Dysphagia    Kathleen Costa is a 54 y.o. female.  HPI He has been having problems going on now for several weeks with a rash of her face and neck.  It has gotten much worse.  Several days ago there were urticarial looking swellings of the patient's lips and upper chest.  Today it got much worse and she felt it was difficult to breathe and swallow.  She has had ongoing problems with oral pain and difficulty swallowing.  At one point she was diagnosed with thrush and treated with no improvement.  She has pain and burning and swelling of her tongue and throat.  Today it got very severe and she had to call EMS.  EMS found that she had a lot of oral and facial swelling and administered epinephrine and Solu-Medrol  Patient has had weight loss and difficulty eating and drinking.  The symptoms have been going on for about a month.  Patient's daughter is present.  She reports her mother is usually very active and does many different kinds of chores and activities.  She reports over the past month she has been very fatigued and unable to do usual activities due to fatigue and weight loss.    Past Medical History:  Diagnosis Date  . Adenomyomatosis of gallbladder 10/2017   Abd U/S  . Arthritis   . Disturbance of skin sensation   . Headache   . Migraine without aura, without mention of intractable migraine without mention of status migrainosus 04/22/2014  . Rheumatoid arthritis [facility])     Patient Active Problem List   Diagnosis Date Noted  . Acute metabolic encephalopathy 83/66/2947  . Myalgia 10/01/2017  . Polyarthritis 10/01/2017  . Paresthesias 10/01/2017  . Acute hypoxemic respiratory failure ([place]) 10/01/2017  . Thickening of wall of gallbladder 10/01/2017  . Elevated liver enzymes 10/01/2017   . Hypokalemia 10/01/2017  . Lymphadenopathy 09/28/2017  . Common migraine with intractable migraine 04/22/2014    Past Surgical History:  Procedure Laterality Date  . CARPAL TUNNEL RELEASE Right      OB History   No obstetric history on file.     Family History  Problem Relation Age of Onset  . Migraines Mother   . Migraines Sister   . Dementia Brother   . Migraines Brother   . Migraines Brother   . Cancer - Lung Brother     Social History   Tobacco Use  . Smoking status: Never Smoker  . Smokeless tobacco: Never Used  Vaping Use  . Vaping Use: Never used  Substance Use Topics  . Alcohol use: No  . Drug use: No    Home Medications Prior to Admission medications   Medication Sig Start Date End Date Taking? Authorizing Provider  fluconazole (DIFLUCAN) 200 MG tablet Take 1 tablet (200 mg total) by mouth daily. 03/08/20   Daleen Bo, MD  Naphazoline HCl (CLEAR EYES OP) Place 1 drop into both eyes daily as needed (For dry eyes).    [provider]  pantoprazole (PROTONIX) 20 MG tablet Take 1 tablet (20 mg total) by mouth daily. 03/10/20   Lamptey, Myrene Galas, MD  predniSONE (STERAPRED UNI-PAK 21 TAB) 10 MG (21) TBPK tablet Take by mouth daily. Take 6 tabs by mouth daily  for 2 days, then  5 tabs for 2 days, then 4 tabs for 2 days, then 3 tabs for 2 days, 2 tabs for 2 days, then 1 tab by mouth daily for 2 days 02/04/20   Delia Heady, [name]  amitriptyline (ELAVIL) 25 MG tablet Take 1 tablet (25 mg total) by mouth at bedtime. Patient not taking: Reported on 02/04/2020 09/17/17 03/10/20  [name], MD  eletriptan (RELPAX) 40 MG tablet Take 1 tablet (40 mg total) by mouth 2 (two) times daily as needed. Patient not taking: Reported on 02/04/2020 04/22/14 03/10/20  [name], MD    Allergies    Patient has no known allergies.  Review of Systems   Review of Systems 10 systems reviewed and negative except as per HPI Physical Exam Updated Vital Signs BP  112/77 (BP Location: Right Arm)   Pulse (!) 112   Temp 99.1 F (37.3 C) (Oral)   Resp (!) 28   Ht 5\' 1"  (1.549 m)   Wt (!) 44 kg   SpO2 100%   BMI 18.33 kg/m   Physical Exam Constitutional:      Comments: Alert and no respiratory distress.  Nontoxic.  Thin.  HENT:     Head:     Comments: Patient has a urticarial type rash somewhat irregular all around her lips and chin.  This is also on the base of the neck.  See attached images.    Nose: Nose normal.     Mouth/Throat:     Comments: She has vesicular lesions on the bottom of the tongue.  Top of tongue is smooth and shiny and slightly hyperemic.  Posterior pharyngeal tissues hyperemic.  No exudate.  Airway is widely patent. Eyes:     Extraocular Movements: Extraocular movements intact.     Pupils: Pupils are equal, round, and reactive to light.  Cardiovascular:     Comments: Tachycardia.  No gross murmur gallop Pulmonary:     Effort: Pulmonary effort is normal.     Breath sounds: Normal breath sounds.  Abdominal:     General: There is no distension.     Palpations: Abdomen is soft.     Tenderness: There is no abdominal tenderness. There is no guarding.  Musculoskeletal:        General: No swelling or tenderness. Normal range of motion.     Cervical back: Neck supple.     Right lower leg: No edema.     Left lower leg: No edema.  Skin:    General: Skin is warm and dry.  Neurological:     General: No focal deficit present.     Mental Status: She is oriented to person, place, and time.     Coordination: Coordination normal.  Psychiatric:        Mood and Affect: Mood normal.         ED Results / Procedures / Treatments   Labs (all labs ordered are listed, but only abnormal results are displayed) Labs Reviewed - No data to display  EKG None  Radiology No results found.  Procedures Procedures (including critical care time)  Medications Ordered in ED Medications - No data to display  ED Course  I have  reviewed the triage vital signs and the nursing notes.  Pertinent labs & imaging results that were available during my care of the patient were reviewed by me and considered in my medical decision making (see chart for details).    MDM Rules/Calculators/A&P  Patient was treated by EMS with Solu-Medrol and epinephrine.  Reportedly patient had difficulty breathing on their arrival.  He was given Benadryl.  Patient was having reported difficulty swallowing and breathing.  We will continue to observe.  At this time is appears to be urticarial or infectious type of reactive rash.  Patient does have rheumatoid arthritis and has been off of her medications due to adverse reaction.  Consideration given that this is autoimmune reaction.  She will need to be reassessed after hydration and determination if there is any rebound of the facial airway swelling. Dr Roxanne Mins to reassess after hydration and observation for any rebound swelling. Final Clinical Impression(s) / ED Diagnoses Final diagnoses:  None    Rx / DC Orders ED Discharge Orders    None       Charlesetta Shanks, MD 03/22/20 2353

## 2020-03-22 NOTE — ED Provider Notes (Signed)
Care assumed from Dr. Johnney Killian, patient with facial rash.  Initial lactic acid level is slightly elevated.  Sedimentation rate and repeat lactate are pending.  Also, will need to assess response to treatment.  12:57 AM Sedimentation rate is only mildly elevated.  However, patient has had worsening of her facial rash and she is complaining of pain throughout the face and neck.  She is complaining of difficulty swallowing.  On exam, there is new conjunctival drainage bilaterally, erythematous, papular rash as worsened in the face and neck.  She is noted to have white patches in the pharynx consistent with thrush.  Given failure to respond to steroids and antihistamines and worsening condition, she will need to be admitted for observation, possible additional work-up.  Case is discussed with Dr. Hal Hope of [facility], who agrees to admit the patient.  Dr.Kakrakandy has seen the patient and does not feel comfortable taking care of her here because of lack of inpatient dermatology and rheumatology.  I have discussed case with Dr. Janell Quiet at [facility] who states that patient will be placed on a wait list.  2:36 AM Lactic acid level has increased and is now 7.2.  We will give IV fluid bolus.  However, with low WBC and abnormal CRP and patient afebrile, I do not see indication for initiating antibiotics.  3:14 AM I have contacted both [facility] and [facility] transfer teams and I am awaiting callback's.  I have been advised that there is likely to be a wait list at both locations.  Case discussed again with Dr. Hal Hope who agrees to admit the patient while waiting for transfer to appropriate facility that is able to accept her.  3:24 AM [facility] is declining the patient because of no bed availability.  6:26 AM Callback received from Dr. Wonda Cheng at [facility] who states that she can accept the patient in transfer, but would have to put the patient on a rather lengthy  wait list. She did suggest possibility that rash could be a drug reaction from fluconazole.  CRITICAL CARE Performed by: Delora Fuel Total critical care time: 90 minutes Critical care time was exclusive of separately billable procedures and treating other patients. Critical care was necessary to treat or prevent imminent or life-threatening deterioration. Critical care was time spent personally by me on the following activities: development of treatment plan with patient and/or surrogate as well as nursing, discussions with consultants, evaluation of patient's response to treatment, examination of patient, obtaining history from patient or surrogate, ordering and performing treatments and interventions, ordering and review of laboratory studies, ordering and review of radiographic studies, pulse oximetry and re-evaluation of patient's condition.   Delora Fuel, MD 92/42/68 [phone]

## 2020-03-22 NOTE — ED Triage Notes (Signed)
Pt from home, per EMS pt has had 2weeks of skin irritation, redness and minor swelling.  Today woke up w/ facial swelling, tongue swelling, difficulty swallowing.  EMS give .3 epi, some improvement however ten minutes later was unable to swallow.  Given additional repeat dose symptoms improved.  Also give 50 IV benadryl, 125 Sol med,.    116/70 130HR 98%RA RR 24  20G L AC

## 2020-03-22 NOTE — ED Triage Notes (Signed)
BIB by [facility] after difficulty breathing. EMS reports decrease respiratory patency,   125 mg Solumedrol, 1 mg Epi x 2, and bendaryl given en route. Upon arrival, pt RR 30, 100% 02 sat, HR 106, B/P 112/77. Drainage noted around the eyes. PT notes rash on neck and arm that has occurred over the course of 2 weeks. Pt has hx of RA .

## 2020-03-22 NOTE — Telephone Encounter (Signed)
Patient's daughter called to report patient is having difficulty swallowing and breathing. Reports dark red rash to face and lips. Patient lost 17 lbs within 3 weeks. Very weak, and was seen in ED 3 weeks ago. Instructed patient's daughter to take patient to nearest ED or call 911. Patient's daughter hesitant but agreeable to take patient back to ED. Care advise given . Patient's daughter verbalized understanding of care advise.  Reason for Disposition . Slow, shallow and weak breathing  Answer Assessment - Initial Assessment Questions 1. RESPIRATORY STATUS: "Describe your breathing?" (e.g., wheezing, shortness of breath, unable to speak, severe coughing)      Shallow breathing and difficulty swallowing 2. ONSET: "When did this breathing problem begin?"      today 3. PATTERN "Does the difficult breathing come and go, or has it been constant since it started?"      Increasing since 3 weeks ago and difficulty swallowing 4. SEVERITY: "How bad is your breathing?" (e.g., mild, moderate, severe)    - MILD: No SOB at rest, mild SOB with walking, speaks normally in sentences, can lay down, no retractions, pulse < 100.    - MODERATE: SOB at rest, SOB with minimal exertion and prefers to sit, cannot lie down flat, speaks in phrases, mild retractions, audible wheezing, pulse 100-120.    - SEVERE: Very SOB at rest, speaks in single words, struggling to breathe, sitting hunched forward, retractions, pulse > 120      Hard to breath laying down 5. RECURRENT SYMPTOM: "Have you had difficulty breathing before?" If Yes, ask: "When was the last time?" and "What happened that time?"      na 6. CARDIAC HISTORY: "Do you have any history of heart disease?" (e.g., heart attack, angina, bypass surgery, angioplasty)      na 7. LUNG HISTORY: "Do you have any history of lung disease?"  (e.g., pulmonary embolus, asthma, emphysema)     na 8. CAUSE: "What do you think is causing the breathing problem?"      Has been sick  for 3 weeks 9. OTHER SYMPTOMS: "Do you have any other symptoms? (e.g., dizziness, runny nose, cough, chest pain, fever)     Difficulty swallowing, rash noted to face and lips, lost 17 lbs in 3 weeks, very weak 10. PREGNANCY: "Is there any chance you are pregnant?" "When was your last menstrual period?"       na 11. TRAVEL: "Have you traveled out of the country in the last month?" (e.g., travel history, exposures)       na  Protocols used: BREATHING DIFFICULTY-A-AH

## 2020-03-23 ENCOUNTER — Encounter (HOSPITAL_COMMUNITY): Payer: Self-pay | Admitting: Internal Medicine

## 2020-03-23 DIAGNOSIS — M069 Rheumatoid arthritis, unspecified: Secondary | ICD-10-CM | POA: Diagnosis present

## 2020-03-23 DIAGNOSIS — L270 Generalized skin eruption due to drugs and medicaments taken internally: Secondary | ICD-10-CM | POA: Diagnosis present

## 2020-03-23 DIAGNOSIS — E872 Acidosis, unspecified: Secondary | ICD-10-CM

## 2020-03-23 DIAGNOSIS — M130 Polyarthritis, unspecified: Secondary | ICD-10-CM | POA: Diagnosis present

## 2020-03-23 DIAGNOSIS — Z79899 Other long term (current) drug therapy: Secondary | ICD-10-CM | POA: Diagnosis not present

## 2020-03-23 DIAGNOSIS — Y92009 Unspecified place in unspecified non-institutional (private) residence as the place of occurrence of the external cause: Secondary | ICD-10-CM | POA: Diagnosis not present

## 2020-03-23 DIAGNOSIS — R1310 Dysphagia, unspecified: Secondary | ICD-10-CM

## 2020-03-23 DIAGNOSIS — T471X5A Adverse effect of other antacids and anti-gastric-secretion drugs, initial encounter: Secondary | ICD-10-CM | POA: Diagnosis present

## 2020-03-23 DIAGNOSIS — E860 Dehydration: Secondary | ICD-10-CM | POA: Diagnosis present

## 2020-03-23 DIAGNOSIS — R1319 Other dysphagia: Secondary | ICD-10-CM

## 2020-03-23 DIAGNOSIS — B370 Candidal stomatitis: Secondary | ICD-10-CM | POA: Diagnosis present

## 2020-03-23 DIAGNOSIS — R21 Rash and other nonspecific skin eruption: Secondary | ICD-10-CM | POA: Diagnosis present

## 2020-03-23 DIAGNOSIS — T378X5A Adverse effect of other specified systemic anti-infectives and antiparasitics, initial encounter: Secondary | ICD-10-CM | POA: Diagnosis present

## 2020-03-23 DIAGNOSIS — Z20822 Contact with and (suspected) exposure to covid-19: Secondary | ICD-10-CM | POA: Diagnosis present

## 2020-03-23 DIAGNOSIS — D638 Anemia in other chronic diseases classified elsewhere: Secondary | ICD-10-CM | POA: Diagnosis present

## 2020-03-23 DIAGNOSIS — K068 Other specified disorders of gingiva and edentulous alveolar ridge: Secondary | ICD-10-CM | POA: Diagnosis present

## 2020-03-23 DIAGNOSIS — Z7952 Long term (current) use of systemic steroids: Secondary | ICD-10-CM | POA: Diagnosis not present

## 2020-03-23 DIAGNOSIS — D696 Thrombocytopenia, unspecified: Secondary | ICD-10-CM | POA: Diagnosis present

## 2020-03-23 LAB — RAPID URINE DRUG SCREEN, HOSP PERFORMED
Amphetamines: NOT DETECTED
Barbiturates: NOT DETECTED
Benzodiazepines: NOT DETECTED
Cocaine: NOT DETECTED
Opiates: NOT DETECTED
Tetrahydrocannabinol: NOT DETECTED

## 2020-03-23 LAB — CBC WITH DIFFERENTIAL/PLATELET
Abs Immature Granulocytes: 0 10*3/uL (ref 0.00–0.07)
Basophils Absolute: 0 10*3/uL (ref 0.0–0.1)
Basophils Relative: 0 %
Eosinophils Absolute: 0 10*3/uL (ref 0.0–0.5)
Eosinophils Relative: 0 %
HCT: 29.8 % — ABNORMAL LOW (ref 36.0–46.0)
Hemoglobin: 9.2 g/dL — ABNORMAL LOW (ref 12.0–15.0)
Lymphocytes Relative: 3 %
Lymphs Abs: 0.1 10*3/uL — ABNORMAL LOW (ref 0.7–4.0)
MCH: 29.9 pg (ref 26.0–34.0)
MCHC: 30.9 g/dL (ref 30.0–36.0)
MCV: 96.8 fL (ref 80.0–100.0)
Monocytes Absolute: 0 10*3/uL — ABNORMAL LOW (ref 0.1–1.0)
Monocytes Relative: 0 %
Neutro Abs: 2.9 10*3/uL (ref 1.7–7.7)
Neutrophils Relative %: 97 %
Platelets: 166 10*3/uL (ref 150–400)
RBC: 3.08 MIL/uL — ABNORMAL LOW (ref 3.87–5.11)
RDW: 15.4 % (ref 11.5–15.5)
WBC: 3 10*3/uL — ABNORMAL LOW (ref 4.0–10.5)
nRBC: 0 % (ref 0.0–0.2)
nRBC: 1 /100 WBC — ABNORMAL HIGH

## 2020-03-23 LAB — SEDIMENTATION RATE
Sed Rate: 27 mm/hr — ABNORMAL HIGH (ref 0–22)
Sed Rate: 70 mm/hr — ABNORMAL HIGH (ref 0–22)

## 2020-03-23 LAB — HEPATIC FUNCTION PANEL
ALT: 59 U/L — ABNORMAL HIGH (ref 0–44)
AST: 139 U/L — ABNORMAL HIGH (ref 15–41)
Albumin: 1.2 g/dL — ABNORMAL LOW (ref 3.5–5.0)
Alkaline Phosphatase: 277 U/L — ABNORMAL HIGH (ref 38–126)
Bilirubin, Direct: 0.2 mg/dL (ref 0.0–0.2)
Indirect Bilirubin: 0.2 mg/dL — ABNORMAL LOW (ref 0.3–0.9)
Total Bilirubin: 0.4 mg/dL (ref 0.3–1.2)
Total Protein: 4.5 g/dL — ABNORMAL LOW (ref 6.5–8.1)

## 2020-03-23 LAB — BASIC METABOLIC PANEL
Anion gap: 6 (ref 5–15)
BUN: 10 mg/dL (ref 6–20)
CO2: 25 mmol/L (ref 22–32)
Calcium: 7.3 mg/dL — ABNORMAL LOW (ref 8.9–10.3)
Chloride: 104 mmol/L (ref 98–111)
Creatinine, Ser: 0.43 mg/dL — ABNORMAL LOW (ref 0.44–1.00)
GFR calc Af Amer: >60 mL/min (ref >60)
GFR calc non Af Amer: >60 mL/min (ref >60)
Glucose, Bld: 165 mg/dL — ABNORMAL HIGH (ref 70–99)
Potassium: 4.7 mmol/L (ref 3.5–5.1)
Sodium: 135 mmol/L (ref 135–145)

## 2020-03-23 LAB — PROCALCITONIN: Procalcitonin: 0.15 ng/mL

## 2020-03-23 LAB — PROTIME-INR
INR: 0.9 (ref 0.8–1.2)
Prothrombin Time: 11.8 seconds (ref 11.4–15.2)

## 2020-03-23 LAB — SARS CORONAVIRUS 2 BY RT PCR (HOSPITAL ORDER, PERFORMED IN ~~LOC~~ HOSPITAL LAB): SARS Coronavirus 2: NEGATIVE

## 2020-03-23 LAB — HIV ANTIBODY (ROUTINE TESTING W REFLEX): HIV Screen 4th Generation wRfx: NONREACTIVE

## 2020-03-23 LAB — LACTIC ACID, PLASMA
Lactic Acid, Venous: 7.2 mmol/L (ref 0.5–1.9)
Lactic Acid, Venous: 1.9 mmol/L (ref 0.5–1.9)
Lactic Acid, Venous: 1.5 mmol/L (ref 0.5–1.9)

## 2020-03-23 MED ORDER — ACETAMINOPHEN 325 MG PO TABS: 650.0000 mg | ORAL_TABLET | Freq: Four times a day (QID) | ORAL | Status: DC | PRN

## 2020-03-23 MED ORDER — SODIUM CHLORIDE 0.9 % IV SOLN
2.0000 g | Freq: Two times a day (BID) | INTRAVENOUS | Status: DC
  Filled 2020-03-23: qty 2

## 2020-03-23 MED ORDER — HYDROCORTISONE NA SUCCINATE PF 100 MG IJ SOLR: 50.0000 mg | Freq: Four times a day (QID) | INTRAMUSCULAR | Status: DC

## 2020-03-23 MED ORDER — VANCOMYCIN HCL 750 MG/150ML IV SOLN
750.0000 mg | Freq: Once | INTRAVENOUS | Status: AC
  Administered 2020-03-23: 750 mg via INTRAVENOUS
  Filled 2020-03-23: qty 150

## 2020-03-23 MED ORDER — POLYVINYL ALCOHOL 1.4 % OP SOLN
1.0000 [drp] | OPHTHALMIC | Status: DC | PRN
  Filled 2020-03-23: qty 15

## 2020-03-23 MED ORDER — MAGIC MOUTHWASH W/LIDOCAINE
10.0000 mL | Freq: Four times a day (QID) | ORAL | Status: DC | PRN
  Filled 2020-03-23: qty 10

## 2020-03-23 MED ORDER — LIP MEDEX EX OINT
TOPICAL_OINTMENT | CUTANEOUS | Status: DC | PRN
  Filled 2020-03-23: qty 7

## 2020-03-23 MED ORDER — FAMOTIDINE IN NACL 20-0.9 MG/50ML-% IV SOLN
20.0000 mg | Freq: Two times a day (BID) | INTRAVENOUS | Status: DC
  Administered 2020-03-23: 20 mg via INTRAVENOUS
  Filled 2020-03-23 (×2): qty 50

## 2020-03-23 MED ORDER — HYDROMORPHONE HCL 1 MG/ML IJ SOLN
0.5000 mg | Freq: Once | INTRAMUSCULAR | Status: AC
  Administered 2020-03-23: 0.5 mg via INTRAVENOUS
  Filled 2020-03-23: qty 1

## 2020-03-23 MED ORDER — HYDROCORTISONE NA SUCCINATE PF 100 MG IJ SOLR
50.0000 mg | Freq: Four times a day (QID) | INTRAMUSCULAR | Status: DC
  Administered 2020-03-23 (×2): 50 mg via INTRAVENOUS
  Filled 2020-03-23 (×3): qty 2

## 2020-03-23 MED ORDER — ONDANSETRON HCL 4 MG/2ML IJ SOLN: 4.0000 mg | Freq: Four times a day (QID) | INTRAMUSCULAR | Status: DC | PRN

## 2020-03-23 MED ORDER — MAGIC MOUTHWASH W/LIDOCAINE: 10.0000 mL | Freq: Four times a day (QID) | ORAL | 0 refills | Status: DC | PRN

## 2020-03-23 MED ORDER — ONDANSETRON HCL 4 MG PO TABS: 4.0000 mg | ORAL_TABLET | Freq: Four times a day (QID) | ORAL | Status: DC | PRN

## 2020-03-23 MED ORDER — SODIUM CHLORIDE 0.9 % IV SOLN
1.0000 g | Freq: Two times a day (BID) | INTRAVENOUS | Status: DC
  Filled 2020-03-23: qty 1

## 2020-03-23 MED ORDER — LACTATED RINGERS IV BOLUS
1500.0000 mL | Freq: Once | INTRAVENOUS | Status: AC
  Administered 2020-03-23: 1500 mL via INTRAVENOUS

## 2020-03-23 MED ORDER — SODIUM CHLORIDE 0.9 % IV SOLN
2.0000 g | Freq: Once | INTRAVENOUS | Status: AC
  Administered 2020-03-23: 2 g via INTRAVENOUS
  Filled 2020-03-23: qty 2

## 2020-03-23 MED ORDER — ACETAMINOPHEN 650 MG RE SUPP: 650.0000 mg | Freq: Four times a day (QID) | RECTAL | Status: DC | PRN

## 2020-03-23 MED ORDER — VANCOMYCIN HCL 750 MG/150ML IV SOLN: 750.0000 mg | INTRAVENOUS | Status: DC

## 2020-03-23 MED ORDER — METHYLPREDNISOLONE SODIUM SUCC 40 MG IJ SOLR: 40.0000 mg | INTRAMUSCULAR | Status: DC

## 2020-03-23 MED ORDER — FAMOTIDINE IN NACL 20-0.9 MG/50ML-% IV SOLN: 20.0000 mg | Freq: Two times a day (BID) | INTRAVENOUS | Status: DC

## 2020-03-23 NOTE — H&P (Signed)
History and Physical    Lesslie Mossa PIR:[id number] DOB: 06-Feb-1966 DOA: 03/22/2020  PCP: Azzie Glatter, FNP  Patient coming from: Home.  Chief Complaint: Rash.  HPI: Kathleen Costa is a 54 y.o. female with history of rheumatoid arthritis on chronic prednisone therapy was admitted in February of this year for respiratory failure and shock has been experiencing poor appetite and generalized weakness over the last 2 months.  Had come to the ER on March 08, 2020 with oral thrush at that time was prescribed fluconazole 2 days later patient had developed some hallucination and was advised to stop taking it.  Patient also had developed some difficulty with swallowing and at the time was prescribed Protonix.  Patient despite which continue to take her fluconazole and completed 7 days course.  Patient has benign generalized body ache but over the last 5 days started having rash around her face which has become more worsened over the last few days knowing bilateral malar areas and is spreading to the neck and upper chest and upper back.  Also noticed some bleeding on the gums.  Has some difficulty swallowing.  Patient also had some difficulty breathing.  ED Course: In the ER patient was afebrile and not hypoxic.  Labs are significant for initially lactic acid of 2.7 which worsened to 7.2.  LFTs are chronically elevated with AST of 211 ALT of 80 for which patient is being followed by Dr. Havery [name].  Sodium 136 4.  Patient has platelets of 106 hemoglobin 16.2 WBC 2.8.  Since patient has diffuse rash with unknown cause with some bleeding from the gums plan was to transfer to tertiary care center where dermatology/rheumatology is available.  Patient was accepted at [facility] but has been waitlisted due to nonavailability of beds.  Patient has been started on IV fluids empiric antibiotics and IV steroids admitted for rash and lactic acidosis.  Review of Systems: As per HPI, rest all  negative.   Past Medical History:  Diagnosis Date  . Adenomyomatosis of gallbladder 10/2017   Abd U/S  . Arthritis   . Disturbance of skin sensation   . Headache   . Migraine without aura, without mention of intractable migraine without mention of status migrainosus 04/22/2014  . Rheumatoid arthritis [facility])     Past Surgical History:  Procedure Laterality Date  . CARPAL TUNNEL RELEASE Right      reports that she has never smoked. She has never used smokeless tobacco. She reports that she does not drink alcohol and does not use drugs.  No Known Allergies  Family History  Problem Relation Age of Onset  . Migraines Mother   . Migraines Sister   . Dementia Brother   . Migraines Brother   . Migraines Brother   . Cancer - Lung Brother     Prior to Admission medications   Medication Sig Start Date End Date Taking? Authorizing Provider  predniSONE (DELTASONE) 5 MG tablet Take 10 mg by mouth daily. 01/26/20  Yes [provider]  fluconazole (DIFLUCAN) 200 MG tablet Take 1 tablet (200 mg total) by mouth daily. Patient not taking: Reported on 03/23/2020 03/08/20   Daleen Bo, MD  pantoprazole (PROTONIX) 20 MG tablet Take 1 tablet (20 mg total) by mouth daily. Patient not taking: Reported on 03/23/2020 03/10/20   Chase Picket, MD  predniSONE (STERAPRED UNI-PAK 21 TAB) 10 MG (21) TBPK tablet Take by mouth daily. Take 6 tabs by mouth daily  for 2 days, then 5  tabs for 2 days, then 4 tabs for 2 days, then 3 tabs for 2 days, 2 tabs for 2 days, then 1 tab by mouth daily for 2 days Patient not taking: Reported on 03/23/2020 02/04/20   Delia Heady, [name]  amitriptyline (ELAVIL) 25 MG tablet Take 1 tablet (25 mg total) by mouth at bedtime. Patient not taking: Reported on 02/04/2020 09/17/17 03/10/20  [name], MD  eletriptan (RELPAX) 40 MG tablet Take 1 tablet (40 mg total) by mouth 2 (two) times daily as needed. Patient not taking: Reported on 02/04/2020 04/22/14 03/10/20   [name], MD    Physical Exam: Constitutional: Moderately built and nourished. Vitals:   03/23/20 0130 03/23/20 0239 03/23/20 0328 03/23/20 0401  BP: 113/71 123/82 (!) 121/93 115/76  Pulse: 87 96 85 88  Resp: 18 21 23 16   Temp:      TempSrc:      SpO2: 99% 92% 99% 95%  Weight:      Height:       Eyes: Anicteric no pallor. ENMT: Has oral thrush and some bleeding from the gums. Neck: Rash on the neck. Respiratory: No obvious rhonchi or crepitations. Cardiovascular: S1-S2 heard. Abdomen: Soft nontender bowel sound present. Musculoskeletal: No edema. Skin: Rash involving the face neck and upper chest and back. Neurologic: Alert awake oriented to time place and person.  Moves all extremities. Psychiatric: Appears normal.  Normal affect.   Labs on Admission: I have personally reviewed following labs and imaging studies  CBC: Recent Labs  Lab 03/22/20 2221  WBC 2.8*  NEUTROABS 2.4  HGB 16.2*  HCT 50.5*  MCV 93.3  PLT 578*   Basic Metabolic Panel: Recent Labs  Lab 03/22/20 2221  NA 134*  K 4.1  CL 101  CO2 20*  GLUCOSE 169*  BUN 11  CREATININE 0.54  CALCIUM 7.5*   GFR: Estimated Creatinine Clearance: 55.2 mL/min (by C-G formula based on SCr of 0.54 mg/dL). Liver Function Tests: Recent Labs  Lab 03/21/20 1109 03/22/20 2221  AST 216* 211*  ALT 81* 80*  ALKPHOS 410* 373*  BILITOT 0.3 0.5  PROT 5.8* 5.5*  ALBUMIN 2.0* 1.6*   Recent Labs  Lab 03/22/20 2221  LIPASE 20   No results for input(s): AMMONIA in the last 168 hours. Coagulation Profile: No results for input(s): INR, PROTIME in the last 168 hours. Cardiac Enzymes: Recent Labs  Lab 03/21/20 1109 03/22/20 2221  CKTOTAL 103 166   BNP (last 3 results) No results for input(s): PROBNP in the last 8760 hours. HbA1C: No results for input(s): HGBA1C in the last 72 hours. CBG: No results for input(s): GLUCAP in the last 168 hours. Lipid Profile: No results for input(s): CHOL, HDL,  LDLCALC, TRIG, CHOLHDL, LDLDIRECT in the last 72 hours. Thyroid Function Tests: No results for input(s): TSH, T4TOTAL, FREET4, T3FREE, THYROIDAB in the last 72 hours. Anemia Panel: No results for input(s): VITAMINB12, FOLATE, FERRITIN, TIBC, IRON, RETICCTPCT in the last 72 hours. Urine analysis:    Component Value Date/Time   COLORURINE YELLOW 03/22/2020 [place] 03/22/2020 2243   LABSPEC 1.020 03/22/2020 2243   PHURINE 7.0 03/22/2020 2243   GLUCOSEU NEGATIVE 03/22/2020 2243   HGBUR SMALL (A) 03/22/2020 2243   BILIRUBINUR NEGATIVE 03/22/2020 2243   BILIRUBINUR negative 04/15/2018 1021   BILIRUBINUR neg 02/02/2013 1518   KETONESUR 20 (A) 03/22/2020 2243   PROTEINUR >=300 (A) 03/22/2020 2243   UROBILINOGEN 0.2 04/15/2018 1021   UROBILINOGEN 0.2 10/16/2017 4696  NITRITE NEGATIVE 03/22/2020 2243   LEUKOCYTESUR NEGATIVE 03/22/2020 2243   Sepsis Labs: @LABRCNTIP (procalcitonin:4,lacticidven:4) ) Recent Results (from the past 240 hour(s))  SARS Coronavirus 2 by RT PCR (hospital order, performed in [facility] hospital lab) Nasopharyngeal Nasopharyngeal Swab     Status: None   Collection Time: 03/23/20  1:51 AM   Specimen: Nasopharyngeal Swab  Result Value Ref Range Status   SARS Coronavirus 2 NEGATIVE NEGATIVE Final    Comment: (NOTE) SARS-CoV-2 target nucleic acids are NOT DETECTED.  The SARS-CoV-2 RNA is generally detectable in upper and lower respiratory specimens during the acute phase of infection. The lowest concentration of SARS-CoV-2 viral copies this assay can detect is 250 copies / mL. A negative result does not preclude SARS-CoV-2 infection and should not be used as the sole basis for treatment or other patient management decisions.  A negative result may occur with improper specimen collection / handling, submission of specimen other than nasopharyngeal swab, presence of viral mutation(s) within the areas targeted by this assay, and inadequate number  of viral copies (<250 copies / mL). A negative result must be combined with clinical observations, patient history, and epidemiological information.  Fact Sheet for Patients:   StrictlyIdeas.no  Fact Sheet for Healthcare Providers: BankingDealers.co.za  This test is not yet approved or  cleared by the [place] FDA and has been authorized for detection and/or diagnosis of SARS-CoV-2 by FDA under an Emergency Use Authorization (EUA).  This EUA will remain in effect (meaning this test can be used) for the duration of the COVID-19 declaration under Section 564(b)(1) of the Act, 21 U.S.C. section 360bbb-3(b)(1), unless the authorization is terminated or revoked sooner.  Performed at [facility], [place] [address], [place], [place] [address]      Radiological Exams on Admission: DG Chest Port 1 View  Result Date: 03/22/2020 CLINICAL DATA:  54 year old female with shortness of breath. EXAM: PORTABLE CHEST 1 VIEW COMPARISON:  Chest radiograph dated 09/30/2017 FINDINGS: The lungs are clear. There is no pleural effusion pneumothorax. The cardiac silhouette is within limits. No acute osseous pathology. IMPRESSION: No active disease. Electronically Signed   By: Anner Crete M.D.   On: 03/22/2020 22:03     Assessment/Plan Active Problems:   Polyarthritis   Skin rash   Lactic acidosis    1. Diffuse rash involving the face upper chest and upper back with some gum bleeding -the rash started happening after patient took fluconazole and also took Protonix so could be an allergic reaction.  Since we do not have dermatology or rheumatology plan was to transfer to tertiary care center and patient has been accepted by Dr. Janell Quiet at [facility] under then patient be treated with IV steroids and Pepcid.  Closely monitor. 2. Lactic acidosis cause not clear.  Hydrate follow blood cultures empiric antibiotics for now  IV steroids.  Did discuss with pulmonary critical care.  Patient blood pressure is at this time normal. 3. Elevated LFTs appears to be chronic.  Being followed by Dr. Havery [name] gastroenterologist. 4. History of rheumatoid arthritis on chronic steroid therapy which I have dosed as IV for now. 5. Thrombocytopenia -patient has had thrombocytopenia off and on.  Follow CBC. 6. Oral thrush will try to avoid fluconazole since patient developed rash.  Since patient has significant lactic acidosis with developing and worsening rash will need close monitoring for any further worsening in inpatient status.   DVT prophylaxis: SCDs for now.  May need skin biopsy and  also has some gum bleeding avoiding anticoagulation. Code Status: Full code. Family Communication: Patient's daughter. Disposition Plan: Home in stable. Consults called: Discussed with pulmonary critical care. Admission status: Inpatient.   Rise Patience MD [facility] Pager [phone].  If 7PM-7AM, please contact night-coverage [email or web address] Password [facility]  03/23/2020, 4:14 AM

## 2020-03-23 NOTE — ED Notes (Signed)
Lunch ordered 

## 2020-03-23 NOTE — Progress Notes (Signed)
Same day note  Patient seen and examined at bedside.  Patient was admitted to the hospital for skin rash  At the time of my evaluation, patient complains of throat pain and painful swallowing, redness of eyes.  Physical examination reveals a pleasant female with erythematous eyes, facial rash, erythematous throat, rash on the body.  Thinly built.  Laboratory data and imaging was reviewed  Assessment and Plan.  Diffuse rash involving the face upper chest and upper back with some gum bleeding -possibly secondary to drug reaction.  Started after fluconazole and Protonix.  No dermatology and rheumatology service in hospital.  Admitting provider has spoken with Dr. Janell Quiet, hospitalistat [facility]  and the patient has been accepted for transfer.  Recommend treatment with IV steroids and Pepcid.    Awaiting for transfer.  70.  Negative.  HIV pending.  Blood cultures pending.  Lactic acidosis cause not clear.    Improved after hydration.  Latest lactic acid level of 1.9.  Will closely monitor.  Procalcitonin of 0.1.  Sed rate of 70.    Elevated LFTs .  Being followed by Dr. Havery [name], gastroenterologist.  Albumin 1.2.  History of rheumatoid arthritis on chronic steroid therapy.  Has been changed to IV at this time  Anemia likely from chronic disease.  Hemoglobin of 9.2 today from 16.2.  Hemoglobin 6 months back was 12.4.  No evidence of bleeding.  Patient received the IV fluids and could be component of hemodilution.  Will check occult blood. The family stated that that patient was supposed to have a GI work-up with endoscopy colonoscopy as outpatient in the GI clinic.    History of thrombocytopenia on and off.  Latest platelet count of 166.  Mild leukopenia.  Will closely monitor.  History of rheumatoid arthritis.  Patient is likely immune compromised.  On vancomycin and cefepime, will de-escalate by a.m. if no fever and cultures negative.  Oral thrush will try  to avoid fluconazole since patient developed rash.  Add Magic wash.  Spoke with the patient's daughter at bedside. No Charge  Signed,  Delila Pereyra, MD [facility]

## 2020-03-23 NOTE — Discharge Summary (Signed)
Physician Discharge Summary  Immaculate Crutcher GYF:[id number] DOB: [birth year] DOA: 03/22/2020  PCP: Azzie Glatter, FNP  Admit date: 03/22/2020 Discharge date: 03/23/2020  Admitted From: Home  Discharge disposition: Transfer to another hospital   Recommendations for Outpatient Follow-Up:   Transfer to [facility] for further evaluation of diffuse rash with bleeding in a patient with rheumatoid arthritis.  Will need rheumatology/dermatology opinion/consultation  Will need outpatient GI follow-up for elevated LFTs.  Discharge Diagnosis:   Active Problems:   Polyarthritis   Skin rash   Lactic acidosis   Discharge Condition: stable  Diet recommendation: liquid due to dysphagia.  Code status: Full.   History of Present Illness:   Kathleen Costa is a 54 y.o. female with history of rheumatoid arthritis on chronic prednisone therapy was admitted in February of this year for respiratory failure and shock has been experiencing poor appetite and generalized weakness over the last 2 months.  Had come to the ER on March 08, 2020 with oral thrush at that time was prescribed fluconazole 2 days later patient had developed some hallucination and was advised to stop taking it.  Patient also had developed some difficulty with swallowing and at the time was prescribed Protonix.  Patient despite which continue to take her fluconazole and completed 7 days course.  Patient has benign generalized body ache but over the last 5 days started having rash around her face which has become more worsened over the last few days knowing bilateral malar areas and is spreading to the neck and upper chest and upper back.  Also noticed some bleeding on the gums.  Has some difficulty swallowing.  Patient also had some difficulty breathing.  ED Course: In the ER patient was afebrile and not hypoxic.  Labs are significant for initially lactic acid of 2.7 which worsened to 7.2.  LFTs are  chronically elevated with AST of 211 ALT of 80 for which patient is being followed by Dr. Havery [name], [facility].  Sodium 136 4.  Patient has platelets of 106 hemoglobin 16.2 WBC 2.8.  Since patient has diffuse rash with unknown cause with some bleeding from the gums plan was to transfer to tertiary care center where dermatology/rheumatology is available.  Patient was accepted at [facility] but has been waitlisted due to nonavailability of beds.  Patient was then started on IV fluids empiric antibiotics and IV steroids admitted for rash and lactic acidosis.   Hospital Course:   Following conditions were addressed during hospitalization as listed below,  Diffuse rash involving the face upper chest and upper back with some gum bleeding-possibly secondary to drug reaction. off fluconazole and Protonix.  No dermatology and rheumatology service in hospital.  Admitting provider has [facility] with [facility] Janell Quiet[facility] and the patient has been accepted for transfer. On IV steroids and Pepcid.   HIV pending.  Blood cultures pending.  Will need dermatology and rheumatology follow-up after transfer.  Lactic acidosis cause not clear.   Improved after hydration.  Latest lactic acid level of 1.9.  Procalcitonin of 0.1.  Sed rate of 70.    Elevated LFTs . Being followed by Dr. Havery [name], gastroenterologist.  Albumin 1.2.  GI has seen the patient again on 03/15/2020 and recommended outpatient follow-up for elevated LFTs.  History of rheumatoid arthritis on chronic steroid therapy.  Has been changed to IV at this time.  Was on prednisone 10 mg daily at home.  Anemia likely from chronic disease.  Hemoglobin of 9.2 today from 16.2.  Hemoglobin 6 months back was 12.4.  No evidence of bleeding.  Patient received the IV fluids and could be component of hemodilution.    Seen by GI during hospitalization.  Might need outpatient GI work-up with endoscopy after  discharge.  History of thrombocytopenia on and off.  Latest platelet count of 166.  Mild leukopenia.  Will closely monitor.  History of rheumatoid arthritis.  Received a few doses of antibiotic which will be discontinued on discharge.  Oral thrush will try to avoid fluconazole since patient developed rash.  Added Magic wash.  Disposition.  At this time, patient is stable for disposition to [facility].  Spoke with the patient's daughter at bedside regarding the plan for transfer.  Medical Consultants:    GI  Procedures:    None Subjective:   Today, patient has sore throat difficulty and painful swallowing.  Discharge Exam:   Vitals:   03/23/20 1329 03/23/20 1359  BP:  118/80  Pulse:  80  Resp:  16  Temp: 98.1 F (36.7 C) 98.4 F (36.9 C)  SpO2:  100%   Vitals:   03/23/20 1245 03/23/20 1300 03/23/20 1329 03/23/20 1359  BP: 112/75 111/72  118/80  Pulse: 76 75  80  Resp: 16 17  16   Temp:   98.1 F (36.7 C) 98.4 F (36.9 C)  TempSrc:   Oral Oral  SpO2: 100% 100%  100%  Weight:    46.2 kg  Height:    5\' 1"  (1.549 m)    General: Alert awake, not in obvious distress, thinly built HENT: pupils equally reacting to light,  No scleral pallor or icterus noted. Oral mucosa is moist.  Mild lip swelling.  Throat congested and erythematous.  Pink eyes bilaterally. Chest:  Clear breath sounds.  Diminished breath sounds bilaterally. No crackles or wheezes.  CVS: S1 &S2 heard. No murmur.  Regular rate and rhythm. Abdomen: Soft, nontender, nondistended.  Bowel sounds are heard.   Extremities: No cyanosis, clubbing or edema.  Peripheral pulses are palpable. Psych: Alert, awake and oriented, normal mood CNS:  No cranial nerve deficits.  Power equal in all extremities.   Skin: Warm and dry.  Facial and body rash.  The results of significant diagnostics from this hospitalization (including imaging, microbiology, ancillary and laboratory) are listed below  for reference.     Diagnostic Studies:   DG Chest Port 1 View  Result Date: 03/22/2020 CLINICAL DATA:  54 year old female with shortness of breath. EXAM: PORTABLE CHEST 1 VIEW COMPARISON:  Chest radiograph dated 09/30/2017 FINDINGS: The lungs are clear. There is no pleural effusion pneumothorax. The cardiac silhouette is within limits. No acute osseous pathology. IMPRESSION: No active disease. Electronically Signed   By: Anner Crete M.D.   On: 03/22/2020 22:03     Labs:   Basic Metabolic Panel: Recent Labs  Lab 03/22/20 2221 03/23/20 0412  NA 134* 135  K 4.1 4.7  CL 101 104  CO2 20* 25  GLUCOSE 169* 165*  BUN 11 10  CREATININE 0.54 0.43*  CALCIUM 7.5* 7.3*   GFR Estimated Creatinine Clearance: 58 mL/min (A) (by C-G formula based on SCr of 0.43 mg/dL (L)). Liver Function Tests: Recent Labs  Lab 03/21/20 1109 03/22/20 2221 03/23/20 0412  AST 216* 211* 139*  ALT 81* 80* 59*  ALKPHOS 410* 373* 277*  BILITOT 0.3 0.5 0.4  PROT 5.8* 5.5* 4.5*  ALBUMIN 2.0* 1.6* 1.2*   Recent Labs  Lab 03/22/20 2221  LIPASE 20   No results for input(s): AMMONIA in the last 168 hours. Coagulation profile Recent Labs  Lab 03/23/20 0412  INR 0.9    CBC: Recent Labs  Lab 03/22/20 2221 03/23/20 0412  WBC 2.8* 3.0*  NEUTROABS 2.4 2.9  HGB 16.2* 9.2*  HCT 50.5* 29.8*  MCV 93.3 96.8  PLT 106* 166   Cardiac Enzymes: Recent Labs  Lab 03/21/20 1109 03/22/20 2221  CKTOTAL 103 166   BNP: Invalid input(s): POCBNP CBG: No results for input(s): GLUCAP in the last 168 hours. D-Dimer No results for input(s): DDIMER in the last 72 hours. Hgb A1c No results for input(s): HGBA1C in the last 72 hours. Lipid Profile No results for input(s): CHOL, HDL, LDLCALC, TRIG, CHOLHDL, LDLDIRECT in the last 72 hours. Thyroid function studies No results for input(s): TSH, T4TOTAL, T3FREE, THYROIDAB in the last 72 hours.  Invalid input(s): FREET3 Anemia work up No results for  input(s): VITAMINB12, FOLATE, FERRITIN, TIBC, IRON, RETICCTPCT in the last 72 hours. Microbiology Recent Results (from the past 240 hour(s))  SARS Coronavirus 2 by RT PCR (hospital order, performed in [facility] hospital lab) Nasopharyngeal Nasopharyngeal Swab     Status: None   Collection Time: 03/23/20  1:51 AM   Specimen: Nasopharyngeal Swab  Result Value Ref Range Status   SARS Coronavirus 2 NEGATIVE NEGATIVE Final    Comment: (NOTE) SARS-CoV-2 target nucleic acids are NOT DETECTED.  The SARS-CoV-2 RNA is generally detectable in upper and lower respiratory specimens during the acute phase of infection. The lowest concentration of SARS-CoV-2 viral copies this assay can detect is 250 copies / mL. A negative result does not preclude SARS-CoV-2 infection and should not be used as the sole basis for treatment or other patient management decisions.  A negative result may occur with improper specimen collection / handling, submission of specimen other than nasopharyngeal swab, presence of viral mutation(s) within the areas targeted by this assay, and inadequate number of viral copies (<250 copies / mL). A negative result must be combined with clinical observations, patient history, and epidemiological information.  Fact Sheet for Patients:   StrictlyIdeas.no  Fact Sheet for Healthcare Providers: BankingDealers.co.za  This test is not yet approved or  cleared by the [place] FDA and has been authorized for detection and/or diagnosis of SARS-CoV-2 by FDA under an Emergency Use Authorization (EUA).  This EUA will remain in effect (meaning this test can be used) for the duration of the COVID-19 declaration under Section 564(b)(1) of the Act, 21 U.S.C. section 360bbb-3(b)(1), unless the authorization is terminated or revoked sooner.  Performed at [facility], [place] [address] [place], [place] [address]      Discharge  Instructions:   Discharge Instructions    Diet - low sodium heart healthy   Complete by: As directed    Discharge instructions   Complete by: As directed    Follow up with GI as outpatient. Further care as per [facility]   Increase activity slowly   Complete by: As directed      Allergies as of 03/23/2020   No Known Allergies     Medication List    STOP taking these medications   fluconazole 200 MG tablet Commonly known as: DIFLUCAN   pantoprazole 20 MG tablet Commonly known as: PROTONIX   predniSONE 10 MG (21) Tbpk tablet Commonly known as: STERAPRED UNI-PAK 21 TAB   predniSONE 5 MG tablet Commonly known as: DELTASONE  TAKE these medications   famotidine 20-0.9 MG/50ML-% Commonly known as: PEPCID Inject 50 mLs (20 mg total) into the vein every 12 (twelve) hours.   hydrocortisone sodium succinate 100 MG Solr injection Commonly known as: SOLU-CORTEF Inject 1 mL (50 mg total) into the vein every 6 (six) hours.   magic mouthwash w/lidocaine Soln Take 10 mLs by mouth 4 (four) times daily as needed for mouth pain.         Time coordinating discharge: 39 minutes  Signed:  Saketh Daubert  [facility] 03/23/2020, 4:05 PM

## 2020-03-23 NOTE — Progress Notes (Signed)
Report was given to Patsi Sears at [facility]. All questions were answered. Iv's remained in, and carelink transported pt.

## 2020-03-23 NOTE — ED Notes (Signed)
Tele   Breakfast ordered  

## 2020-03-23 NOTE — Consult Note (Signed)
Referring Provider:  Dr. Louanne Belton, [facility] Primary Care Physician:  Azzie Glatter, FNP Primary Gastroenterologist:  Dr. Havery [name]  Reason for Consultation:  Anemia, drop in Hgb  HPI: Kathleen Costa is a 54 y.o. female with PMH of RA for which she was on prednisone 5 mg daily most recently.  She was seen and evaluated in our office on 03/14/2020 by our NP, Carl Best, for evaluation of elevated LFTs (currently stable and somewhat improved) and dysphagia/odynophagia.  Is schedule for EGD and colonoscopy (CRC screening) on 8/2 with Dr. Havery [name].  Is also scheduled for MRI liver coming up as well.  Nonetheless, according to her daughter she has become very weak recently.  The painful swallowing and difficulty swallowing has progressed and she has been unable to eat or drink anything.  Was started on fluconazole for possible thrush.  Now, over the past several days she has developed a rash on her face, chest, back, etc.  They suspect a drug rash and she has been accepted for transfer to [facility] when able.  GI was consulted for a drop in her hemoglobin.  Her baseline hemoglobin seems to run between 11 and 12 g.  On admission it was 16 g, likely hemoconcentrated from dehydration.  Now is down to 9.2 g.  She has not had no sign of overt GI bleeding here, but the patient relayed that she has been having black stools at home for the past 1 to 2 weeks.  Her daughter was unaware of this.  No sign of red rectal bleeding.  Hemoccult has been ordered.   Past Medical History:  Diagnosis Date  . Adenomyomatosis of gallbladder 10/2017   Abd U/S  . Arthritis   . Disturbance of skin sensation   . Headache   . Migraine without aura, without mention of intractable migraine without mention of status migrainosus 04/22/2014  . Rheumatoid arthritis [facility])     Past Surgical History:  Procedure Laterality Date  . CARPAL TUNNEL RELEASE Right     Prior to Admission medications   Medication Sig Start Date  End Date Taking? Authorizing Provider  predniSONE (DELTASONE) 5 MG tablet Take 10 mg by mouth daily. 01/26/20  Yes [provider]  fluconazole (DIFLUCAN) 200 MG tablet Take 1 tablet (200 mg total) by mouth daily. Patient not taking: Reported on 03/23/2020 03/08/20   Daleen Bo, MD  pantoprazole (PROTONIX) 20 MG tablet Take 1 tablet (20 mg total) by mouth daily. Patient not taking: Reported on 03/23/2020 03/10/20   Chase Picket, MD  predniSONE (STERAPRED UNI-PAK 21 TAB) 10 MG (21) TBPK tablet Take by mouth daily. Take 6 tabs by mouth daily  for 2 days, then 5 tabs for 2 days, then 4 tabs for 2 days, then 3 tabs for 2 days, 2 tabs for 2 days, then 1 tab by mouth daily for 2 days Patient not taking: Reported on 03/23/2020 02/04/20   Delia Heady, [name]  amitriptyline (ELAVIL) 25 MG tablet Take 1 tablet (25 mg total) by mouth at bedtime. Patient not taking: Reported on 02/04/2020 09/17/17 03/10/20  [name], MD  eletriptan (RELPAX) 40 MG tablet Take 1 tablet (40 mg total) by mouth 2 (two) times daily as needed. Patient not taking: Reported on 02/04/2020 04/22/14 03/10/20  [name], MD    Current Facility-Administered Medications  Medication Dose Route Frequency Provider Last Rate Last Admin  . acetaminophen (TYLENOL) tablet 650 mg  650 mg Oral Q6H PRN Rise Patience, MD  Or  . acetaminophen (TYLENOL) suppository 650 mg  650 mg Rectal Q6H PRN Rise Patience, MD      . ceFEPIme (MAXIPIME) 2 g in sodium chloride 0.9 % 100 mL IVPB  2 g Intravenous Q12H Christy Gentles, RPH      . famotidine (PEPCID) IVPB 20 mg premix  20 mg Intravenous Q12H Rise Patience, MD   Stopped at 03/23/20 0935  . hydrocortisone sodium succinate (SOLU-CORTEF) 100 MG injection 50 mg  50 mg Intravenous Q6H Rise Patience, MD   50 mg at 03/23/20 0935  . lactated ringers infusion   Intravenous Continuous Pokhrel, Laxman, MD 100 mL/hr at 03/23/20 0905 Rate Change at 03/23/20 0905    . lip balm (CARMEX) ointment   Topical PRN Pokhrel, Laxman, MD      . magic mouthwash w/lidocaine  10 mL Oral QID PRN Pokhrel, Laxman, MD      . ondansetron (ZOFRAN) tablet 4 mg  4 mg Oral Q6H PRN Rise Patience, MD       Or  . ondansetron [facility]) injection 4 mg  4 mg Intravenous Q6H PRN Rise Patience, MD      . polyvinyl alcohol (LIQUIFILM TEARS) 1.4 % ophthalmic solution 1 drop  1 drop Both Eyes PRN Pokhrel, Laxman, MD      . Derrill Memo ON 03/24/2020] vancomycin (VANCOREADY) IVPB 750 mg/150 mL  750 mg Intravenous Q24H Rise Patience, MD        Allergies as of 03/22/2020  . (No Known Allergies)    Family History  Problem Relation Age of Onset  . Migraines Mother   . Migraines Sister   . Dementia Brother   . Migraines Brother   . Migraines Brother   . Cancer - Lung Brother     Social History   Socioeconomic History  . Marital status: Married    Spouse name: Not on file  . Number of children: 2  . Years of education: college-hs  . Highest education level: Not on file  Occupational History  . Occupation: Arts development officer: [employer]  Tobacco Use  . Smoking status: Never Smoker  . Smokeless tobacco: Never Used  Vaping Use  . Vaping Use: Never used  Substance and Sexual Activity  . Alcohol use: No  . Drug use: No  . Sexual activity: Not on file  Other Topics Concern  . Not on file  Social History Narrative   Lives with    Caffeine use: coffee daily   Right handed    Social Determinants of Health   Financial Resource Strain:   . Difficulty of Paying Living Expenses:   Food Insecurity:   . Worried About Charity fundraiser in the Last Year:   . Arboriculturist in the Last Year:   Transportation Needs:   . Film/video editor (Medical):   Marland Kitchen Lack of Transportation (Non-Medical):   Physical Activity:   . Days of Exercise per Week:   . Minutes of Exercise per Session:   Stress:   . Feeling of Stress :   Social Connections:   .  Frequency of Communication with Friends and Family:   . Frequency of Social Gatherings with Friends and Family:   . Attends Religious Services:   . Active Member of Clubs or Organizations:   . Attends Archivist Meetings:   Marland Kitchen Marital Status:   Intimate Partner Violence:   . Fear of Current or Ex-Partner:   .  Emotionally Abused:   Marland Kitchen Physically Abused:   . Sexually Abused:     Review of Systems: ROS is O/W negative except as mentioned in HPI.  Physical Exam: Vital signs in last 24 hours: Temp:  [98.1 F (36.7 C)-99.1 F (37.3 C)] 98.4 F (36.9 C) (07/28 1359) Pulse Rate:  [72-127] 80 (07/28 1359) Resp:  [13-28] 16 (07/28 1359) BP: (103-145)/(70-93) 118/80 (07/28 1359) SpO2:  [92 %-100 %] 100 % (07/28 1359) Weight:  [44 kg-46.2 kg] 46.2 kg (07/28 1359)   General:  Alert, thin, somewhat uncomfortable appearing, pleasant and cooperative in NAD Head:  Normocephalic and atraumatic. Eyes:  Sclera clear, no icterus.  Conjunctiva pink. Ears:  Normal auditory acuity. Mouth:  Ulcerations noted under her tongue and on her soft palate.   Lungs:  Clear throughout to auscultation.  No wheezes, crackles, or rhonchi.  Heart:  Regular rate and rhythm; no murmurs, clicks, rubs, or gallops. Abdomen:  Soft, non-distended.  BS present.  Non-tender.   Msk:  Symmetrical without gross deformities. Pulses:  Normal pulses noted. Extremities:  Without clubbing or edema. Neurologic:  Alert and oriented x 4;  grossly normal neurologically. Skin:  Rash noted on face, chest, back. Psych:  Alert and cooperative. Normal mood and affect.  Lab Results: Recent Labs    03/22/20 2221 03/23/20 0412  WBC 2.8* 3.0*  HGB 16.2* 9.2*  HCT 50.5* 29.8*  PLT 106* 166   BMET Recent Labs    03/22/20 2221 03/23/20 0412  NA 134* 135  K 4.1 4.7  CL 101 104  CO2 20* 25  GLUCOSE 169* 165*  BUN 11 10  CREATININE 0.54 0.43*  CALCIUM 7.5* 7.3*   LFT Recent Labs    03/23/20 0412  PROT 4.5*    ALBUMIN 1.2*  AST 139*  ALT 59*  ALKPHOS 277*  BILITOT 0.4  BILIDIR 0.2  IBILI 0.2*   PT/INR Recent Labs    03/23/20 0412  LABPROT 11.8  INR 0.9   Studies/Results: DG Chest Port 1 View  Result Date: 03/22/2020 CLINICAL DATA:  54 year old female with shortness of breath. EXAM: PORTABLE CHEST 1 VIEW COMPARISON:  Chest radiograph dated 09/30/2017 FINDINGS: The lungs are clear. There is no pleural effusion pneumothorax. The cardiac silhouette is within limits. No acute osseous pathology. IMPRESSION: No active disease. Electronically Signed   By: Anner Crete M.D.   On: 03/22/2020 22:03   IMPRESSION:  *Drop in Hgb:  Hgb on admission was 16 grams, but that was likely hemoconcentrated as her baseline seems around 12 grams.  Has not been eating or drinking at home.  Hgb 9.2 grams this AM.  No overt sign of GI bleeding, but she did report to [place] some dark stools at home for the past 1-2 weeks. *Dysphagia/odynophagia:  Quite significant and she has ulcerations under her tongue and on her soft palate.  Not able to eat or drink.  Magic mouthwash with lidocaine has already been ordered.   *Elevated LFTs:  Currently undergoing outpatient evaluation.  LFTs currently stable.  This has been an ongoing issue.  Will proceed with MRI, etc, as outpatient. *Rheumatoid arthritis on Prednisone as outpatient. *Rash on face, upper chest, and upper back:  ? Drug reaction to fluconazole.  Patient has been accepted for transfer to [facility] and are awaiting that.  PLAN: -Hemoccult has been ordered. -Monitor Hgb and transfuse prn.  Monitor for signs of overt bleeding as well. -Continued outpatient evaluation of elevated LFTs.   -Will plan for EGD  tomorrow AM at 730 if the patient is still here.  If she is transferred to [facility] then procedure can be performed there.     Laban Emperor. Shalana Jardin  03/23/2020, 2:26 PM

## 2020-03-23 NOTE — Progress Notes (Signed)
Pharmacy Antibiotic Note  Kathleen Costa is a 54 y.o. female admitted on 03/22/2020 with sepsis.  Pharmacy has been consulted for vancomycin and cefepime dosing.  Patient's CrCl of 55 mL/min warrants dose increase of cefepime (she is currently at her baseline creatinine of ~0.5). Given patient's weight of 44 kg, will monitor closely and adjust dose as needed.   Plan: Increase cefepime to 2g IV q12 hours Continue vancomycin 750 mg IV q24 hours F/u renal function, cultures, and clinical course  Height: 5\' 1"  (154.9 cm) Weight: (!) 44 kg (97 lb) IBW/kg (Calculated) : 47.8  Temp (24hrs), Avg:98.8 F (37.1 C), Min:98.5 F (36.9 C), Max:99.1 F (37.3 C)  Recent Labs  Lab 03/22/20 2149 03/22/20 2221 03/23/20 0158 03/23/20 0412 03/23/20 0642 03/23/20 0816  WBC  --  2.8*  --  3.0*  --   --   CREATININE  --  0.54  --  0.43*  --   --   LATICACIDVEN 2.7*  --  7.2*  --  1.9 1.5    Estimated Creatinine Clearance: 55.2 mL/min (A) (by C-G formula based on SCr of 0.43 mg/dL (L)).    No Known Allergies  Antimicrobials this admission: 7/28 vanc >> 7/28 cefepime >>  Microbiology results: 7/28 BCx: sent  Thank you for allowing pharmacy to be a part of this patient's care.  Rebbeca Paul, PharmD PGY1 Pharmacy Resident 03/23/2020 10:01 AM  Please check [email or web address] for unit-specific pharmacy phone numbers.

## 2020-03-23 NOTE — Progress Notes (Signed)
Pharmacy Antibiotic Note  Kathleen Costa is a 54 y.o. female admitted on 03/22/2020 with sepsis.  Pharmacy has been consulted for vancomycin and cefepime dosing.  Plan: Cefepime 2gm IV X 1 then 1gm IV q12 Vancomycin 750 mg IV x 1 then 750 mg IV q24 hours F/u renal function, cultures and clinical course  Height: 5\' 1"  (154.9 cm) Weight: (!) 44 kg (97 lb) IBW/kg (Calculated) : 47.8  Temp (24hrs), Avg:98.8 F (37.1 C), Min:98.5 F (36.9 C), Max:99.1 F (37.3 C)  Recent Labs  Lab 03/22/20 2149 03/22/20 2221 03/23/20 0158  WBC  --  2.8*  --   CREATININE  --  0.54  --   LATICACIDVEN 2.7*  --  7.2*    Estimated Creatinine Clearance: 55.2 mL/min (by C-G formula based on SCr of 0.54 mg/dL).    No Known Allergies  Thank you for allowing pharmacy to be a part of this patient's care.  Excell Seltzer Poteet 03/23/2020 4:26 AM

## 2020-03-24 ENCOUNTER — Ambulatory Visit (HOSPITAL_COMMUNITY): Payer: 59

## 2020-03-24 ENCOUNTER — Other Ambulatory Visit: Payer: Self-pay | Admitting: Internal Medicine

## 2020-03-24 DIAGNOSIS — Z1231 Encounter for screening mammogram for malignant neoplasm of breast: Secondary | ICD-10-CM

## 2020-03-24 DIAGNOSIS — Z13820 Encounter for screening for osteoporosis: Secondary | ICD-10-CM

## 2020-03-24 DIAGNOSIS — R5381 Other malaise: Secondary | ICD-10-CM

## 2020-03-24 LAB — C4 COMPLEMENT: Complement C4, Body Fluid: 6 mg/dL — ABNORMAL LOW (ref 12–38)

## 2020-03-24 LAB — C3 COMPLEMENT: C3 Complement: 51 mg/dL — ABNORMAL LOW (ref 82–167)

## 2020-03-24 SURGERY — ESOPHAGOGASTRODUODENOSCOPY (EGD) WITH PROPOFOL
Anesthesia: Monitor Anesthesia Care

## 2020-03-28 ENCOUNTER — Telehealth: Payer: Self-pay | Admitting: Gastroenterology

## 2020-03-28 ENCOUNTER — Encounter: Payer: 59 | Admitting: Gastroenterology

## 2020-03-28 LAB — CULTURE, BLOOD (ROUTINE X 2)
Culture: NO GROWTH
Culture: NO GROWTH
Special Requests: ADEQUATE

## 2020-04-04 ENCOUNTER — Ambulatory Visit (HOSPITAL_COMMUNITY): Payer: 59

## 2020-04-14 ENCOUNTER — Other Ambulatory Visit: Payer: Self-pay

## 2020-04-14 ENCOUNTER — Ambulatory Visit
Admission: EM | Admit: 2020-04-14 | Discharge: 2020-04-14 | Disposition: A | Discharge status: Home / Self Care | Payer: 59

## 2020-04-14 ENCOUNTER — Encounter: Payer: Self-pay | Admitting: Emergency Medicine

## 2020-04-14 DIAGNOSIS — R079 Chest pain, unspecified: Secondary | ICD-10-CM | POA: Diagnosis not present

## 2020-04-14 HISTORY — DX: Systemic lupus erythematosus, unspecified: M32.9

## 2020-04-14 HISTORY — DX: Reserved for concepts with insufficient information to code with codable children: IMO0002

## 2020-04-14 MED ORDER — TRAMADOL HCL 50 MG PO TABS
50.0000 mg | ORAL_TABLET | Freq: Three times a day (TID) | ORAL | 0 refills | Status: AC | PRN
Start: 2020-04-14 — End: 2020-04-17

## 2020-04-14 NOTE — Discharge Instructions (Addendum)
Take pain medication as recommended. Very important follow-up with your rheumatologist next week. Go to ER for worsening chest pain, difficulty breathing, lightheadedness or dizziness, nausea, vomiting.

## 2020-04-14 NOTE — ED Provider Notes (Signed)
[facility]    CSN: [id number] Arrival date & time: 04/14/20  1458      History   Chief Complaint Chief Complaint  Patient presents with  . Chest Pain    HPI Kathleen Costa is a 54 y.o. female with headache of RA, migraine, lupus presenting for centralized chest pain since hospital discharge.  States she was there for 2 weeks: Please see attached records in patient's chart was reviewed by me at time of visit.  Patient essentially hospitalized for complicated lupus flare.  Has been compliant with medication since.  Has follow-up with her rheumatologist next week.  Patient states she ran out of her oxycodone and has had worsening chest pain.  Denies fever, lightheadedness, dizziness, palpitations, shortness of breath.  No cough, known sick contacts, rash, nausea or vomiting.    Past Medical History:  Diagnosis Date  . Adenomyomatosis of gallbladder 10/2017   Abd U/S  . Arthritis   . Disturbance of skin sensation   . Headache   . Lupus ([place])   . Migraine without aura, without mention of intractable migraine without mention of status migrainosus 04/22/2014  . Rheumatoid arthritis [facility])     Patient Active Problem List   Diagnosis Date Noted  . Skin rash 03/23/2020  . Lactic acidosis 03/23/2020  . Odynophagia   . Esophageal dysphagia   . Acute metabolic encephalopathy 67/59/1638  . Myalgia 10/01/2017  . Polyarthritis 10/01/2017  . Paresthesias 10/01/2017  . Acute hypoxemic respiratory failure ([place]) 10/01/2017  . Thickening of wall of gallbladder 10/01/2017  . Elevated liver enzymes 10/01/2017  . Hypokalemia 10/01/2017  . Lymphadenopathy 09/28/2017  . Common migraine with intractable migraine 04/22/2014    Past Surgical History:  Procedure Laterality Date  . CARPAL TUNNEL RELEASE Right     OB History   No obstetric history on file.      Home Medications    Prior to Admission medications   Medication Sig Start Date End Date Taking? Authorizing  Provider  famotidine (PEPCID) 20-0.9 MG/50ML-% Inject 50 mLs (20 mg total) into the vein every 12 (twelve) hours. 03/23/20  Yes Pokhrel, Laxman, MD  hydroxychloroquine (PLAQUENIL) 200 MG tablet Take by mouth daily.   Yes [provider]  magic mouthwash w/lidocaine SOLN Take 10 mLs by mouth 4 (four) times daily as needed for mouth pain. 03/23/20  Yes Pokhrel, Laxman, MD  mycophenolate (CELLCEPT) 500 MG tablet Take 1,000 mg by mouth 2 (two) times daily. 04/06/20  Yes [provider]  pantoprazole (PROTONIX) 40 MG tablet Take 40 mg by mouth daily. 04/06/20  Yes [provider]  predniSONE (DELTASONE) 10 MG tablet Take by mouth. 04/06/20  Yes [provider]  oxyCODONE (OXY IR/ROXICODONE) 5 MG immediate release tablet Take 5 mg by mouth 4 (four) times daily as needed. 04/06/20   [provider]  traMADol (ULTRAM) 50 MG tablet Take 1 tablet (50 mg total) by mouth every 8 (eight) hours as needed for up to 3 days. 04/14/20 04/17/20  Hall-Potvin, [place], [name]  amitriptyline (ELAVIL) 25 MG tablet Take 1 tablet (25 mg total) by mouth at bedtime. Patient not taking: Reported on 02/04/2020 09/17/17 03/10/20  [name], MD  eletriptan (RELPAX) 40 MG tablet Take 1 tablet (40 mg total) by mouth 2 (two) times daily as needed. Patient not taking: Reported on 02/04/2020 04/22/14 03/10/20  [name], MD    Family History Family History  Problem Relation Age of Onset  . Migraines Mother   .  Migraines Sister   . Dementia Brother   . Migraines Brother   . Migraines Brother   . Cancer - Lung Brother     Social History Social History   Tobacco Use  . Smoking status: Never Smoker  . Smokeless tobacco: Never Used  Vaping Use  . Vaping Use: Never used  Substance Use Topics  . Alcohol use: No  . Drug use: No     Allergies   Patient has no known allergies.   Review of Systems As per HPI   Physical Exam Triage Vital Signs ED Triage Vitals  [04/14/20 1608]  Enc Vitals Group     BP 105/64     Pulse Rate (!) 103     Resp 16     Temp 98.3 F (36.8 C)     Temp Source Oral     SpO2 97 %     Weight      Height      Head Circumference      Peak Flow      Pain Score      Pain Loc      Pain Edu?      Excl. in [place]?    No data found.  Updated Vital Signs BP 105/64   Pulse (!) 103   Temp 98.3 F (36.8 C) (Oral)   Resp 16   SpO2 97%   Visual Acuity Right Eye Distance:   Left Eye Distance:   Bilateral Distance:    Right Eye Near:   Left Eye Near:    Bilateral Near:     Physical Exam Constitutional:      General: She is not in acute distress.    Appearance: She is obese. She is not ill-appearing or diaphoretic.  HENT:     Head: Normocephalic and atraumatic.     Mouth/Throat:     Mouth: Mucous membranes are moist.     Pharynx: Oropharynx is clear. No oropharyngeal exudate or posterior oropharyngeal erythema.  Eyes:     General: No scleral icterus.    Conjunctiva/sclera: Conjunctivae normal.     Pupils: Pupils are equal, round, and reactive to light.  Neck:     Comments: Trachea midline, negative JVD Cardiovascular:     Rate and Rhythm: Normal rate and regular rhythm.     Heart sounds: No murmur heard.  No gallop.   Pulmonary:     Effort: Pulmonary effort is normal. No respiratory distress.     Breath sounds: No wheezing, rhonchi or rales.  Musculoskeletal:     Cervical back: Neck supple. No tenderness.  Lymphadenopathy:     Cervical: No cervical adenopathy.  Skin:    Capillary Refill: Capillary refill takes less than 2 seconds.     Coloration: Skin is not jaundiced or pale.     Findings: No rash.  Neurological:     General: No focal deficit present.     Mental Status: She is alert and oriented to person, place, and time.      UC Treatments / Results  Labs (all labs ordered are listed, but only abnormal results are displayed) Labs Reviewed  NOVEL CORONAVIRUS, NAA    EKG   Radiology No  results found.  Procedures Procedures (including critical care time)  Medications Ordered in UC Medications - No data to display  Initial Impression / Assessment and Plan / UC Course  I have reviewed the triage vital signs and the nursing notes.  Pertinent labs & imaging results that were  available during my care of the patient were reviewed by me and considered in my medical decision making (see chart for details).     Patient febrile, nontoxic, and hemodynamically stable in office.  EKG done office, reviewed by me and compared to previous from July 2021: NSR with ventricular rate 93 bpm.  No QTC prolongation, ST elevation or depression.  Waveforms stable in all leads: Nonacute EKG.  Reviewed signs patient verbalized understanding.  Patient requesting ice pack as this also helps: Provided.  Will follow up with rheumatology, PCP as scheduled next week, go to the ER in the interim for worsening symptoms as outlined below.  Spoke with patient's daughter and son via phone who requested refill of oxycodone.  Will defer to rheumatology at this time, amenable to tramadol in the interim.  Return precautions discussed, pt verbalized understanding and is agreeable to plan. Final Clinical Impressions(s) / UC Diagnoses   Final diagnoses:  Chest pain, unspecified type     Discharge Instructions     Take pain medication as recommended. Very important follow-up with your rheumatologist next week. Go to ER for worsening chest pain, difficulty breathing, lightheadedness or dizziness, nausea, vomiting.    ED Prescriptions    Medication Sig Dispense Auth. Provider   traMADol (ULTRAM) 50 MG tablet Take 1 tablet (50 mg total) by mouth every 8 (eight) hours as needed for up to 3 days. 9 tablet Hall-Potvin, [place], [name]     I have reviewed the PDMP during this encounter.   Hall-Potvin, [place], [place] 04/14/20 1821

## 2020-04-14 NOTE — ED Triage Notes (Signed)
Patient was in hospital for 2 weeks.  Discharged 5 days ago.  hospitalized  At [facility] [place]  Tight chest and sore throat that started this morning, yesterday was fine.  Denies vomiting

## 2020-04-18 LAB — NOVEL CORONAVIRUS, NAA: SARS-CoV-2, NAA: NOT DETECTED

## 2020-06-09 ENCOUNTER — Other Ambulatory Visit: Payer: Self-pay

## 2020-06-09 ENCOUNTER — Encounter: Payer: Self-pay | Admitting: Emergency Medicine

## 2020-06-09 ENCOUNTER — Ambulatory Visit
Admission: EM | Admit: 2020-06-09 | Discharge: 2020-06-09 | Disposition: A | Discharge status: Home / Self Care | Payer: Medicare Other | Attending: Family Medicine | Admitting: Family Medicine

## 2020-06-09 DIAGNOSIS — R21 Rash and other nonspecific skin eruption: Secondary | ICD-10-CM

## 2020-06-09 DIAGNOSIS — L299 Pruritus, unspecified: Secondary | ICD-10-CM | POA: Diagnosis not present

## 2020-06-09 DIAGNOSIS — L239 Allergic contact dermatitis, unspecified cause: Secondary | ICD-10-CM

## 2020-06-09 MED ORDER — DEXAMETHASONE SODIUM PHOSPHATE 10 MG/ML IJ SOLN
10.0000 mg | Freq: Once | INTRAMUSCULAR | Status: AC
  Administered 2020-06-09: 10 mg via INTRAMUSCULAR

## 2020-06-09 NOTE — ED Provider Notes (Signed)
[facility]   [id number] 06/09/20 Arrival Time: 1607  CC: RASH  SUBJECTIVE:  Kathleen Costa is a 54 y.o. female who presents with a skin complaint that began last week. Reports that she was working outside and was exposed to poison ivy. Has medical hx significant for lupus and RA. Denies medications change or starting a new medication recently. Localizes the rash to bilateral forearms. Describes it as red, raised and itchy. Has tried benadryl and oral steroids without relief. There are no aggravating or alleviating factors. Denies similar symptoms in the past. Denies fever, chills, nausea, vomiting, erythema, swelling, discharge, oral lesions, SOB, chest pain, abdominal pain, changes in bowel or bladder function.    ROS: As per HPI.  All other pertinent ROS negative.     Past Medical History:  Diagnosis Date  . Adenomyomatosis of gallbladder 10/2017   Abd U/S  . Arthritis   . Disturbance of skin sensation   . Headache   . Lupus ([place])   . Migraine without aura, without mention of intractable migraine without mention of status migrainosus 04/22/2014  . Rheumatoid arthritis [facility])    Past Surgical History:  Procedure Laterality Date  . CARPAL TUNNEL RELEASE Right    No Known Allergies No current facility-administered medications on file prior to encounter.   Current Outpatient Medications on File Prior to Encounter  Medication Sig Dispense Refill  . famotidine (PEPCID) 20-0.9 MG/50ML-% Inject 50 mLs (20 mg total) into the vein every 12 (twelve) hours. 50 mL   . hydroxychloroquine (PLAQUENIL) 200 MG tablet Take by mouth daily.    . magic mouthwash w/lidocaine SOLN Take 10 mLs by mouth 4 (four) times daily as needed for mouth pain.  0  . mycophenolate (CELLCEPT) 500 MG tablet Take 1,000 mg by mouth 2 (two) times daily.    Marland Kitchen oxyCODONE (OXY IR/ROXICODONE) 5 MG immediate release tablet Take 5 mg by mouth 4 (four) times daily as needed.    . pantoprazole (PROTONIX) 40 MG  tablet Take 40 mg by mouth daily.    . predniSONE (DELTASONE) 10 MG tablet Take by mouth.    . [DISCONTINUED] amitriptyline (ELAVIL) 25 MG tablet Take 1 tablet (25 mg total) by mouth at bedtime. (Patient not taking: Reported on 02/04/2020) 30 tablet 4  . [DISCONTINUED] eletriptan (RELPAX) 40 MG tablet Take 1 tablet (40 mg total) by mouth 2 (two) times daily as needed. (Patient not taking: Reported on 02/04/2020) 10 tablet 5   Social History   Socioeconomic History  . Marital status: Married    Spouse name: Not on file  . Number of children: 2  . Years of education: college-hs  . Highest education level: Not on file  Occupational History  . Occupation: Arts development officer: [employer]  Tobacco Use  . Smoking status: Never Smoker  . Smokeless tobacco: Never Used  Vaping Use  . Vaping Use: Never used  Substance and Sexual Activity  . Alcohol use: No  . Drug use: No  . Sexual activity: Not on file  Other Topics Concern  . Not on file  Social History Narrative   Lives with    Caffeine use: coffee daily   Right handed    Social Determinants of Health   Financial Resource Strain:   . Difficulty of Paying Living Expenses: Not on file  Food Insecurity:   . Worried About Charity fundraiser in the Last Year: Not on file  . Ran Out of Food in the Last  Year: Not on file  Transportation Needs:   . Lack of Transportation (Medical): Not on file  . Lack of Transportation (Non-Medical): Not on file  Physical Activity:   . Days of Exercise per Week: Not on file  . Minutes of Exercise per Session: Not on file  Stress:   . Feeling of Stress : Not on file  Social Connections:   . Frequency of Communication with Friends and Family: Not on file  . Frequency of Social Gatherings with Friends and Family: Not on file  . Attends Religious Services: Not on file  . Active Member of Clubs or Organizations: Not on file  . Attends Archivist Meetings: Not on file  . Marital  Status: Not on file  Intimate Partner Violence:   . Fear of Current or Ex-Partner: Not on file  . Emotionally Abused: Not on file  . Physically Abused: Not on file  . Sexually Abused: Not on file   Family History  Problem Relation Age of Onset  . Migraines Mother   . Migraines Sister   . Dementia Brother   . Migraines Brother   . Migraines Brother   . Cancer - Lung Brother     OBJECTIVE: Vitals:   06/09/20 0933  BP: (!) 104/52  Pulse: 83  Resp: 18  Temp: 98 F (36.7 C)  TempSrc: Oral  SpO2: 98%    General appearance: alert; no distress Head: NCAT Lungs: clear to auscultation bilaterally Heart: regular rate and rhythm.  Radial pulse 2+ bilaterally Extremities: no edema Skin: warm and dry; erythematous vesicular clustered rash to bilateral forearms and abdomen Psychological: alert and cooperative; normal mood and affect  ASSESSMENT & PLAN:  1. Rash and nonspecific skin eruption   2. Itching   3. Allergic contact dermatitis, unspecified trigger     Meds ordered this encounter  Medications  . dexamethasone (DECADRON) injection 10 mg   Contact Dermatitis Decadron 10mg  IM in office today May continue benadryl at night Continue triamcinolone at home Take as prescribed and to completion Avoid hot showers/ baths Moisturize skin daily  Follow up with PCP if symptoms persists Return or go to the ER if you have any new or worsening symptoms such as fever, chills, nausea, vomiting, redness, swelling, discharge, if symptoms do not improve with medications  Reviewed expectations re: course of current medical issues. Questions answered. Outlined signs and symptoms indicating need for more acute intervention. Patient verbalized understanding. After Visit Summary given.   Faustino Congress, NP 06/10/20 [phone]

## 2020-06-09 NOTE — ED Triage Notes (Signed)
Pt here for rash to arms from poison ivy; pt would like injection

## 2020-06-09 NOTE — Discharge Instructions (Addendum)
You have received a steroid injection in the office today  Continue with your home medication regimen  Follow up with primary care as needed  Follow up with the ER for high fever, trouble swallowing, trouble breathing, other concerning symptoms

## 2020-06-28 NOTE — Progress Notes (Addendum)
06/28/2020 Kathleen Costa [id number] 12/18/1965   Chief Complaint: follow up dysphagia/esophagitis  History of Present Illness: Kathleen Costa is a 54 year old female with a past medical history of migraine headaches, elevated LFTs and rheumatoid arthritis.  I initially saw the patient on 03/14/2020 following her evaluation in the ED for nausea, decreased appetite, dysphagia, weight loss and erythematous lesions inside her mouth. Refer to her office consult note 7/19 for a comprehensive review of her past medical history.  Laboratory studies showed alk phos level 442. AST 277. ALT 118.  Total bili 0.4.  WBC 2.8.  Hemoglobin 12.1.  Hematocrit 36.5.  Platelet 169. An abdominal MRI/MRCP, EGD and colonoscopy were ordered but not completed as she was admitted to [facility] 03/22/2020 with a worsening rash to her mouth, face and neck with swelling to her lips, bleeding from her gums, difficulty breathing, odynophagia and dysphagia.  She was transferred to [facility] 03/23/2020 for rheumatology and dermatology evaluations.  She was diagnosed with SLE with lupus nephritis.  She was treated with Plaquenil 200 mg daily, Mycophenolate 1000 mg twice daily and Prednisone 10 mg daily.  She required an NG tube with tube feedings. She underwent an EGD on 03/28/2020 which showed acute esophagitis with ulceration, biopsies were negative for fungal organisms, CMV and HSV. Her overall status gradually improved. She was discharged home on 04/06/2020.   She presents to our office today to follow up regarding dysphagia and esophagitis. She denies having any further odynophagia or dysphagia. She remains on Pantoprazole 52m QD. No upper or lower abdominal pain. Her appetite is good. She is eating 4 meals daily. Her lowest weight at time of hospital discharge was 80lbs. She weighs 103lbs today. Baseline weight is 115 lbs. She is passing normal BMs daily. No rectal bleeding or black stools.    [facility] At that time, she remained on Mycophenolate 10010mbid. Prednisone was reduced to 7.77m40mD x 14 days then 77mg54mily. Plaquenil 200mg19mwas continued.   Labs 05/27/2020: Alk phos 63. AST 19. ALT 11. T. Bili 0.4. Cr 0.46. WBC 3.1. Hg 11.7. HCT 37. MCV 94.     Current Outpatient Medications on File Prior to Visit  Medication Sig Dispense Refill  . mycophenolate (CELLCEPT) 500 MG tablet Take 1,000 mg by mouth 2 (two) times daily.    . oxyMarland KitchenODONE (OXY IR/ROXICODONE) 5 MG immediate release tablet Take 5 mg by mouth 4 (four) times daily as needed.    . pantoprazole (PROTONIX) 40 MG tablet Take 40 mg by mouth daily.    . predniSONE (DELTASONE) 10 MG tablet Take by mouth.    . [DISCONTINUED] amitriptyline (ELAVIL) 25 MG tablet Take 1 tablet (25 mg total) by mouth at bedtime. (Patient not taking: Reported on 02/04/2020) 30 tablet 4  . [DISCONTINUED] eletriptan (RELPAX) 40 MG tablet Take 1 tablet (40 mg total) by mouth 2 (two) times daily as needed. (Patient not taking: Reported on 02/04/2020) 10 tablet 5   No current facility-administered medications on file prior to visit.   No Known Allergies  Current Medications, Allergies, Past Medical History, Past Surgical History, Family History and Social History were reviewed in [employer].   Review of Systems:   Constitutional: Negative for fever, sweats, chills or weight loss.  Respiratory: Negative for shortness of breath.   Cardiovascular: Negative for chest pain, palpitations and leg swelling.  Gastrointestinal: See HPI.  Musculoskeletal: Negative for back pain or muscle aches.  Neurological: Negative for dizziness, headaches or paresthesias.    Physical Exam: There were no vitals taken for this visit.   Wt Readings from Last 3 Encounters:  06/29/20 103 lb (46.7 kg)  03/23/20 101 lb 12.8 oz (46.2 kg)  03/14/20 98 lb 4 oz (44.6 kg)    General: 54 year old female in no acute distress. Head: Normocephalic and atraumatic. Eyes: No scleral icterus. Conjunctiva pink . Ears: Normal auditory acuity. Mouth: Dentition intact. No ulcers or lesions. No facial rash. Lungs: Clear throughout to auscultation. Heart: Regular rate and rhythm, no murmur. Abdomen: Soft, nontender and nondistended. No masses or hepatomegaly. Normal bowel sounds x 4 quadrants.  Rectal: Deferred.  Musculoskeletal: Symmetrical with no gross deformities. Extremities: No edema. Neurological: Alert oriented x 4. No focal deficits.  Psychological: Alert and cooperative. Normal mood and affect  Assessment and Recommendations:  5. 54 year old female with rheumatoid arthritis, elevated LFTs consistent with chronic cholestasis without obstruction admitted to [facility] 7/28 -04/06/2020 with progressive fatigue, dysphagia, painful mouth ulcers and rash diagnosed with SLE with Lupus nephritis. Treated with IV steroids then oral Prednisone, Mycophenolate 135m po bid and Plaquenil 2048mQD. Energy level has improved. LFTs and renal function normalized. Oral rash abated. Odynophagia/dysphagia resolved.  -Continue Rheumatology follow up  2. Dysphagia secondary to esophageal ulceration per EGD 03/28/2020, resolved.  -Continue Pantoprazole 4052mD -EGD 8/2/20201 [facility] Arm[facility]very [name] verify if repeat EGD warranted   3. Weight loss, nutritional status improving. Lowest weight at time of discharge was 80lbs. Today's weight 103lbs.   4. 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 Colon cancer screening -Eventual screening colonoscopy  -Follow up in the office in 2 months   ADDENDUM:  [facility]  Diffuse esophageal ulceration with friable mucosa, several biopsies were obtained DHT was dislodged, removed, and replaced with new DHT terminating at D1 Normal-appearing stomach and  duodenum  Biopsy report: Esophageal biopsies identified acute esophagitis with ulceration. Negative for fungal organisms by special stain GMS. Negative for CMV and HSV 1 by immunohistochemistry.  See phone note 07/29/2020. Patient prefers to schedule an office appointment to further discuss EGD and colonoscopy. Hopefully her daughter will accompany her to this appointment.

## 2020-06-29 ENCOUNTER — Encounter: Payer: Self-pay | Admitting: Nurse Practitioner

## 2020-06-29 ENCOUNTER — Ambulatory Visit (INDEPENDENT_AMBULATORY_CARE_PROVIDER_SITE_OTHER): Payer: Medicare Other | Admitting: Nurse Practitioner

## 2020-06-29 VITALS — BP 102/62 | HR 62 | Ht 61.0 in | Wt 103.0 lb

## 2020-06-29 DIAGNOSIS — K2210 Ulcer of esophagus without bleeding: Secondary | ICD-10-CM

## 2020-06-29 DIAGNOSIS — M329 Systemic lupus erythematosus, unspecified: Secondary | ICD-10-CM | POA: Diagnosis not present

## 2020-06-29 DIAGNOSIS — R1319 Other dysphagia: Secondary | ICD-10-CM | POA: Diagnosis not present

## 2020-06-29 NOTE — Patient Instructions (Signed)
Continue pantoprazole 50 mg daily.   Follow up with Dr. Havery [name] in 2 months to discuss scheduling a colonoscopy.  We do not have January's schedule out right now. Please call back in a few weeks to schedule an appt for January.

## 2020-07-01 NOTE — Progress Notes (Signed)
Agree with assessment and plan as outlined. Looks like LFTs have normalized which is good.  Will await report from [facility] EGD, but repeat EGD may be reasonable if she had severe esophagitis, to ensure no underlying Barrett's, etc. We can do this at the same time as screening colonoscopy, can schedule both at her convenience. Thanks

## 2020-07-29 ENCOUNTER — Telehealth: Payer: Self-pay | Admitting: Nurse Practitioner

## 2020-07-29 ENCOUNTER — Other Ambulatory Visit: Payer: Self-pay

## 2020-07-29 NOTE — Telephone Encounter (Signed)
I would like to send her an appointment to come back and schedule both the colonoscopy and the EGD if that is okay. Her AVS instructed her to call for an office visit to schedule the colonoscopy. I can make it easier by mailing an appointment card for end of December.  No answer and no voicemail with the daughter.

## 2020-07-29 NOTE — Telephone Encounter (Signed)
Beth,  my note below is consistent with your note. I am asking you to contact the patient's daughter Dane as she drives the patient to her appointments and the patient is available only when her daughter can drive her. I am asking for patient to make a follow up appointment in the office to further discuss scheduling an EGD and colonoscopy. Thank you for attempting to contact the daughter, so it is fine to mail her a letter to contact us.

## 2020-07-29 NOTE — Telephone Encounter (Signed)
Kathleen Costa, please call the patient's daughter, Stan Head, at [phone] to schedule an office follow up appointment within the next 4 weeks to further discuss scheduling an EGD and colonoscopy. Patient speaks English fairly well face to face but phone communications is not as effective. If possible, it would be best if her daughter accompanies her  mother to this appointment. Refer to office visit 06/29/2020 and Dr. Doyne Keel addendum.   I received her EGD completed 04/02/2020 at [facility]: EGD:  Diffuse esophageal ulceration with friable mucosa, several biopsies were obtained DHT was dislodged, removed, and replaced with new DHT terminating at D1 Normal-appearing stomach and duodenum  Biopsy report: Esophageal biopsies identified acute esophagitis with ulceration. Negative for fungal organisms by special stain GMS. Negative for CMV and HSV 1 by immunohistochemistry.

## 2020-08-24 ENCOUNTER — Encounter: Payer: Self-pay | Admitting: Nurse Practitioner

## 2020-08-24 ENCOUNTER — Ambulatory Visit (INDEPENDENT_AMBULATORY_CARE_PROVIDER_SITE_OTHER): Payer: Medicare Other | Admitting: Nurse Practitioner

## 2020-08-24 VITALS — BP 80/60 | HR 88 | Ht 61.0 in | Wt 102.4 lb

## 2020-08-24 DIAGNOSIS — K2210 Ulcer of esophagus without bleeding: Secondary | ICD-10-CM | POA: Diagnosis not present

## 2020-08-24 DIAGNOSIS — Z1211 Encounter for screening for malignant neoplasm of colon: Secondary | ICD-10-CM

## 2020-08-24 MED ORDER — SUPREP BOWEL PREP KIT 17.5-3.13-1.6 GM/177ML PO SOLN: 1.0000 | ORAL | 0 refills | Status: DC

## 2020-08-24 NOTE — Patient Instructions (Signed)
If you are age 56 or older, your body mass index should be between 23-30. Your Body mass index is 19.34 kg/m. If this is out of the aforementioned range listed, please consider follow up with your Primary Care Provider.  If you are age 34 or younger, your body mass index should be between 19-25. Your Body mass index is 19.34 kg/m. If this is out of the aformentioned range listed, please consider follow up with your Primary Care Provider.    You have been scheduled for an endoscopy and colonoscopy. Please follow the written instructions given to you at your visit today. Please pick up your prep supplies at the pharmacy within the next 1-3 days. If you use inhalers (even only as needed), please bring them with you on the day of your procedure.   Please continue taking your Pantoprazole 40 MG once a day.  Follow up will be determined based on your colonoscopy and endoscopy results.  It was great seeing you today!  Thank you for entrusting me with your care and choosing [facility].  Alcide Evener, NP

## 2020-08-24 NOTE — Progress Notes (Signed)
Agree with assessment and plan as outlined.  

## 2020-08-24 NOTE — Progress Notes (Signed)
08/24/2020 Tristina Sahagian [id number] [birth year]   Chief Complaint: Schedule an EGD and colonoscopy   History of Present Illness: Shanyah Gattuso is a 54 year old female with a past medical history of migraine headaches, elevated LFTs, rheumatoid arthritis and recently diagnosed with Lupus.  I initially saw the patient in office on 03/14/2020 for further evaluation regarding odynophagia with erythematous lesions in her mouth, dysphagia and weight loss.  Refer to this consult note for comprehensive history review.  As previously reviewed she was admitted to [facility]  with a worsening rash to her mouth, face and neck with swelling to her lips, bleeding from her gums, difficulty breathing, odynophagia and dysphagia.  She was transferred to [facility] 03/23/2020 for rheumatology and dermatology evaluations.  She was diagnosed with SLE with lupus nephritis.  She was treated with Plaquenil 200 mg daily, Mycophenolate 1000 mg twice daily and Prednisone 10 mg daily.  She required an NG tube with tube feedings. She underwent an EGD on 03/28/2020 which showed acute esophagitis with ulceration, biopsies were negative for fungal organisms, CMV and HSV. Her overall status gradually improved and she discharged home on 04/06/2020.  I saw her in the office 06/29/2020 for follow-up post hospital discharge.  At that time, she was eating solid food without any difficulty.  Her weight was up to 103 pounds.  A repeat EGD and a screening colonoscopy were recommended in 2 months.  She presents today to schedule these procedures.  She is accompanied by her friend Veneta Penton who is assisting with translation as she speaks [place] as her primary language and speaks English fairly well.  She denies having any dysphagia or pain when swallowing.  She remains on Pantoprazole 40 mg once daily.  Her weight is stable at 102 to 103 pounds.  No upper or lower abdominal pain.  She is passing normal  formed brown bowel movement daily.  No rectal bleeding or black stools.  Her Lupus has significantly improved.  She is taking Prednisone 5 mg p.o. daily, Hydroxychloroquine 296m QD and Mycophenolate 10086mbid as confirmed by her daughter, Dane. She was last seen by her rheumatologist [facility]   Laboratory studies 08/12/2020: WBC 4.8.  Hemoglobin 11.3.  Hematocrit 33.9.  MCV 86.5.  Platelet 372.  Alk phos 168.  AST 24.  ALT 13.  Total bili 0.4.  Bratten 0.61.   [facility] with [facility], several biopsies were obtained DHT was dislodged, removed, and replaced with new DHT terminating at D1 Normal-appearing stomach and duodenum Biopsy report: Esophageal biopsies identified acute esophagitis with ulceration. Negative for fungal organisms by special stain GMS. Negative for CMV and HSV 1 by immunohistochemistry.  Current Outpatient Medications on File Prior to Visit  Medication Sig Dispense Refill  . amitriptyline (ELAVIL) 25 MG tablet Take 25 mg by mouth at bedtime.    . Ascorbic Acid (VITAMIN C) 1000 MG tablet Take 1,000 mg by mouth daily.    . Marland Kitchenletriptan (RELPAX) 40 MG tablet Take 40 mg by mouth 2 (two) times daily. May repeat in 2 hours if headache persists or recurs.    . hydroxychloroquine (PLAQUENIL) 200 MG tablet Take 200 mg by mouth daily.    . Marland KitchenYCOPHENOLATE MOFETIL PO Take 100 mg by mouth in the morning and at bedtime.    . Marland KitchenxyCODONE (OXY IR/ROXICODONE) 5 MG immediate release tablet Take 5 mg by mouth 4 (four) times  daily as needed.    . pantoprazole (PROTONIX) 40 MG tablet Take 40 mg by mouth daily.    . predniSONE (DELTASONE) 10 MG tablet Take by mouth.    . Vitamin D, Ergocalciferol, (DRISDOL) 1.25 MG (50000 UNIT) CAPS capsule Take 50,000 Units by mouth once a week.     No current facility-administered medications on file prior to visit.   No Known Allergies   Current Medications, Allergies,  Past Medical History, Past Surgical History, Family History and Social History were reviewed in [employer] record.   Review of Systems:   Constitutional: Negative for fever, sweats, chills or weight loss.  Respiratory: Negative for shortness of breath.   Cardiovascular: Negative for chest pain, palpitations and leg swelling.  Gastrointestinal: See HPI.  Musculoskeletal: Negative for back pain or muscle aches.  Neurological: Negative for dizziness, headaches or paresthesias.    Physical Exam: BP (!) 80/60 (BP Location: Left Arm, Patient Position: Sitting, Cuff Size: Normal)   Pulse 88   Ht '5\' 1"'  (1.549 m) Comment: height measured without shoes  Wt 102 lb 6 oz (46.4 kg)   BMI 19.34 kg/m  General: 54 year old female in no acute distress. Head: Normocephalic and atraumatic. Eyes: No scleral icterus. Conjunctiva pink . Ears: Normal auditory acuity. Mouth: Dentition intact. No ulcers or lesions.  Lungs: Clear throughout to auscultation. Heart: Regular rate and rhythm, no murmur. Abdomen: Soft, nontender and nondistended. No masses or hepatomegaly. Normal bowel sounds x 4 quadrants.  Rectal: Deferred. Musculoskeletal: Symmetrical with no gross deformities. Extremities: No edema. Neurological: Alert oriented x 4. No focal deficits.  Psychological: Alert and cooperative. Normal mood and affect  Assessment and Recommendations:  42. 54 year old female with acute esophagitis with ulceration per EGD 04/02/2020 per EGD at [facility] presents today to schedule a follow up EGD.  Dysphagia/odynophagia resolved. -Continue pantoprazole 40 mg daily -EGD benefits and risks discussed including risk with sedation, risk of bleeding, perforation and infection.  I called the patient's daughter Stan Head speaks English fluently during the patient's office appointment and I discussed the EGD procedure, benefits and risks in full detail.  2.  Colon cancer -Colonoscopy benefits and risks  discussed including risk with sedation, risk of bleeding, perforation and infection .  I called the patient's daughter during the patient's office appointment and I discussed the colonoscopy procedure, benefits and risks in full detail.  3.  SLE with lupus nephritis initially diagnosed 02/2020 -Continue follow-up with [facility]ologist [facility] include continued hepatic panel monitoring -Patient to contact her rheumatologist to verify her medication regimen as it is unclear if she is taking hydroxychloroquine 279m QD and mycophenolate 1070mpo bid as ordered.  She is taking Prednisone 8m49maily.   Further follow-up to be determined after EGD and colonoscopy completed

## 2020-09-05 ENCOUNTER — Ambulatory Visit
Admission: RE | Admit: 2020-09-05 | Discharge: 2020-09-05 | Disposition: A | Discharge status: Home / Self Care | Payer: Medicare Other | Source: Ambulatory Visit | Attending: Internal Medicine | Admitting: Internal Medicine

## 2020-09-05 ENCOUNTER — Other Ambulatory Visit: Payer: Self-pay | Admitting: Internal Medicine

## 2020-09-05 DIAGNOSIS — R059 Cough, unspecified: Secondary | ICD-10-CM

## 2020-09-07 ENCOUNTER — Ambulatory Visit (INDEPENDENT_AMBULATORY_CARE_PROVIDER_SITE_OTHER): Payer: Medicare Other | Admitting: Emergency Medicine

## 2020-09-07 ENCOUNTER — Encounter: Payer: Self-pay | Admitting: Emergency Medicine

## 2020-09-07 ENCOUNTER — Other Ambulatory Visit: Payer: Self-pay

## 2020-09-07 DIAGNOSIS — R911 Solitary pulmonary nodule: Secondary | ICD-10-CM

## 2020-09-07 MED ORDER — LEVOFLOXACIN 500 MG PO TABS: 500.0000 mg | ORAL_TABLET | Freq: Every day | ORAL | 0 refills | Status: AC

## 2020-09-07 NOTE — Assessment & Plan Note (Signed)
Right upper lobe peripheral pulmonary nodule, some central cavitation, thick-walled.  Certainly could reflect resolving pneumonia/abscess.  Also at risk for opportunistic infection, possibly TB since Kathleen Costa is from [place].  The fact that Kathleen Costa had a significant clinical improvement with levofloxacin in December is very reassuring.  I talked to her about the option of following the lesion on CT for full resolution versus proceeding to bronchoscopy now.  We decided to repeat the CT in 4 to 6 weeks to see if the lesion resolves completely.  I will give her another course of levofloxacin just for completeness in case there is any aspect of residual abscess even though Kathleen Costa has clinically improved.  If the lesion persists on repeat CT then I think Kathleen Costa needs navigational bronchoscopy for cytology and culture data especially opportunistic infection.  In the meantime I will check a QuantiFERON gold today.

## 2020-09-07 NOTE — Addendum Note (Signed)
Addended by: Gavin Potters R on: 09/07/2020 10:41 AM   Modules accepted: Orders

## 2020-09-07 NOTE — Patient Instructions (Signed)
Lab work today Please take Levaquin 500 mg once daily for 7 days We will repeat your CT scan of the chest in 4 to 6 weeks to compare with your prior. Follow Dr. Lamonte Sakai next available after your CT so that we can review, decide next steps. Get your endoscopy and colonoscopy performed as planned

## 2020-09-07 NOTE — Progress Notes (Signed)
Subjective:    Patient ID: Kathleen Costa, female    DOB: 1966/07/08, 55 y.o.   MRN: [id number]  HPI 55 year old never smoker from [place] with a history of rheumatoid arthritis and SLE on chronic prednisone 40m, mycophenylate and hydroxychloroquine (new July).  Also with history of migraines, adenomyomatosis of the gallbladder, GERD with esophagitis ? Worse when she had recent lupus flaring. She has CSY and EGD planned for 09/27/20. She is referred today for an abnormal CT scan of the chest.  She began to experience cough, fever, chills in late November 2021 also sweats.  She had headache, body aches, nasal congestion and drainage.  The cough was productive and sometimes associated with dyspnea. She had a COVID-19 test on 12/10 that was negative, flu negative.  Chest x-ray showed an irregular right upper lobe opacity and she was started on levofloxacin 12/10 for 10 days. The sweats, cough, fever all resolved. She does still feel some mid sternal tightness that can happen at any time, at rest.  No exposure to TB, has never been told that she had TB. Does not believe she had BCG.   CXR 08/05/20 >> not available to me CXR 08/23/20 >> report available > persistent irregular opacity right upper lobe without associated infiltrate edema, mass or effusion   CT chest done on 09/05/2020 (post antibiotic therapy) reviewed by me, shows a peripheral right upper lobe 1.6 x 1.4 x 1.1 nodular focus with some central cavitation, thick wall  As per HPI  Past Medical History:  Diagnosis Date  . Adenomyomatosis of gallbladder 10/2017   Abd U/S  . Arthritis   . Disturbance of skin sensation   . Headache   . Lupus ([place]   . Migraine without aura, without mention of intractable migraine without mention of status migrainosus 04/22/2014  . Rheumatoid arthritis ([place]      Family History  Problem Relation Age of Onset  . Migraines Mother   . Migraines Sister   . Dementia Brother   . Migraines Brother   .  Migraines Brother   . Cancer - Lung Brother      Social History   Socioeconomic History  . Marital status: Married    Spouse name: Not on file  . Number of children: 2  . Years of education: college-hs  . Highest education level: Not on file  Occupational History  . 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 Smoking status: Never Smoker  . Smokeless tobacco: Never Used  Vaping Use  . Vaping Use: Never used  Substance and Sexual Activity  . Alcohol use: No  . Drug use: No  . Sexual activity: Not on file  Other Topics Concern  . Not on file  Social History Narrative   Lives with    Caffeine use: coffee daily   Right handed    Social Determinants of Health   Financial Resource Strain: Not on file  Food Insecurity: Not on file  Transportation Needs: Not on file  Physical Activity: Not on file  Stress: Not on file  Social Connections: Not on file  Intimate Partner Violence: Not on file    [place]  No known TB exposure Former worked as mGlass blower/designerin a fStatistician  No pets, no mold exposure.    No Known Allergies   Outpatient Medications Prior to Visit  Medication Sig Dispense Refill  . amitriptyline (ELAVIL) 25 MG tablet Take 25  mg by mouth at bedtime.    . Ascorbic Acid (VITAMIN C) 1000 MG tablet Take 1,000 mg by mouth daily.    Marland Kitchen eletriptan (RELPAX) 40 MG tablet Take 40 mg by mouth 2 (two) times daily. May repeat in 2 hours if headache persists or recurs.    . hydroxychloroquine (PLAQUENIL) 200 MG tablet Take 200 mg by mouth daily.    Marland Kitchen MYCOPHENOLATE MOFETIL PO Take 1,000 mg by mouth in the morning and at bedtime.    Marland Kitchen oxyCODONE (OXY IR/ROXICODONE) 5 MG immediate release tablet Take 5 mg by mouth 4 (four) times daily as needed.    . pantoprazole (PROTONIX) 40 MG tablet Take 40 mg by mouth daily.    . predniSONE (DELTASONE) 10 MG tablet Take by mouth.    . Vitamin D, Ergocalciferol, (DRISDOL)  1.25 MG (50000 UNIT) CAPS capsule Take 50,000 Units by mouth once a week.    Manus Gunning BOWEL PREP KIT 17.5-3.13-1.6 GM/177ML SOLN Take 1 kit by mouth as directed. For colonoscopy prep BIN: [id number] PCN: [id number] GROUP: [id number] ID: [id number] (Patient not taking: Reported on 09/07/2020) 354 mL 0   No facility-administered medications prior to visit.         Objective:   Physical Exam Vitals:   09/07/20 0939  BP: 118/76  Pulse: 85  Temp: 98.5 F (36.9 C)  SpO2: 98%  Weight: 104 lb 12.8 oz (47.5 kg)  Height: '5\' 1"'  (1.549 m)   Gen: Pleasant, thin, in no distress,  normal affect, understands some English, communicates w assistance daughter  ENT: No lesions,  mouth clear,  oropharynx clear, no postnasal drip  Neck: No JVD, no stridor  Lungs: No use of accessory muscles, no crackles or wheezing on normal respiration, no wheeze on forced expiration  Cardiovascular: RRR, heart sounds normal, no murmur or gallops, no peripheral edema  Musculoskeletal: No deformities, no cyanosis or clubbing  Neuro: alert, awake, non focal  Skin: Warm, no lesions or rash      Assessment & Plan:  Pulmonary nodule Right upper lobe peripheral pulmonary nodule, some central cavitation, thick-walled.  Certainly could reflect resolving pneumonia/abscess.  Also at risk for opportunistic infection, possibly TB since she is from [place].  The fact that she had a significant clinical improvement with levofloxacin in December is very reassuring.  I talked to her about the option of following the lesion on CT for full resolution versus proceeding to bronchoscopy now.  We decided to repeat the CT in 4 to 6 weeks to see if the lesion resolves completely.  I will give her another course of levofloxacin just for completeness in case there is any aspect of residual abscess even though she has clinically improved.  If the lesion persists on repeat CT then I think she needs navigational bronchoscopy for cytology and culture  data especially opportunistic infection.  In the meantime I will check a QuantiFERON gold today.     Baltazar Apo, MD, PhD 09/07/2020, 10:32 AM [facility] [phone] or if no answer [phone]

## 2020-09-09 LAB — QUANTIFERON-TB GOLD PLUS
Mitogen-NIL: 5.54 IU/mL
NIL: 0.04 IU/mL
QuantiFERON-TB Gold Plus: NEGATIVE
TB1-NIL: <0 IU/mL
TB2-NIL: <0 IU/mL

## 2020-09-21 ENCOUNTER — Telehealth: Payer: Self-pay | Admitting: Emergency Medicine

## 2020-09-21 NOTE — Telephone Encounter (Signed)
Primary Pulmonologist: RB Last office visit and with whom: 09/07/20 with RB What do we see them for (pulmonary problems): pulmonary nodule Last OV assessment/plan: Assessment & Plan Note by Collene Gobble, MD at 09/07/2020 10:30 AM  Author: Collene Gobble, MD Author Type: Physician Filed: 09/07/2020 10:32 AM  Note Status: Written Cosign: Cosign Not Required Encounter Date: 09/07/2020  Problem: Pulmonary nodule  Editor: Collene Gobble, MD (Physician)               Right upper lobe peripheral pulmonary nodule, some central cavitation, thick-walled.  Certainly could reflect resolving pneumonia/abscess.  Also at risk for opportunistic infection, possibly TB since she is from [place].  The fact that she had a significant clinical improvement with levofloxacin in December is very reassuring.  I talked to her about the option of following the lesion on CT for full resolution versus proceeding to bronchoscopy now.  We decided to repeat the CT in 4 to 6 weeks to see if the lesion resolves completely.  I will give her another course of levofloxacin just for completeness in case there is any aspect of residual abscess even though she has clinically improved.  If the lesion persists on repeat CT then I think she needs navigational bronchoscopy for cytology and culture data especially opportunistic infection.  In the meantime I will check a QuantiFERON gold today.     Patient Instructions by Collene Gobble, MD at 09/07/2020 10:00 AM Author: Collene Gobble, MD Author Type: Physician Filed: 09/07/2020 10:29 AM  Note Status: Signed Cosign: Cosign Not Required Encounter Date: 09/07/2020  Editor: Collene Gobble, MD (Physician)               Lab work today Please take Levaquin 500 mg once daily for 7 days We will repeat your CT scan of the chest in 4 to 6 weeks to compare with your prior. Follow Dr. Lamonte Sakai next available after your CT so that we can review, decide next steps. Get your endoscopy and colonoscopy  performed as planned       Was appointment offered to patient (explain)?  Pt and daughter Dane want recommendations   Reason for call: Called and spoke with pt's daughter Dane who stated pt began to have a reaction to the levaquin. Pt started taking the levaquin 09/07/20 and last dose was taken today 09/21/20. Due to [place] not being at home with pt, she called her brother who was at home with pt and did a three-way conversation. Per pt's son, pt was feeling uncomfortable while taking the abx and stated that she did not take it every day in a row as she was skipping days while taking it due to how it was making her feel.  Pt has developed a rash, pt's face is red as well as her neck and also pt is itching real bad from it. Pt also has redness to eyes, a mild cough, some tightness in chest, and also has some increased SOB. Pt's son stated that pt was nauseous some this morning but that has subisded.  Sarah, please advise any recommendations for pt and her son and daughter.  No Known Allergies  Immunization History  Administered Date(s) Administered  . Zoster Recombinat (Shingrix) 02/07/2018, 04/09/2018

## 2020-09-21 NOTE — Telephone Encounter (Signed)
Pt. Needs to go to the ED or urgent care to be evaluated.

## 2020-09-21 NOTE — Telephone Encounter (Signed)
Called and spoke with pts daughter Stan Head, and she is aware that per SG she will need to be seen in ER or UC for evaluation.  She voiced her understanding and nothing further is needed.

## 2020-09-22 ENCOUNTER — Ambulatory Visit: Payer: 59 | Admitting: Nurse Practitioner

## 2020-09-27 ENCOUNTER — Encounter: Payer: Medicare Other | Admitting: Gastroenterology

## 2020-10-05 ENCOUNTER — Ambulatory Visit
Admission: RE | Admit: 2020-10-05 | Discharge: 2020-10-05 | Disposition: A | Discharge status: Home / Self Care | Payer: Medicare Other | Source: Ambulatory Visit | Attending: Emergency Medicine | Admitting: Emergency Medicine

## 2020-10-05 DIAGNOSIS — R911 Solitary pulmonary nodule: Secondary | ICD-10-CM

## 2020-10-12 ENCOUNTER — Ambulatory Visit (INDEPENDENT_AMBULATORY_CARE_PROVIDER_SITE_OTHER): Payer: Medicare Other | Admitting: Emergency Medicine

## 2020-10-12 ENCOUNTER — Other Ambulatory Visit: Payer: Self-pay

## 2020-10-12 ENCOUNTER — Encounter: Payer: Self-pay | Admitting: Emergency Medicine

## 2020-10-12 DIAGNOSIS — R911 Solitary pulmonary nodule: Secondary | ICD-10-CM

## 2020-10-12 NOTE — Progress Notes (Signed)
Subjective:    Patient ID: Kathleen Costa, female    DOB: November 07, 1965, 55 y.o.   MRN: [id number]  HPI 55 year old never smoker from [place] with a history of rheumatoid arthritis and SLE on chronic prednisone 77m, mycophenylate and hydroxychloroquine (new July).  Also with history of migraines, adenomyomatosis of the gallbladder, GERD with esophagitis ? Worse when she had recent lupus flaring. She has CSY and EGD planned for 09/27/20. She is referred today for an abnormal CT scan of the chest.  She began to experience cough, fever, chills in late November 2021 also sweats.  She had headache, body aches, nasal congestion and drainage.  The cough was productive and sometimes associated with dyspnea. She had a COVID-19 test on 12/10 that was negative, flu negative.  Chest x-ray showed an irregular right upper lobe opacity and she was started on levofloxacin 12/10 for 10 days. The sweats, cough, fever all resolved. She does still feel some mid sternal tightness that can happen at any time, at rest.  No exposure to TB, has never been told that she had TB. Does not believe she had BCG.   CXR 08/05/20 >> not available to me CXR 08/23/20 >> report available > persistent irregular opacity right upper lobe without associated infiltrate edema, mass or effusion   CT chest done on 09/05/2020 (post antibiotic therapy) reviewed by me, shows a peripheral right upper lobe 1.6 x 1.4 x 1.1 nodular focus with some central cavitation, thick wall  ROV 10/12/20 --follow-up visit for 55year old woman with history rheumatoid arthritis and SLE on chronic prednisone, mycophenolate, hydroxychloroquine. She is currently off the mycophenolate post-COVID.  She also has GERD, chronic migraines.  I saw her for right upper lobe peripheral pulmonary nodule with some central cavitation noted on CT chest performed 09/05/2020.  This CT was done after treated with antibiotics for presumed pneumonia.  QuantiFERON done 09/07/2020 negative.   Since last time she was hospitalized with COVID-19 1/26 as above.  I repeated her CT chest 10/05/2020 and have reviewed, shows shows interval decrease in size of her right upper lobe pulmonary nodule consistent with resolving infection versus inflammation, stable bilateral mediastinal and axillary lymph node enlargement.   As per HPI  Past Medical History:  Diagnosis Date  . Adenomyomatosis of gallbladder 10/2017   Abd U/S  . Arthritis   . Disturbance of skin sensation   . Headache   . [place]upus [place]   . Migraine without aura, without mention of intractable migraine without mention of status migrainosus 04/22/2014  . [place]      Family History  Problem Relation Age of Onset  . Migraines Mother   . Migraines Sister   . Dementia Brother   . Migraines Brother   . Migraines Brother   . Cancer - Lung Brother      Social History   Socioeconomic History  . Marital status: Married    Spouse name: Not on file  . Number of children: 2  . Years of education: college-hs  . Highest education level: Not on file  Occupational History  . 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 Smoking status: Never Smoker  . Smokeless tobacco: Never Used  Vaping Use  . Vaping Use: Never used  Substance and Sexual Activity  . Alcohol use: No  . Drug use: No  . Sexual activity: Not on file  Other Topics Concern  . Not on file  Social History Narrative   Lives  with    Caffeine use: coffee daily   Right handed    Social Determinants of Health   Financial Resource Strain: Not on file  Food Insecurity: Not on file  Transportation Needs: Not on file  Physical Activity: Not on file  Stress: Not on file  Social Connections: Not on file  Intimate Partner Violence: Not on file    From [place], came to the [place] in 1989.  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  No pets, no mold exposure.    No Known Allergies    Outpatient Medications Prior to Visit  Medication Sig Dispense Refill  . amitriptyline (ELAVIL) 25 MG tablet Take 25 mg by mouth at bedtime.    . hydroxychloroquine (PLAQUENIL) 200 MG tablet Take 200 mg by mouth daily.    . predniSONE (DELTASONE) 10 MG tablet Take by mouth.    . Vitamin D, Ergocalciferol, (DRISDOL) 1.25 MG (50000 UNIT) CAPS capsule Take 50,000 Units by mouth once a week.    . Ascorbic Acid (VITAMIN C) 1000 MG tablet Take 1,000 mg by mouth daily. (Patient not taking: Reported on 10/12/2020)    . eletriptan (RELPAX) 40 MG tablet Take 40 mg by mouth 2 (two) times daily. May repeat in 2 hours if headache persists or recurs. (Patient not taking: Reported on 10/12/2020)    . MYCOPHENOLATE MOFETIL PO Take 1,000 mg by mouth in the morning and at bedtime. (Patient not taking: Reported on 10/12/2020)    . oxyCODONE (OXY IR/ROXICODONE) 5 MG immediate release tablet Take 5 mg by mouth 4 (four) times daily as needed. (Patient not taking: Reported on 10/12/2020)    . pantoprazole (PROTONIX) 40 MG tablet Take 40 mg by mouth daily. (Patient not taking: Reported on 10/12/2020)    . SUPREP BOWEL PREP KIT 17.5-3.13-1.6 GM/177ML SOLN Take 1 kit by mouth as directed. For colonoscopy prep BIN: [id number] PCN: CN GROUP: [id number] ID: [id number] (Patient not taking: Reported on 10/12/2020) 354 mL 0   No facility-administered medications prior to visit.         Objective:   Physical Exam Vitals:   10/12/20 1158  BP: 112/66  Pulse: 96  Temp: 97.6 F (36.4 C)  TempSrc: Temporal  SpO2: 99%  Weight: 105 lb 12.8 oz (48 kg)  Height: '5\' 1"'  (1.549 m)   Gen: Pleasant, thin, in no distress,  normal affect, understands some English, communicates w assistance daughter  ENT: No lesions,  mouth clear,  oropharynx clear, no postnasal drip  Neck: No JVD, no stridor  Lungs: No use of accessory muscles, no crackles or wheezing on normal respiration, no wheeze on forced expiration  Cardiovascular: RRR, heart  sounds normal, no murmur or gallops, no peripheral edema  Musculoskeletal: No deformities, no cyanosis or clubbing  Neuro: alert, awake, non focal  Skin: Warm, no lesions or rash      Assessment & Plan:  Pulmonary nodule Resolving on repeat CT chest.  I suspect that this was a partially treated community-acquired pneumonia with a small lung abscess.  QuantiFERON gold was negative.  Consider also related to her rheumatoid disease, rheumatoid nodule.  It is now resolving.  Plan to repeat her CT chest in 1 year to ensure interval stability especially given the rheumatoid disease.     Baltazar Apo, MD, PhD 10/12/2020, 12:19 PM [facility] [phone] or if no answer [phone]

## 2020-10-12 NOTE — Patient Instructions (Signed)
Repeat CT chest without contrast in February 2023 to follow your small pulmonary nodule.  I suspect that this was a resolving pneumonia that was treated effectively with antibiotics. Follow Dr. Lamonte Sakai in February after your CT chest so that we can review the results together.

## 2020-10-12 NOTE — Addendum Note (Signed)
Addended by: Gavin Potters R on: 10/12/2020 12:21 PM   Modules accepted: Orders

## 2020-10-12 NOTE — Assessment & Plan Note (Signed)
Resolving on repeat CT chest.  I suspect that this was a partially treated community-acquired pneumonia with a small lung abscess.  QuantiFERON gold was negative.  Consider also related to her rheumatoid disease, rheumatoid nodule.  It is now resolving.  Plan to repeat her CT chest in 1 year to ensure interval stability especially given the rheumatoid disease.

## 2020-11-17 ENCOUNTER — Other Ambulatory Visit: Payer: Self-pay

## 2020-11-17 ENCOUNTER — Ambulatory Visit (AMBULATORY_SURGERY_CENTER): Payer: Self-pay

## 2020-11-17 VITALS — Ht 61.0 in | Wt 107.0 lb

## 2020-11-17 DIAGNOSIS — K2210 Ulcer of esophagus without bleeding: Secondary | ICD-10-CM

## 2020-11-17 DIAGNOSIS — Z1211 Encounter for screening for malignant neoplasm of colon: Secondary | ICD-10-CM

## 2020-11-17 NOTE — Progress Notes (Signed)
No egg or soy allergy known to patient  No issues with past sedation with any surgeries or procedures Patient denies ever being told they had issues or difficulty with intubation  No FH of Malignant Hyperthermia No diet pills per patient No home 02 use per patient  No blood thinners per patient  Pt denies issues with constipation  No A fib or A flutter  COVID 19 guidelines implemented in [facility] today with Pt and RN  + COVID on 08/2020-tx'd at [facility] given to pt in [facility] today , Code to Pharmacy and  NO PA's for preps discussed with pt In PV today  Discussed with pt there will be an out-of-pocket cost for prep and that varies from $0 to 70 dollars;  Due to the COVID-19 pandemic we are asking patients to follow certain guidelines.   Pt aware of COVID protocols and [place] guidelines  Patient reports she has the suprep at home;  Patient refused and reported that she does not need an interpreter;

## 2020-11-25 ENCOUNTER — Other Ambulatory Visit: Payer: Self-pay

## 2020-11-25 ENCOUNTER — Encounter: Payer: Self-pay | Admitting: Gastroenterology

## 2020-11-25 ENCOUNTER — Ambulatory Visit (AMBULATORY_SURGERY_CENTER): Payer: Medicare Other | Admitting: Gastroenterology

## 2020-11-25 VITALS — BP 125/83 | HR 77 | Temp 97.1°F | Resp 18 | Ht 61.0 in | Wt 107.0 lb

## 2020-11-25 DIAGNOSIS — K2970 Gastritis, unspecified, without bleeding: Secondary | ICD-10-CM

## 2020-11-25 DIAGNOSIS — K2990 Gastroduodenitis, unspecified, without bleeding: Secondary | ICD-10-CM

## 2020-11-25 DIAGNOSIS — Z1211 Encounter for screening for malignant neoplasm of colon: Secondary | ICD-10-CM | POA: Diagnosis not present

## 2020-11-25 DIAGNOSIS — K2210 Ulcer of esophagus without bleeding: Secondary | ICD-10-CM

## 2020-11-25 DIAGNOSIS — K3189 Other diseases of stomach and duodenum: Secondary | ICD-10-CM

## 2020-11-25 MED ORDER — SODIUM CHLORIDE 0.9 % IV SOLN: 500.0000 mL | Freq: Once | INTRAVENOUS | Status: DC

## 2020-11-25 NOTE — Progress Notes (Signed)
Called to room to assist during endoscopic procedure.  Patient ID and intended procedure confirmed with present staff. Received instructions for my participation in the procedure from the performing physician.  

## 2020-11-25 NOTE — Op Note (Signed)
[name] Procedure Date: 11/25/2020 2:30 PM MRN: [id number] Endoscopist: Remo Lipps P. Havery [name] , MD Age: 55 Referring MD:  Date of Birth: 1966-02-03 Gender: Female Account #: [id number] Procedure:                Colonoscopy Indications:              Screening for colorectal malignant neoplasm, This                            is the patient's first colonoscopy Medicines:                Monitored Anesthesia Care Procedure:                Pre-Anesthesia Assessment:                           - Prior to the procedure, a History and Physical                            was performed, and patient medications and                            allergies were reviewed. The patient's tolerance of                            previous anesthesia was also reviewed. The risks                            and benefits of the procedure and the sedation                            options and risks were discussed with the patient.                            All questions were answered, and informed consent                            was obtained. Prior Anticoagulants: The patient has                            taken no previous anticoagulant or antiplatelet                            agents. ASA Grade Assessment: II - A patient with                            mild systemic disease. After reviewing the risks                            and benefits, the patient was deemed in                            satisfactory condition to undergo the procedure.  After obtaining informed consent, the colonoscope                            was passed under direct vision. Throughout the                            procedure, the patient's blood pressure, pulse, and                            oxygen saturations were monitored continuously. The                            Olympus PCF-H190DL ([id number]) Colonoscope was                            introduced through  the anus and advanced to the the                            cecum, identified by appendiceal orifice and                            ileocecal valve. The colonoscopy was performed                            without difficulty. The patient tolerated the                            procedure well. The quality of the bowel                            preparation was good. The ileocecal valve,                            appendiceal orifice, and rectum were photographed. Scope In: 2:48:46 PM Scope Out: 3:02:08 PM Scope Withdrawal Time: 0 hours 11 minutes 16 seconds  Total Procedure Duration: 0 hours 13 minutes 22 seconds  Findings:                 The perianal and digital rectal examinations were                            normal.                           Anal papilla(e) were hypertrophied.                           Internal hemorrhoids were found during                            retroflexion. The hemorrhoids were small.                           The exam was otherwise without abnormality. Complications:            No immediate complications. Estimated blood loss:  None. Estimated Blood Loss:     Estimated blood loss: none. Impression:               - Anal papilla(e) were hypertrophied.                           - Internal hemorrhoids.                           - The examination was otherwise normal.                           - No polyps. Recommendation:           - Patient has a contact number available for                            emergencies. The signs and symptoms of potential                            delayed complications were discussed with the                            patient. Return to normal activities tomorrow.                            Written discharge instructions were provided to the                            patient.                           - Resume previous diet.                           - Continue present medications.                            - Repeat colonoscopy in 10 years for screening                            purposes. Remo Lipps P. Gracelee Stemmler, MD 11/25/2020 3:06:46 PM This report has been signed electronically.

## 2020-11-25 NOTE — Patient Instructions (Signed)
Handout on gastritis given. Can reduce Protonix to 20mg  daily, then take as needed.     YOU HAD AN ENDOSCOPIC PROCEDURE TODAY AT [facility]:   Refer to the procedure report that was given to you for any specific questions about what was found during the examination.  If the procedure report does not answer your questions, please call your gastroenterologist to clarify.  If you requested that your care partner not be given the details of your procedure findings, then the procedure report has been included in a sealed envelope for you to review at your convenience later.  YOU SHOULD EXPECT: Some feelings of bloating in the abdomen. Passage of more gas than usual.  Walking can help get rid of the air that was put into your GI tract during the procedure and reduce the bloating. If you had a lower endoscopy (such as a colonoscopy or flexible sigmoidoscopy) you may notice spotting of blood in your stool or on the toilet paper. If you underwent a bowel prep for your procedure, you may not have a normal bowel movement for a few days.  Please Note:  You might notice some irritation and congestion in your nose or some drainage.  This is from the oxygen used during your procedure.  There is no need for concern and it should clear up in a day or so.  SYMPTOMS TO REPORT IMMEDIATELY:   Following lower endoscopy (colonoscopy or flexible sigmoidoscopy):  Excessive amounts of blood in the stool  Significant tenderness or worsening of abdominal pains  Swelling of the abdomen that is new, acute  Fever of 100F or higher   Following upper endoscopy (EGD)  Vomiting of blood or coffee ground material  New chest pain or pain under the shoulder blades  Painful or persistently difficult swallowing  New shortness of breath  Fever of 100F or higher  Black, tarry-looking stools  For urgent or emergent issues, a gastroenterologist can be reached at any hour by calling [phone]. Do not use  MyChart messaging for urgent concerns.    DIET:  We do recommend a small meal at first, but then you may proceed to your regular diet.  Drink plenty of fluids but you should avoid alcoholic beverages for 24 hours.  ACTIVITY:  You should plan to take it easy for the rest of today and you should NOT DRIVE or use heavy machinery until tomorrow (because of the sedation medicines used during the test).    FOLLOW UP: Our staff will call the number listed on your records 48-72 hours following your procedure to check on you and address any questions or concerns that you may have regarding the information given to you following your procedure. If we do not reach you, we will leave a message.  We will attempt to reach you two times.  During this call, we will ask if you have developed any symptoms of COVID 19. If you develop any symptoms (ie: fever, flu-like symptoms, shortness of breath, cough etc.) before then, please call [phone].  If you test positive for Covid 19 in the 2 weeks post procedure, please call and report this information to [place].    If any biopsies were taken you will be contacted by phone or by letter within the next 1-3 weeks.  Please call us at [phone] if you have not heard about the biopsies in 3 weeks.    SIGNATURES/CONFIDENTIALITY: You and/or your care partner have signed paperwork which will be entered  into your electronic medical record.  These signatures attest to the fact that that the information above on your After Visit Summary has been reviewed and is understood.  Full responsibility of the confidentiality of this discharge information lies with you and/or your care-partner.

## 2020-11-25 NOTE — Progress Notes (Signed)
VS taken by [place] 

## 2020-11-25 NOTE — Op Note (Signed)
[facility] Patient Name: Kathleen Costa Procedure Date: 11/25/2020 2:30 PM MRN: [id number] Endoscopist: Kathleen Costa , MD Age: 55 Referring MD:  Date of Birth: 1966-06-27 Gender: Female Account #: [id number] Procedure:                Upper GI endoscopy Indications:              Follow-up of severe esophagitis noted during prior                            hospitalization in 2021, on protonix, asymptomatic                            at this time Medicines:                Monitored Anesthesia Care Procedure:                Pre-Anesthesia Assessment:                           - Prior to the procedure, a History and Physical                            was performed, and patient medications and                            allergies were reviewed. The patient's tolerance of                            previous anesthesia was also reviewed. The risks                            and benefits of the procedure and the sedation                            options and risks were discussed with the patient.                            All questions were answered, and informed consent                            was obtained. Prior Anticoagulants: The patient has                            taken no previous anticoagulant or antiplatelet                            agents. ASA Grade Assessment: II - A patient with                            mild systemic disease. After reviewing the risks                            and benefits, the patient was deemed in  satisfactory condition to undergo the procedure.                           After obtaining informed consent, the endoscope was                            passed under direct vision. Throughout the                            procedure, the patient's blood pressure, pulse, and                            oxygen saturations were monitored continuously. The                            Endoscope was introduced through  the mouth, and                            advanced to the second part of duodenum. The upper                            GI endoscopy was accomplished without difficulty.                            The patient tolerated the procedure well. Scope In: Scope Out: Findings:                 The Z-line was regular.                           The exam of the esophagus was otherwise normal.                            Interval healing of esophagitis.                           Patchy mild inflammation characterized by erythema                            and granularity and a few focal erosions was found                            in the gastric body and in the gastric antrum.                           The exam of the stomach was otherwise normal.                           Biopsies were taken with a cold forceps in the                            gastric body, at the incisura and in the gastric  antrum for Helicobacter pylori testing.                           The duodenal bulb and second portion of the                            duodenum were normal. Complications:            No immediate complications. Estimated blood loss:                            Minimal. Estimated Blood Loss:     Estimated blood loss was minimal. Impression:               - Z-line regular.                           - Normal esophagus otherwise                           - Mild gastritis.                           - Normal stomach otherwise. Biopsies taken to rule                            out H pylori                           - Normal duodenal bulb and second portion of the                            duodenum.                           Overall, good interval healing of esophagitis on PPI Recommendation:           - Patient has a contact number available for                            emergencies. The signs and symptoms of potential                            delayed complications were discussed with  the                            patient. Return to normal activities tomorrow.                            Written discharge instructions were provided to the                            patient.                           - Resume previous diet.                           -  Continue present medications.                           - Can reduce protonix to 20mg  / day and then take                            as needed moving forward                           - Await pathology results. Kathleen Lipps P. Kathleen Rakestraw, MD 11/25/2020 3:12:59 PM This report has been signed electronically.

## 2020-11-25 NOTE — Progress Notes (Signed)
Pt's states no medical or surgical changes since previsit or office visit. 

## 2020-11-25 NOTE — Progress Notes (Signed)
Report to PACU, RN, vss, BBS= Clear.  

## 2020-11-29 ENCOUNTER — Telehealth: Payer: Self-pay | Admitting: *Deleted

## 2020-11-29 NOTE — Telephone Encounter (Signed)
  Follow up Call-  Call back number 11/25/2020  Post procedure Call Back phone  # [phone]  Permission to leave phone message Yes  Some recent data might be hidden     Patient questions:  Do you have a fever, pain , or abdominal swelling? No. Pain Score  0 *  Have you tolerated food without any problems? Yes.    Have you been able to return to your normal activities? Yes.    Do you have any questions about your discharge instructions: Diet   No. Medications  No. Follow up visit  No.  Do you have questions or concerns about your Care? No.  Actions: * If pain score is 4 or above: No action needed, pain <4.  1. Have you developed a fever since your procedure? no  2.   Have you had an respiratory symptoms (SOB or cough) since your procedure? no  3.   Have you tested positive for COVID 19 since your procedure no  4.   Have you had any family members/close contacts diagnosed with the COVID 19 since your procedure? no   If yes to any of these questions please route to Joylene John, RN and Joella Prince, RN

## 2021-08-23 IMAGING — CT CT CHEST SUPER D W/O CM
2 of 5 series · 15 of 36 positions shown, 18 images · non-contrast
Comparison: Chest CT 09/05/2020 and 09/28/2017.

CLINICAL DATA: Right upper lobe pulmonary nodule post antibiotic
therapy. No previous relevant surgery or history of malignancy.

EXAM:
CT CHEST WITHOUT CONTRAST
TECHNIQUE: Multidetector CT imaging of the chest was performed using thin slice
collimation for electromagnetic bronchoscopy planning purposes,
without intravenous contrast.

[Series 4: chest 2.00 br40 s3 · coronal · 0.52mm/px · 3 of 143 slices shown]
[im 29/143  lung]
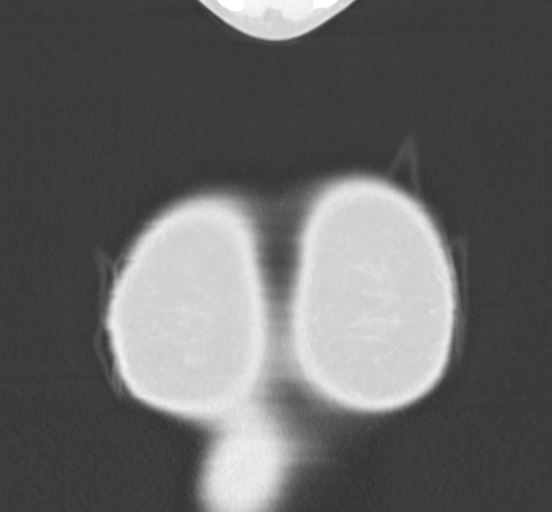
[im 57/143  lung]
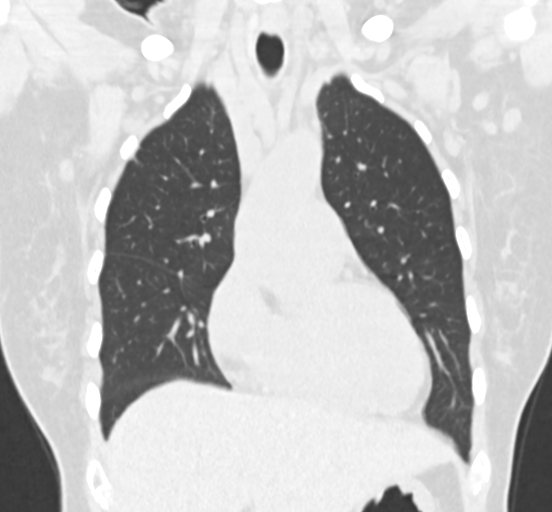
[im 86/143  lung]
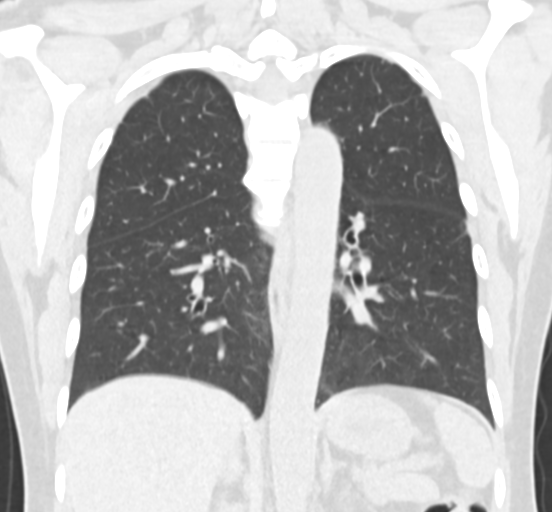

[Series 10: chest 1.00 br40 s3 super d · axial · 0.56mm/px · z∈[+1638,+1866]mm · 12 of 331 slices shown, 15 images]
[im 23/331  mediastinal]
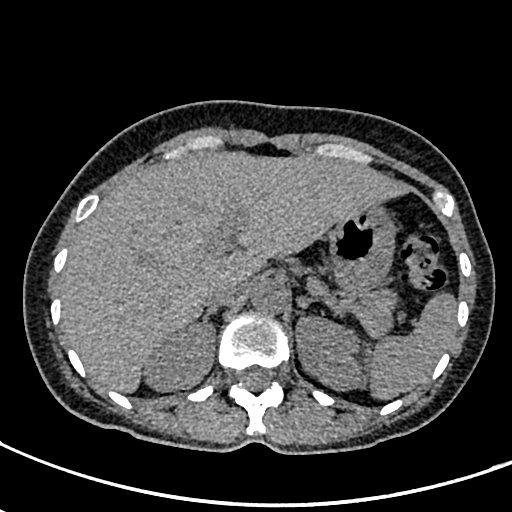
[im 23/331  lung]
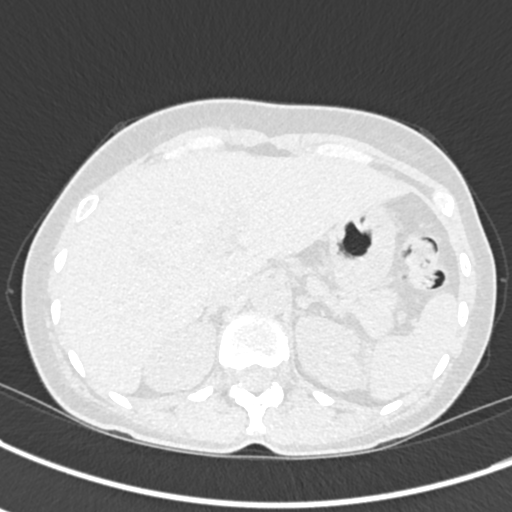
[im 45/331  lung]
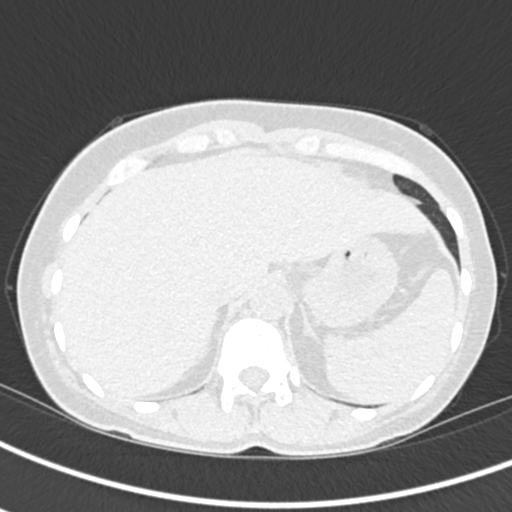
[im 67/331  lung]
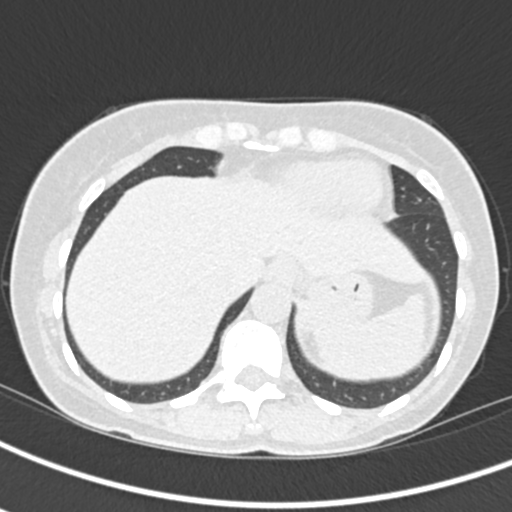
[im 111/331  lung]
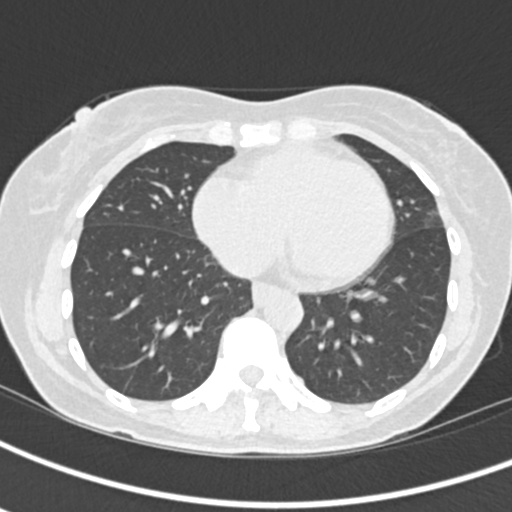
[im 133/331  mediastinal]
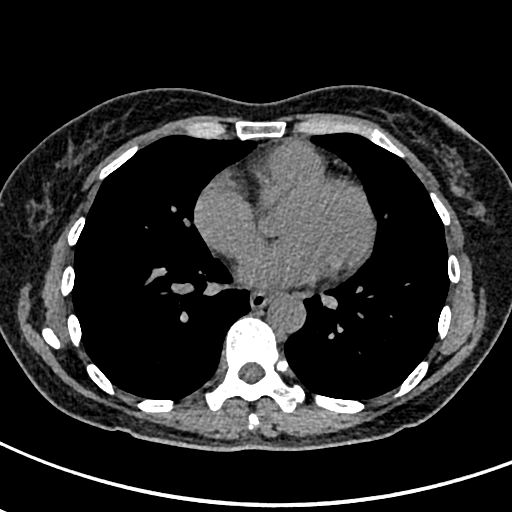
[im 133/331  lung]
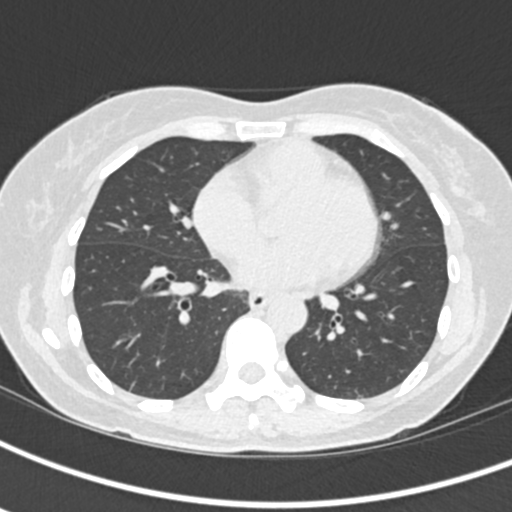
[im 155/331  lung]
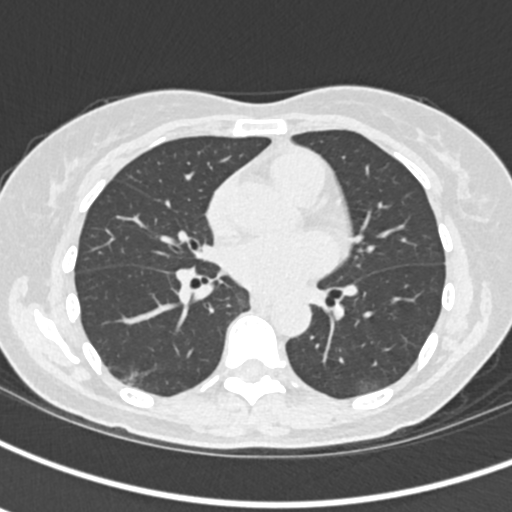
[im 177/331  lung]
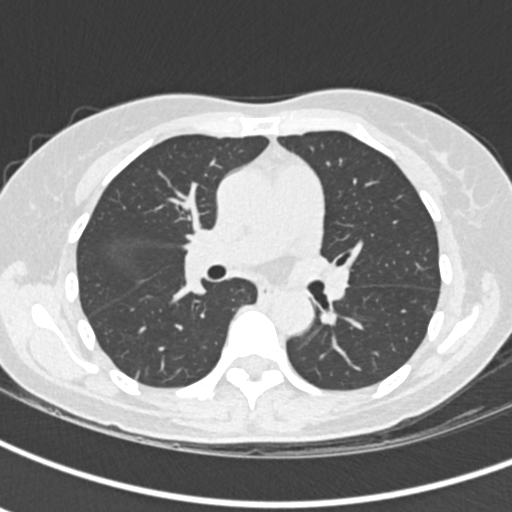
[im 199/331  lung]
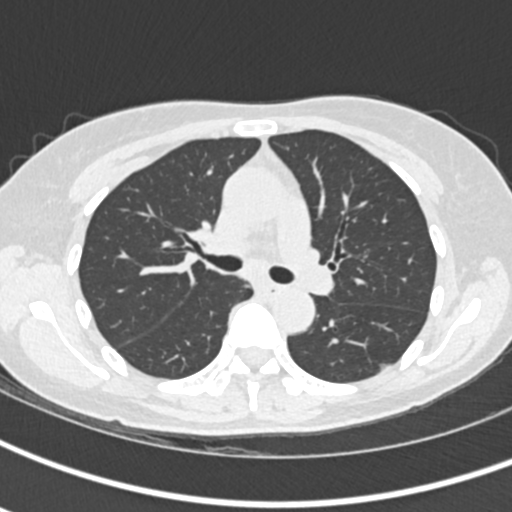
[im 221/331  mediastinal]
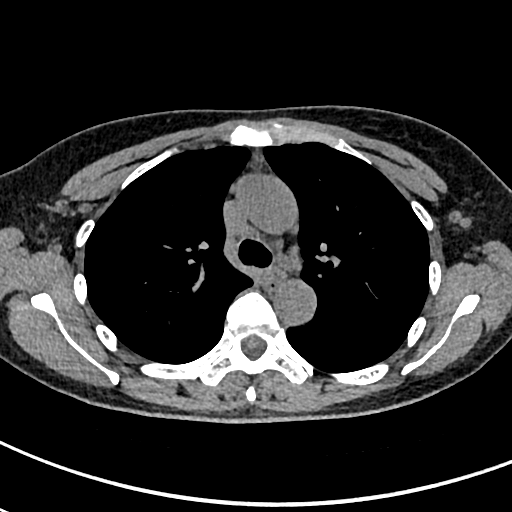
[im 221/331  lung]
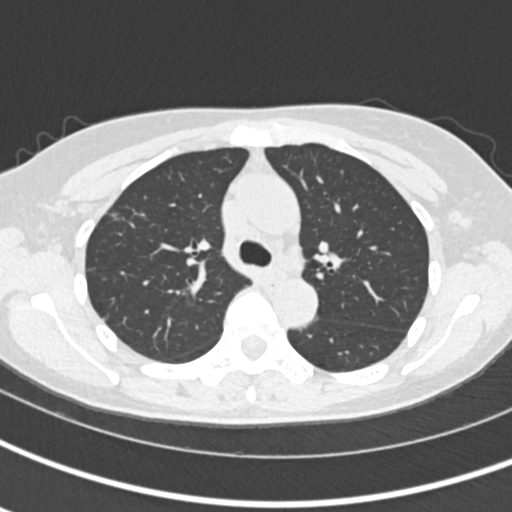
[im 265/331  lung]
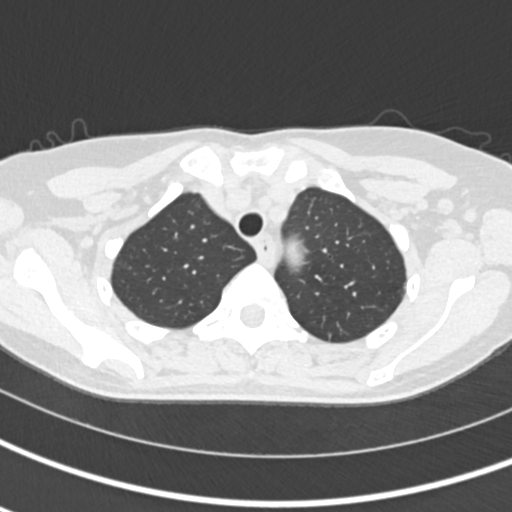
[im 287/331  lung]
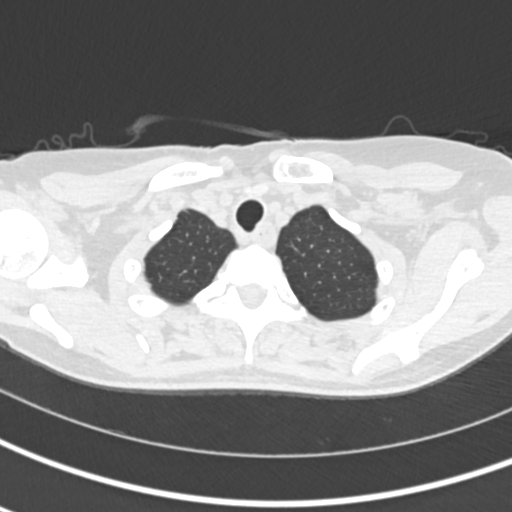
[im 309/331  lung]
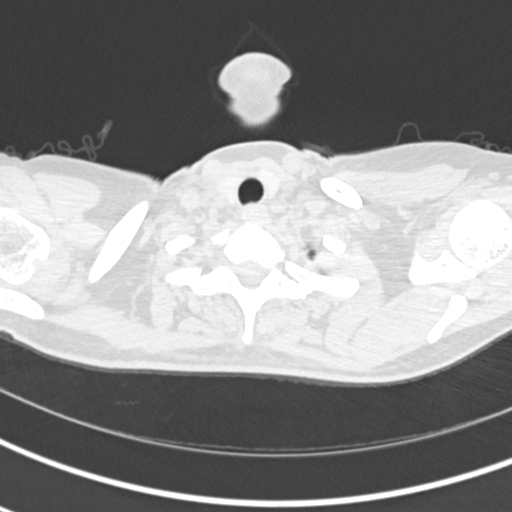

[15 of 36 positions shown; findings below may reference images not displayed]

FINDINGS: Cardiovascular: No significant vascular findings on noncontrast
imaging. The heart size is normal. There is no pericardial effusion.

Mediastinum/Nodes: Multiple prominent to mildly enlarged mediastinal
and bilateral axillary lymph nodes are again noted, similar to
remote CT from 09/28/2017. There is a high right paratracheal node
measuring 7 mm on image [DATE] and a precarinal node measuring 8 mm on
image 49/2. No progressive adenopathy. The thyroid gland, trachea
and esophagus demonstrate no significant findings.

Lungs/Pleura: No pleural effusion or pneumothorax. The previously
demonstrated cavitary subpleural lesion in the right upper lobe has
decreased in size and shows no residual central cavitation. This
measures approximately 12 x 4 mm on image 40/8 and is most
consistent with a focus of resolving infection or inflammation.
There is scattered mild subpleural reticulation in both lungs. No
new or enlarging nodules.

Upper abdomen: The visualized upper abdomen appears stable without
significant findings.

Musculoskeletal/Chest wall: There is no chest wall mass or
suspicious osseous finding.
IMPRESSION: 1. Interval decreased size of cavitary subpleural lesion in the
right upper lobe, most consistent with a focus of resolving
infection or inflammation. Additional follow-up CT in 2-3 months
recommended to document complete resolution.
2. Prominent to mildly enlarged mediastinal and bilateral axillary
lymph nodes, similar to remote CT from 09/28/2017, likely reactive
or secondary to chronic inflammation.
3. No acute chest findings.

## 2021-10-05 ENCOUNTER — Other Ambulatory Visit: Payer: Self-pay

## 2021-10-05 ENCOUNTER — Ambulatory Visit
Admission: RE | Admit: 2021-10-05 | Discharge: 2021-10-05 | Disposition: A | Discharge status: Home / Self Care | Payer: Medicare Other | Source: Ambulatory Visit | Attending: Emergency Medicine | Admitting: Emergency Medicine

## 2021-10-05 DIAGNOSIS — R911 Solitary pulmonary nodule: Secondary | ICD-10-CM

## 2022-05-06 ENCOUNTER — Encounter: Payer: Self-pay | Admitting: Emergency Medicine

## 2022-05-06 ENCOUNTER — Ambulatory Visit
Admission: EM | Admit: 2022-05-06 | Discharge: 2022-05-06 | Disposition: A | Discharge status: Home / Self Care | Payer: Medicare Other | Attending: Internal Medicine | Admitting: Internal Medicine

## 2022-05-06 DIAGNOSIS — L237 Allergic contact dermatitis due to plants, except food: Secondary | ICD-10-CM

## 2022-05-06 MED ORDER — METHYLPREDNISOLONE SODIUM SUCC 125 MG IJ SOLR
80.0000 mg | Freq: Once | INTRAMUSCULAR | Status: AC
  Administered 2022-05-06: 80 mg via INTRAMUSCULAR

## 2022-05-06 NOTE — Discharge Instructions (Signed)
You were given a steroid injection today in urgent care to help alleviate the rash.  Please follow-up with your primary care doctor if symptoms persist or worsen.

## 2022-05-06 NOTE — ED Triage Notes (Addendum)
Pt is present today with poison ivy breakout on her face and hands. Pt sx started Friday

## 2022-05-06 NOTE — ED Provider Notes (Signed)
[facility]    CSN: [id number] Arrival date & time: 05/06/22  5784      History   Chief Complaint Chief Complaint  Patient presents with   Poison Ivy    HPI Kathleen Costa is a 56 y.o. female.   Patient presents with itchy rash to face, neck, hands that started about 2 days ago.  Patient is attributing rash to poison ivy as she has been working out in the yard.  She has not taken any medications other than a topical cream that she is not sure the name of that was minimally helpful.  Denies any associated fever.  Denies any feelings of throat closing or shortness of breath.   Poison Ivy    Past Medical History:  Diagnosis Date   Adenomyomatosis of gallbladder 10/2017   Abd U/S   Arthritis    Disturbance of skin sensation    Headache    hx of   Lupus (HCC)    Migraine without aura, without mention of intractable migraine without mention of status migrainosus 04/22/2014   Rheumatoid arthritis [facility])     Patient Active Problem List   Diagnosis Date Noted   Pulmonary nodule 09/07/2020   Skin rash 03/23/2020   Lactic acidosis 03/23/2020   Odynophagia    Esophageal dysphagia    Acute metabolic encephalopathy 69/62/9528   Myalgia 10/01/2017   Polyarthritis 10/01/2017   Paresthesias 10/01/2017   Acute hypoxemic respiratory failure ([place]) 10/01/2017   Thickening of wall of gallbladder 10/01/2017   Elevated liver enzymes 10/01/2017   Hypokalemia 10/01/2017   Lymphadenopathy 09/28/2017   Common migraine with intractable migraine 04/22/2014    Past Surgical History:  Procedure Laterality Date   CARPAL TUNNEL RELEASE Right     OB History   No obstetric history on file.      Home Medications    Prior to Admission medications   Medication Sig Start Date End Date Taking? Authorizing Provider  acetaminophen (TYLENOL) 500 MG tablet Take by mouth.    [provider]  amitriptyline (ELAVIL) 25 MG tablet Take 25 mg by mouth at bedtime.     [provider]  Ascorbic Acid (VITAMIN C) 1000 MG tablet Take 1,000 mg by mouth daily.    [provider]  Cholecalciferol 25 MCG (1000 UT) tablet Take by mouth daily.    [provider]  eletriptan (RELPAX) 40 MG tablet Take 40 mg by mouth 2 (two) times daily. May repeat in 2 hours if headache persists or recurs.    [provider]  hydrocortisone 2.5 % ointment Apply topically daily. 04/06/20   [provider]  hydroxychloroquine (PLAQUENIL) 200 MG tablet Take 200 mg by mouth daily.    [provider]  MYCOPHENOLATE MOFETIL PO Take 1,000 mg by mouth in the morning and at bedtime.    [provider]  oxyCODONE (OXY IR/ROXICODONE) 5 MG immediate release tablet Take 5 mg by mouth 4 (four) times daily as needed. 04/06/20   [provider]  pantoprazole (PROTONIX) 40 MG tablet Take 40 mg by mouth daily. 04/06/20   [provider]  predniSONE (DELTASONE) 10 MG tablet Take by mouth. 04/06/20   [provider]  Vitamin D, Ergocalciferol, (DRISDOL) 1.25 MG (50000 UNIT) CAPS capsule Take 50,000 Units by mouth once a week. 04/06/20   [provider]    Family History Family History  Problem Relation Age of Onset   Migraines Mother    Migraines Sister  Dementia Brother    Migraines Brother    Migraines Brother    Cancer - Lung Brother    Colon polyps Neg Hx    Colon cancer Neg Hx    Esophageal cancer Neg Hx    Rectal cancer Neg Hx    Stomach cancer Neg Hx     Social History Social History   Tobacco Use   Smoking status: Never   Smokeless tobacco: Never  Vaping Use   Vaping Use: Never used  Substance Use Topics   Alcohol use: No   Drug use: No     Allergies   Patient has no known allergies.   Review of Systems Review of Systems Per HPI  Physical Exam Triage Vital Signs ED Triage Vitals [05/06/22 1101]  Enc Vitals Group     BP (P) 132/65     Pulse Rate (P) 76     Resp (P) 18      Temp      Temp src      SpO2 (P) 97 %     Weight      Height      Head Circumference      Peak Flow      Pain Score 0     Pain Loc      Pain Edu?      Excl. in [place]?    No data found.  Updated Vital Signs BP (P) 132/65   Pulse (P) 76   Resp (P) 18   SpO2 (P) 97%   Visual Acuity Right Eye Distance:   Left Eye Distance:   Bilateral Distance:    Right Eye Near:   Left Eye Near:    Bilateral Near:     Physical Exam Constitutional:      General: She is not in acute distress.    Appearance: Normal appearance. She is not toxic-appearing or diaphoretic.  HENT:     Head: Normocephalic and atraumatic.  Eyes:     Extraocular Movements: Extraocular movements intact.     Conjunctiva/sclera: Conjunctivae normal.  Pulmonary:     Effort: Pulmonary effort is normal.  Skin:    Comments: Diffuse maculopapular rash present in various areas of neck and bilateral cheeks of face.  No drainage noted.  Neurological:     General: No focal deficit present.     Mental Status: She is alert and oriented to person, place, and time. Mental status is at baseline.  Psychiatric:        Mood and Affect: Mood normal.        Behavior: Behavior normal.        Thought Content: Thought content normal.        Judgment: Judgment normal.      UC Treatments / Results  Labs (all labs ordered are listed, but only abnormal results are displayed) Labs Reviewed - No data to display  EKG   Radiology No results found.  Procedures Procedures (including critical care time)  Medications Ordered in UC Medications  methylPREDNISolone sodium succinate (SOLU-MEDROL) 125 mg/2 mL injection 80 mg (80 mg Intramuscular Given 05/06/22 1150)    Initial Impression / Assessment and Plan / UC Course  I have reviewed the triage vital signs and the nursing notes.  Pertinent labs & imaging results that were available during my care of the patient were reviewed by me and considered in my medical decision making  (see chart for details).     Patient is adamant that she receives steroid injection  given that oral steroids typically do not help with poison ivy rash for her.  Patient already takes 7.5 mg of prednisone daily for her lupus.  Advised patient that it would be best to increase dose of prednisone and create a taper to help alleviate rash but patient declined this.  Will administer IM Solu-Medrol here in urgent care but advised patient of risks.  I do think that this will be safe for patient given that she tolerates steroids very well given history of lupus.  Discussed return precautions.  Patient verbalized understanding and was agreeable with plan. Final Clinical Impressions(s) / UC Diagnoses   Final diagnoses:  Poison ivy dermatitis     Discharge Instructions      You were given a steroid injection today in urgent care to help alleviate the rash.  Please follow-up with your primary care doctor if symptoms persist or worsen.    ED Prescriptions   None    PDMP not reviewed this encounter.   Teodora Medici, [place] 05/06/22 1255

## 2022-08-23 IMAGING — CT CT CHEST W/O CM
1 series · 15 of 34 positions shown, 19 images · non-contrast
Comparison: 10/05/2020, 09/05/2020

CLINICAL DATA: Follow-up cavitary pulmonary nodule



[Series 2: chest w/(date) · axial · 0.59mm/px · z∈[-278,-26]mm · 15 of 150 slices shown, 19 images]
[im 12/150  mediastinal]
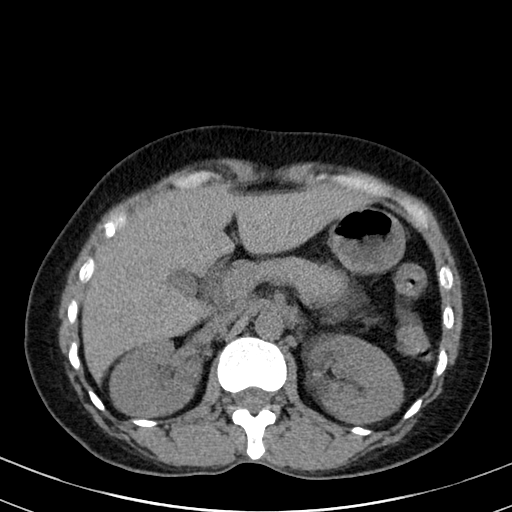
[im 12/150  lung]
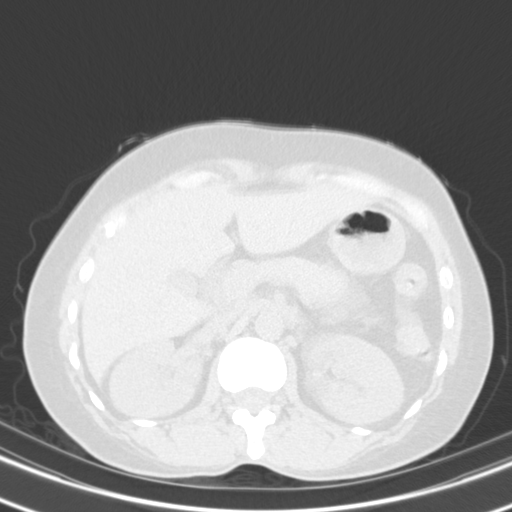
[im 23/150  lung]
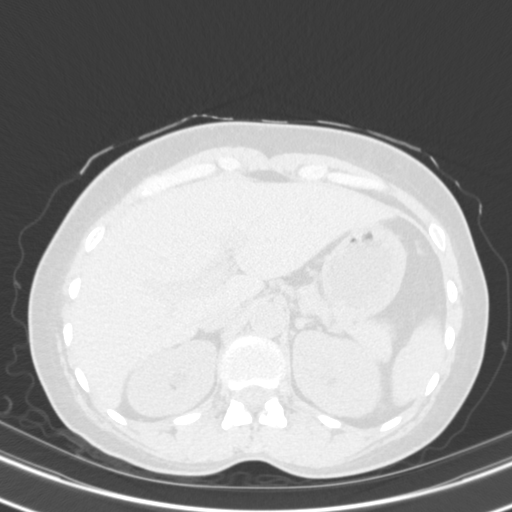
[im 30/150  lung]
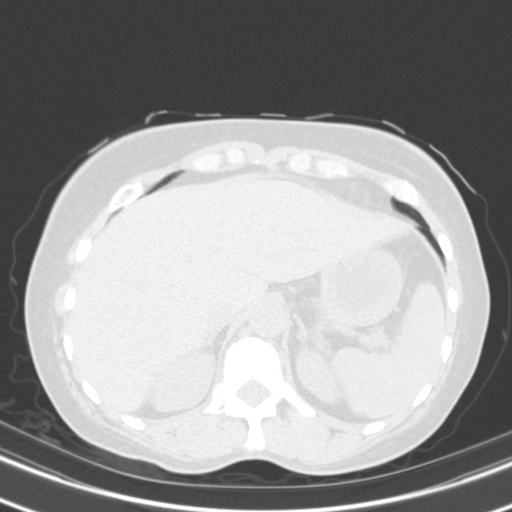
[im 39/150  lung]
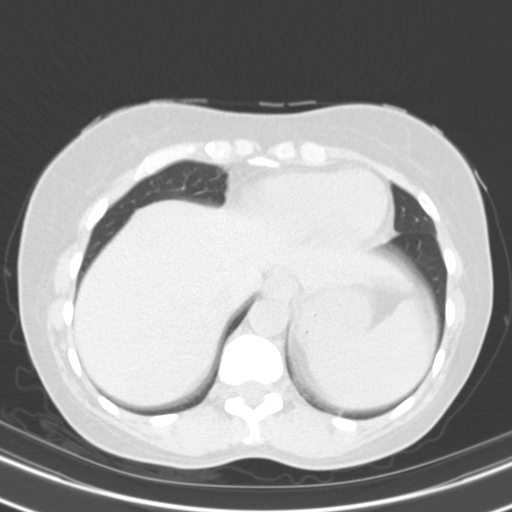
[im 50/150  mediastinal]
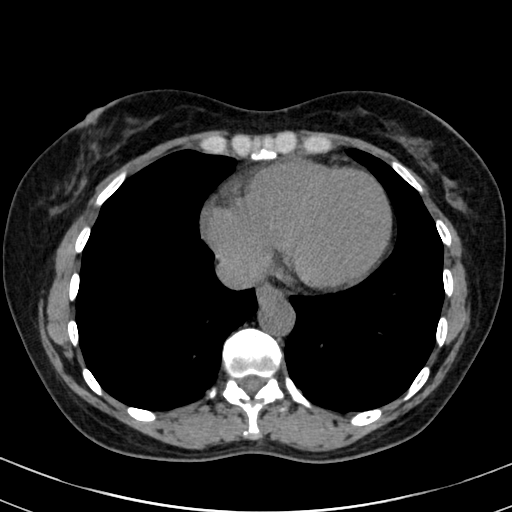
[im 50/150  lung]
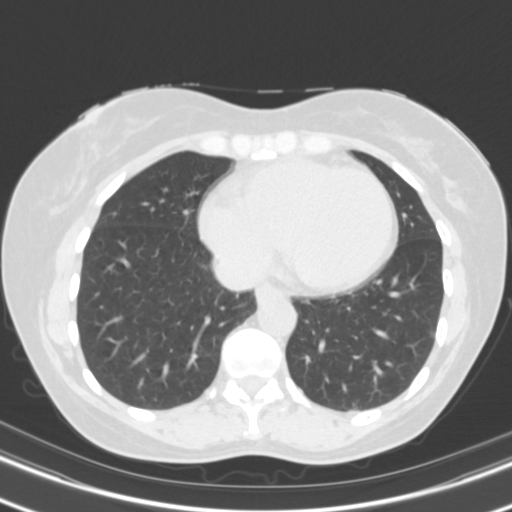
[im 60/150  lung]
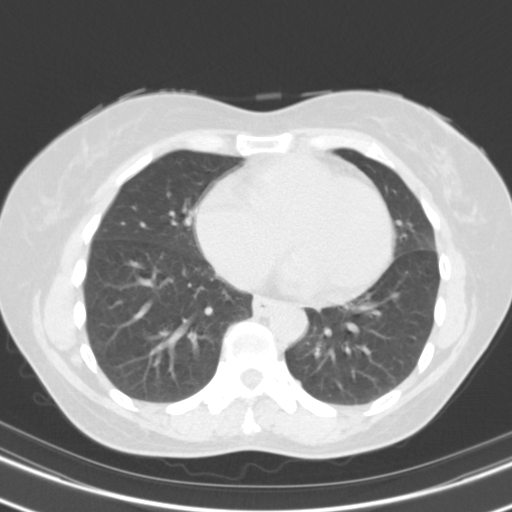
[im 67/150  lung]
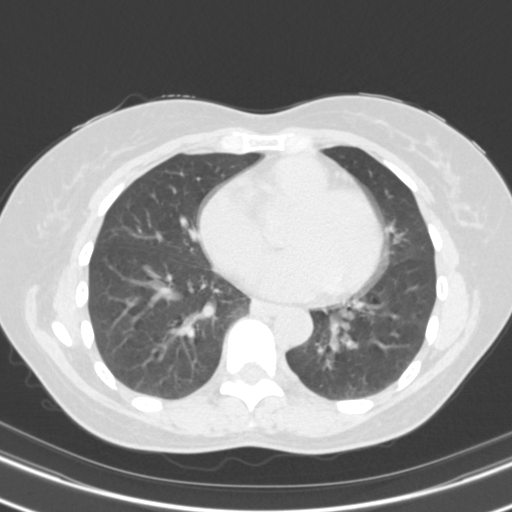
[im 78/150  lung]
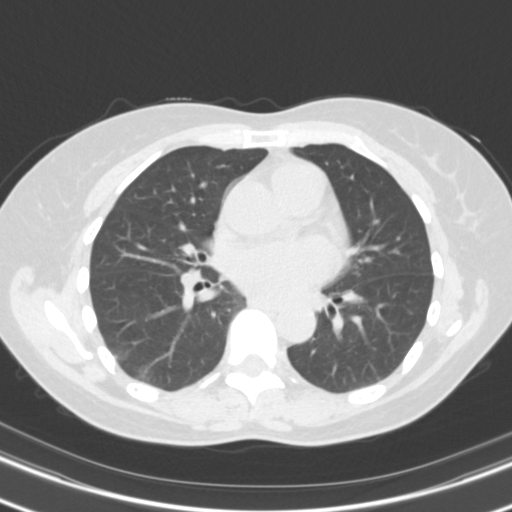
[im 83/150  mediastinal]
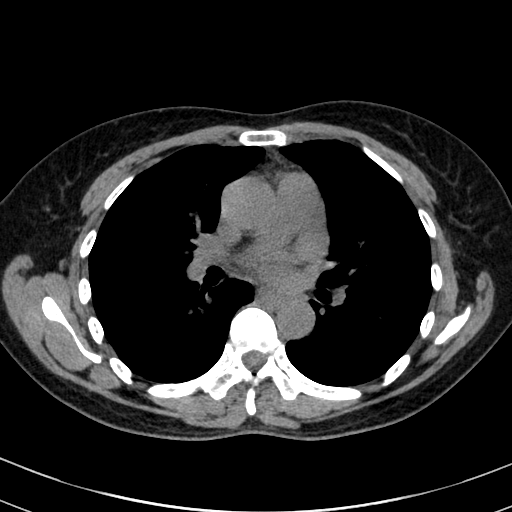
[im 83/150  lung]
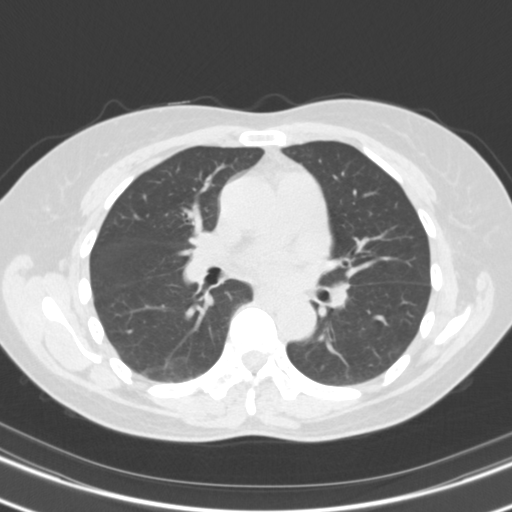
[im 90/150  lung]
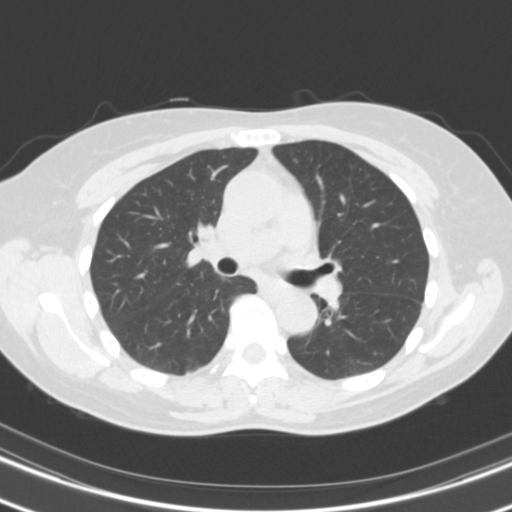
[im 100/150  lung]
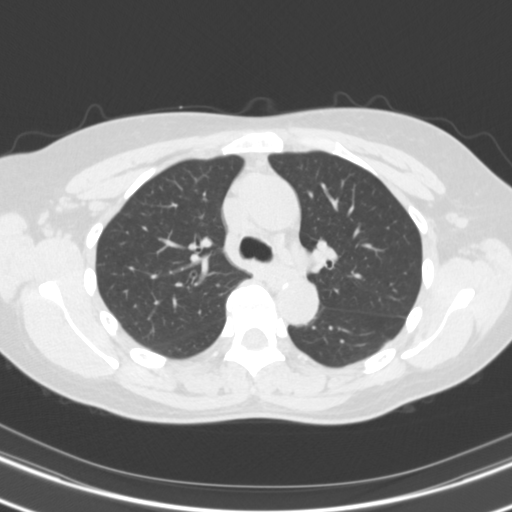
[im 111/150  lung]
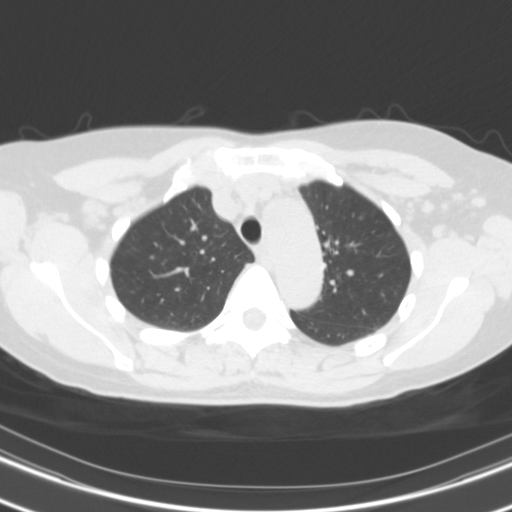
[im 120/150  mediastinal]
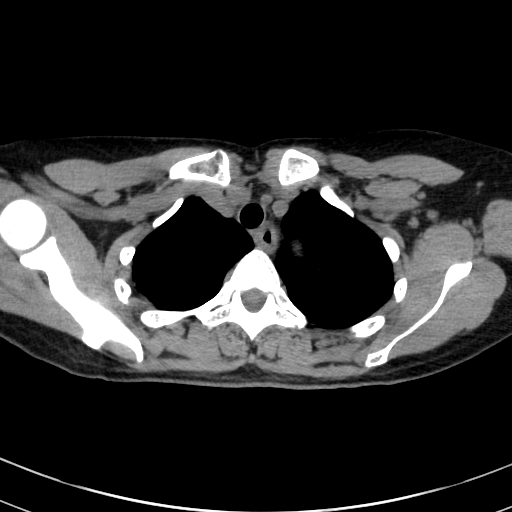
[im 120/150  lung]
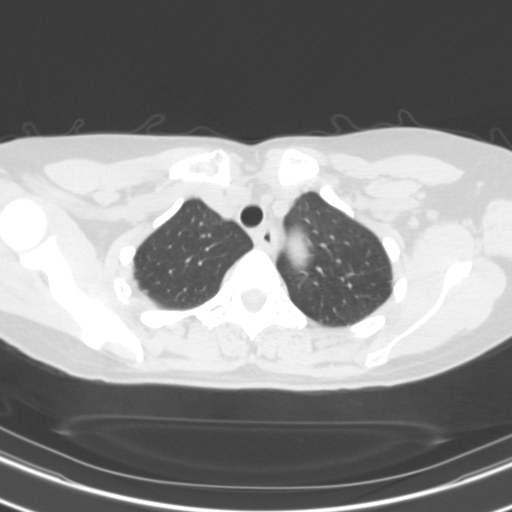
[im 127/150  lung]
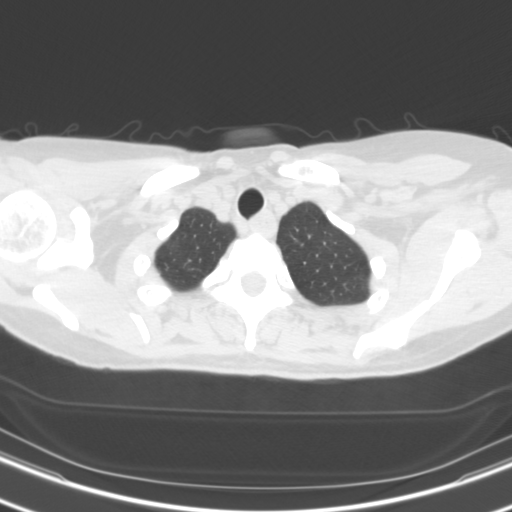
[im 138/150  lung]
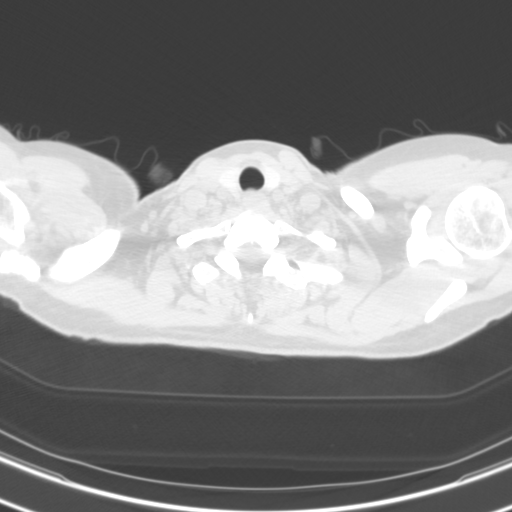

[15 of 34 positions shown; findings below may reference images not displayed]

FINDINGS: Cardiovascular: Scattered aortic atherosclerosis. Normal heart size.
No pericardial effusion.

Mediastinum/Nodes: Unchanged, diffusely prominent bilateral
axillary, subpectoral, and mediastinal lymph nodes. Thyroid gland,
trachea, and esophagus demonstrate no significant findings.

Lungs/Pleura: Essentially complete resolution of a previously seen
nodule of the subpleural anterior right upper lobe, now with only
bandlike residual scarring (series 3, image 48). No pleural effusion
or pneumothorax.

Upper Abdomen: No acute abnormality.

Musculoskeletal: No chest wall abnormality. No suspicious osseous
lesions identified.
IMPRESSION: 1. Essentially complete resolution of a previously seen nodule of
the subpleural anterior right upper lobe, now with only bandlike
residual scarring. Findings are consistent with resolved infection
or inflammation, with no further follow-up required.
2. Unchanged, diffusely prominent bilateral axillary, subpectoral,
and mediastinal lymph nodes, most likely reactive given stability
over time.

Aortic Atherosclerosis ([id number]).

## 2024-06-09 NOTE — Progress Notes (Signed)
 ------------------------------------------------------------------------------- Attestation signed by Cathryne Nat Molt, MD at 06/09/2024  4:12 PM I saw and evaluated the patient and reviewed the resident's/fellow's note. I agree with the resident's/fellow's findings and plan.   Class V LN in remission. Reviewed recent rheumatology labs.  Electronically signed by: Cathryne Nat Molt, MD 06/09/2024 4:12 PM   -------------------------------------------------------------------------------  Nephrology Return Visit - CKD  PCP:  Glendia Freeman, MD  Renal Problem List: No specialty comments available.   CHIEF COMPLAINT: f/u CKD  INTERVAL HISTORY:   Returns for follow up for CKD /class V lupus nephritis . She is doing well here with family. No issues or complaints.  Saw Rheumatology on 04/08/24 has been felt to be in remission Has been on benlysta 200mg  weekly, plaquenil 200mg  daily. Cellcept 1000mg  BID. Not on chronic steroids at this time.  Labs at that time with no proterniuira, GFR >90 Labs on 9/18 no proteinuria   No recent infections or antibiotics No hematuria or foamy urine No chest pains or issues breathing   Home BPs/pulse:   Not Checking  ROS:   CONSTITUTIONAL: denies fevers or chills, denies unintentional weight loss  CARDIOVASCULAR: denies chest pain, denies dyspnea on exertion, denies leg edema  GASTROINTESTINAL: denies nausea, denies vomiting, denies abdominal pain  GENITOURINARY: denies dysuria, denies hematuria, denies decreased urinary stream  All systems reviewed and are negative except as listed above.  BACKGROUND: From initial consultation  Kathleen Costa is a 58 y.o. female with RF negative RA on prednisone  and leflunomide, chronic cholestasis without obstruction who presented to [facility] as a transfer from [facility] on 03/23/2020 for worsening rash, sore throat and dysphagia with concern for an RA flare versus lupus.Per review of records, patient had >300  protein on urinalysis in 03/08/2020, an increase from trace protein on 04/15/2018.  Patient had a normal albumin in January 2021, and was noted to be 1.9 03/08/2020, and was up to 2.3 on 03/14/2020 but now down trended to 1.2 7/28.CRP 15, ESR 105. The patient's daughter was at bedside and talks more for her mother who has difficulty moving her mouth and speaking 2/2 mouth and throat pain.  In January 2021 the patient was able to walk and function at her normal baseline.  They noticed a steep decline in early June 2021 where the patient became fatigued and did not want to participate in hobbies such as daily gardening.  Her appetite steadily decreased as well over the same time period.  She was noted to have elevated LFTs and leflunomide was discontinued and was scheduled to have GI follow-up with an EGD and possible liver biopsy to or MRCP.   Patient developed facial swelling in March 2023, she was convinced it was due to CNI/Cyclosporine because of the increase in Cyclosporine dose from 100mg  per day to 100mg  BID. She refused to continue Cyclosporine. The providers (rheum and nephrol) considered that the facial edema was a manifestation of uncontrolled SLE/acquired C1q est inh deficiency. Decision was made to change IS to iv Cyclophosphamide.    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 12% focal global glomerulosclerosis (3/25). At least mild arteriosclerosis. Mild interstitial fibrosis and tubular atrophy.   -used to follow with Dr. Lynwood Ramsay (rheum) in [place] and was initially diagnosed with seronegative RA -worsening proteinuria leading to nephro referral, had 05/11/21 kidney biopsy showing class V LN (membranous nephropathy)   IS History:  -previous meds: MTX (stopped in  1/20201 due to treatment failure), Arava (stopped in 02/2020 during hospitalization due to elevated  LFTs), s/p Solumedrol and high dose prednisone  during acute SLE flare  -HCQ 200 mg qd (HCQ started 03/25/20), MMF 1000 mg bid (MMF started 03/29/20) -unable to refill MMF in Oct-Nov 2022 due to copay   As of Dec/19/2022: --> took Cyclosporine 100mg  QD for 4 days the week of Dec/12-- and then stopped the medication due to c/o internal burning --> will reattempt lower dose Cyclosporine 100mg  MWF; and recommended to decrease the dose of Prednisone  from 10mg  BID to 10mg  QAM    As of March/10/23:  --> recommended to increase Cyclosporine from 100mg  4 days per week Monday, Wednesday, Friday, Sunday --> to 100mg  BID --> continued Cellcept 1000mg  am / 500mg  pm --> pt had been taking Prednisone  once or twice per week only   As of March 2023: -pt declined continuation of po Cyclosporine due to facial swelling. Providers (rheum and nephrol) did not deem that the manifestation was an allergic reaction to Cyclosporine, as the pt believed -facial swelling was considered to be a manifestation of SLE /acquired C1q est inh deficiency. Decision was made to change IS to iv Cyclophosphamide per [employer] regimen given persisitent proteinuria, abnormal serologies and facial edema on Prednisone , MMF, and low dose Cyclosporine. -recommending to continue Prednisone  10mg , increase MMF from 1000mg  am/500mg  pm to 1000mg  BID until iv Cyclophosphamide is scheduled -once iv Cycloph is scheduled, the plan is for 500mg  q2wks x 3 months followed by MMF + Benlysta; Prednisone  dose adjustment (goal to keep lowest dose possible) based on response to IS   IV Cytoxan 500 mg for 6 treatments (11/24/2021, 12/08/21, 12/22/2021, 01/05/2022, 01/19/22, 02/02/22) Cyclosporine stopped since March Benlysta sq added 12/18/21 MMF on hold while on Cytoxan then resumed 2 weeks after last dose of Cytoxan As of June 2024:  Cytoxan, 6 doses, last dose February 02, 2022 (listed above).  Transitioned to Benlysta weekly and MMF 1.5 gm BID in June.  September MMF  decreased to 2/2.  Maintained on HCQ 200mg  daily and Pred 10mg  day.   December at Rheum f/u Pred 5mg  day, HCQ 200mg  day, MMF 3/2 BID and Benlysta weekly  PERTINENT FAMILY/SOCIAL HISTORY: No known family history of kidney disease   MEDICATIONS: Patient did not bring in medications or list and I updated the list Current Outpatient Medications  Medication Sig Dispense Refill  . acetaminophen  (TYLENOL ) 500 mg tablet Take 1,000 mg by mouth every 6 (six) hours as needed for headaches or fever 100.4 F or GREATER.    SABRA ascorbic acid (VITAMIN C) 1,000 mg tablet Take 1,000 mg by mouth Once Daily.    . belimumab 200 mg/mL atIn Inject 1 mL (200 mg total) into the skin every 7 days. 12 mL 2  . carboxymethylcellulose (REFRESH TEARS) 0.5 % drop ophthalmic solution Administer 1 drop into each eyes 4 (four) times a day. (Patient not taking: Reported on 04/08/2024) 60 mL 0  . cholecalciferol (VITAMIN D3) 1,000 unit (25 mcg) tablet Take 1,000 Units by mouth Once Daily.    . clobetasoL (TEMOVATE) 0.05 % cream Apply 1 Application topically 2 (two) times a day as needed (poison ivy rash). 15 g 2  . diclofenac sodium (VOLTAREN) 1 % gel 1 Application.    . hydrocortisone  2.5 % ointment Apply  topically 2 (two) times a day. 40 g 0  . hydroxychloroquine (PLAQUENIL) 200 mg tablet Take 1 tablet (200 mg total) by mouth daily. 90 tablet 1  .  losartan (COZAAR) 25 mg tablet Take 12.5 mg by mouth nightly. 45 tablet 3  . mycophenolate (CELLCEPT) 500 mg tablet Take 2 tablets (1,000 mg total) by mouth 2 (two) times a day. 360 tablet 0  . pantoprazole  (PROTONIX ) 40 mg EC tablet Take 40 mg by mouth Once Daily for 30 days. (Patient not taking: Reported on 04/08/2024) 30 tablet 0  . triamcinolone (KENALOG) 0.5 % cream APPLY TOPICALLY TO THE AFFECTED AREA TWICE DAILY AS NEEDED     No current facility-administered medications for this visit.     PHYSICAL EXAM: Vitals:   06/09/24 0943  BP: (!) 110/51  Pulse: 79  Temp: 97.4 F  (36.3 C)  SpO2: 100%    CONSTITUTIONAL: Alert, well appearing, no distress CARDIOVASCULAR: Regular, normal S1/S2 heart sounds, no murmurs, no rubs, no lower extremity edema bilaterally PULM: Clear to auscultation bilaterally, normal respiratory effort GASTROINTESTINAL: Soft, active bowel sounds, nontender SKIN: No rash, no jaundice, no ecchymoses DIALYSIS ACCESS: None  STUDIES: ULTRASOUND OF ABDOMEN, 03/25/2020 6:23 PM INDICATION:evaluation of the liver and kidneys - elevated liver enzymes and proteinuria \ N06.9 Isolated proteinuria with morphologic lesion  COMPARISON: Abdominal radiograph dated 03/24/2020. FINDINGS: .  Vascular: The inferior vena cava is patent. The visualized aorta is unremarkable. .  Pancreas: Incompletely imaged. Visualized portions are unremarkable. .  Liver: The liver measures 15.1 cm in its craniocaudal dimension. Normal size, contour, and echotexture. No focal lesions. The portal and hepatic venous systems are patent with antegrade flow. .  Gallbladder: The gallbladder is nondistended with cholelithiasis and heterogeneous gallbladder wall thickening (striated appearance). Trace pericholecystic fluid. Negative sonographic Murphy's sign. .  Biliary: The maximum caliber of the visualized common bile duct is 5 mm. No intrahepatic or extrahepatic ductal dilatation.  .  Kidneys: The right kidney measures 12.5 cm in length. The left kidney measures 11.1 cm in length. Normal contour and echogenicity. No hydronephrosis. Trace perinephric fluid surrounding the right kidney. No focal mass is identified.  SABRA  Spleen: Maximal dimension is 7.9 cm. Trace fluid around the lower pole of the spleen. Normal size. Normal contour and echotexture. No focal lesions.  .  Bladder: Normal. .  Peritoneum: Small volume perisplenic fluid.  CONCLUSION: 1.  Nondistended gallbladder with cholelithiasis. Nonspecific gallbladder wall thickening and trace pericholecystic fluid which may be related to  hypoproteinemia/hypoalbuminemia. No biliary ductal dilation. If there is a particular concern for cholecystitis based on symptoms, nuclear medicine HIDA scan could further evaluate.  2.  No hydronephrosis. 3.  Trace perinephric fluid about the right kidney and free fluid about the lower pole of the spleen.  Labs: Lab Results  Component Value Date   WBC 4.50 04/08/2024   HGB 13.7 04/08/2024   HCT 41.4 04/08/2024   MCV 89.5 04/08/2024   PLT 269 04/08/2024   Lab Results  Component Value Date   NA 140 02/05/2023   K 4.8 02/05/2023   CL 107 02/05/2023   CO2 26 02/05/2023   CREATININE 0.64 04/08/2024   CREATININE 0.63 10/02/2023   CREATININE 0.57 (L) 05/15/2023   ANIONGAP 7 02/05/2023   MG 1.9 02/05/2023   PHOS 4.1 02/05/2023   VITD 35.8 04/08/2024   CALCIUM 9.7 02/05/2023   ALBUMIN 4.5 04/08/2024   GFRNONAA >=90 08/12/2020   Lab Results  Component Value Date   ALBUMIN 4.5 04/08/2024   BILITOT 0.8 04/08/2024   AST 22 04/08/2024   ALT 19 04/08/2024   ALP 125 (H) 04/08/2024   Lab Results  Component Value Date  IRON 50 03/31/2020   FERRITIN 1,342 (H) 03/31/2020   HX B-TYPE NATRIURETIC PEPTIDE  Date Value Ref Range Status  09/21/2020 20.0 5.0 - 100.0 PG/ML Final   No results found for: SPEP, UPEP No results found for: KTLCHNFREE, LTLCHNFREE, KAPLAMRATIO  Urinalysis results: Lab Results  Component Value Date   COLORU Yellow 04/08/2024   CLARITYU Clear 04/08/2024   SPECGRAV 1.025 04/08/2024   PHUR 6.0 04/08/2024   PROTEINUA Negative 04/08/2024   GLUCOSEU Negative 04/08/2024   KETONEU Negative 04/08/2024   BILIRUBINUR Negative 04/08/2024   BLOODU Negative 04/08/2024   NITRITE Negative 04/08/2024   LEUKOUA Trace (A) 04/08/2024   UROBILINOGEN 0.2 04/08/2024   RBCU 0-2 04/08/2024   WBCU 0-5 04/08/2024   BACTERIA Rare 04/08/2024   SQUAMEPICUA 0-5 04/08/2024   No components found for: POCUSG, POCUPRT, POCUGLU, POCUKET, POCUBIL, POCUNON,  POCUHEM, POCUDIP, POCUROB, POCULEU Lab Results  Component Value Date   PROTCRE 71 04/08/2024   PROTCRE 63 10/02/2023   Component     Latest Ref Rng 04/08/2024  C4 Complement     19 - 52 mg/dL 29   C3 Complement     87 - 200 mg/dL 861        IMPRESSION:  1. SLE with proteinuria prior angioedema due to manifestations of SLE/aquired C1q est inhibitoir def, proteinuria has resolved with treatment Following close with rheum On Benlysta 200mg  weekly, plaquenil 200mg  daily Cellcept was reduced to 1000mg  BID  Recent labs in August stable Cr and no proteinuria  Overall appears to remain in remission from lupus nephritis perspective Remains on losartan  a. Urine studies:  9/18 UA overall bland appearing, UPC without proteinuria  04/08/24 UA negative micro no protein on UPC  2. Electrolytes: historically acceptable   3. Mineral bone disease:  Now off chronic steroids Vit D def, currently receiving Vit D Calcium and phos have been historically acceptable DEXA scan in 11/11/20 showed normal BMD   4. Hematology: Normal cbc on 04/08/24  5. Essential HTN: well controlled on losartan  6: Health maintenance:  Flu shot offered today, she declines as has had reactions in past  RECOMMENDATIONS: Overall stable appearing Kidney function and lack of proteinuria on recent labs, remains in remission from nephritis perspective  - given recent labs will hold on repeat today though will want to check electrolytes  with next labs and will place BMP for that to be done - continue losartan, BP well controlled   Disposition: given stability we will plan for follow up interspaced with rheumatology visits. Will plan around May 2025, has appointment 10/07/24 with rheumatology   Problem List Items Addressed This Visit     Lupus nephritis [facility]) - Primary   Relevant Orders   Basic Metabolic Panel      Electronically signed by: Prentice Faes Shammas, DO 06/09/2024 10:23 AM
